# Patient Record
Sex: Male | Born: 1950 | Race: Black or African American | Hispanic: No | Smoking: Former smoker
Health system: Southern US, Community
[De-identification: ages and names within clinical notes are randomized; demographics above are authoritative.]

## PROBLEM LIST (undated history)

## (undated) DIAGNOSIS — Z800 Family history of malignant neoplasm of digestive organs: Secondary | ICD-10-CM

## (undated) DIAGNOSIS — D369 Benign neoplasm, unspecified site: Secondary | ICD-10-CM

## (undated) DIAGNOSIS — F319 Bipolar disorder, unspecified: Secondary | ICD-10-CM

## (undated) DIAGNOSIS — IMO0001 Reserved for inherently not codable concepts without codable children: Secondary | ICD-10-CM

## (undated) DIAGNOSIS — C19 Malignant neoplasm of rectosigmoid junction: Secondary | ICD-10-CM

## (undated) DIAGNOSIS — K5790 Diverticulosis of intestine, part unspecified, without perforation or abscess without bleeding: Secondary | ICD-10-CM

## (undated) DIAGNOSIS — G4730 Sleep apnea, unspecified: Secondary | ICD-10-CM

## (undated) DIAGNOSIS — F419 Anxiety disorder, unspecified: Secondary | ICD-10-CM

## (undated) DIAGNOSIS — I219 Acute myocardial infarction, unspecified: Secondary | ICD-10-CM

## (undated) DIAGNOSIS — E039 Hypothyroidism, unspecified: Secondary | ICD-10-CM

## (undated) DIAGNOSIS — I639 Cerebral infarction, unspecified: Secondary | ICD-10-CM

## (undated) DIAGNOSIS — E119 Type 2 diabetes mellitus without complications: Secondary | ICD-10-CM

## (undated) DIAGNOSIS — E782 Mixed hyperlipidemia: Secondary | ICD-10-CM

## (undated) DIAGNOSIS — M1990 Unspecified osteoarthritis, unspecified site: Secondary | ICD-10-CM

## (undated) DIAGNOSIS — J189 Pneumonia, unspecified organism: Secondary | ICD-10-CM

## (undated) DIAGNOSIS — I10 Essential (primary) hypertension: Secondary | ICD-10-CM

## (undated) DIAGNOSIS — Z87828 Personal history of other (healed) physical injury and trauma: Secondary | ICD-10-CM

## (undated) DIAGNOSIS — M51369 Other intervertebral disc degeneration, lumbar region without mention of lumbar back pain or lower extremity pain: Secondary | ICD-10-CM

## (undated) DIAGNOSIS — Z8673 Personal history of transient ischemic attack (TIA), and cerebral infarction without residual deficits: Secondary | ICD-10-CM

## (undated) DIAGNOSIS — M549 Dorsalgia, unspecified: Secondary | ICD-10-CM

## (undated) DIAGNOSIS — I2510 Atherosclerotic heart disease of native coronary artery without angina pectoris: Secondary | ICD-10-CM

## (undated) DIAGNOSIS — K635 Polyp of colon: Secondary | ICD-10-CM

## (undated) DIAGNOSIS — D126 Benign neoplasm of colon, unspecified: Secondary | ICD-10-CM

## (undated) DIAGNOSIS — M5136 Other intervertebral disc degeneration, lumbar region: Secondary | ICD-10-CM

## (undated) HISTORY — DX: Diverticulosis of intestine, part unspecified, without perforation or abscess without bleeding: K57.90

## (undated) HISTORY — DX: Benign neoplasm, unspecified site: D36.9

## (undated) HISTORY — PX: CARPAL TUNNEL RELEASE: SHX101

## (undated) HISTORY — DX: Mixed hyperlipidemia: E78.2

## (undated) HISTORY — DX: Cerebral infarction, unspecified: I63.9

## (undated) HISTORY — DX: Malignant neoplasm of rectosigmoid junction: C19

## (undated) HISTORY — DX: Polyp of colon: K63.5

## (undated) HISTORY — DX: Essential (primary) hypertension: I10

## (undated) HISTORY — PX: EYE SURGERY: SHX253

## (undated) HISTORY — DX: Hypothyroidism, unspecified: E03.9

## (undated) HISTORY — DX: Personal history of transient ischemic attack (TIA), and cerebral infarction without residual deficits: Z86.73

## (undated) HISTORY — PX: CIRCUMCISION: SUR203

## (undated) HISTORY — DX: Atherosclerotic heart disease of native coronary artery without angina pectoris: I25.10

## (undated) HISTORY — DX: Personal history of other (healed) physical injury and trauma: Z87.828

## (undated) HISTORY — DX: Type 2 diabetes mellitus without complications: E11.9

## (undated) HISTORY — PX: ABDOMINAL SURGERY: SHX537

## (undated) HISTORY — DX: Unspecified osteoarthritis, unspecified site: M19.90

## (undated) HISTORY — DX: Benign neoplasm of colon, unspecified: D12.6

## (undated) HISTORY — DX: Family history of malignant neoplasm of digestive organs: Z80.0

---

## 1989-03-16 DIAGNOSIS — Z8673 Personal history of transient ischemic attack (TIA), and cerebral infarction without residual deficits: Secondary | ICD-10-CM

## 1989-03-16 HISTORY — DX: Personal history of transient ischemic attack (TIA), and cerebral infarction without residual deficits: Z86.73

## 2000-10-28 ENCOUNTER — Emergency Department (HOSPITAL_COMMUNITY): Admission: EM | Admit: 2000-10-28 | Discharge: 2000-10-28 | Payer: Self-pay | Admitting: *Deleted

## 2000-12-24 ENCOUNTER — Ambulatory Visit (HOSPITAL_COMMUNITY): Admission: RE | Admit: 2000-12-24 | Discharge: 2000-12-24 | Payer: Self-pay | Admitting: *Deleted

## 2001-05-07 ENCOUNTER — Ambulatory Visit: Admission: RE | Admit: 2001-05-07 | Discharge: 2001-05-07 | Payer: Self-pay | Admitting: Internal Medicine

## 2002-07-31 ENCOUNTER — Inpatient Hospital Stay (HOSPITAL_COMMUNITY): Admission: EM | Admit: 2002-07-31 | Discharge: 2002-08-03 | Payer: Self-pay | Admitting: Internal Medicine

## 2002-07-31 ENCOUNTER — Encounter: Payer: Self-pay | Admitting: Internal Medicine

## 2004-03-30 ENCOUNTER — Encounter (INDEPENDENT_AMBULATORY_CARE_PROVIDER_SITE_OTHER): Payer: Self-pay | Admitting: *Deleted

## 2004-03-30 ENCOUNTER — Ambulatory Visit (HOSPITAL_COMMUNITY): Admission: RE | Admit: 2004-03-30 | Discharge: 2004-03-30 | Payer: Self-pay | Admitting: *Deleted

## 2004-03-30 HISTORY — PX: COLONOSCOPY: SHX174

## 2004-08-13 ENCOUNTER — Emergency Department (HOSPITAL_COMMUNITY): Admission: EM | Admit: 2004-08-13 | Discharge: 2004-08-13 | Payer: Self-pay | Admitting: Emergency Medicine

## 2004-09-17 ENCOUNTER — Encounter: Admission: RE | Admit: 2004-09-17 | Discharge: 2004-09-17 | Payer: Self-pay | Admitting: Orthopedic Surgery

## 2005-07-02 ENCOUNTER — Observation Stay (HOSPITAL_COMMUNITY): Admission: EM | Admit: 2005-07-02 | Discharge: 2005-07-03 | Payer: Self-pay | Admitting: Emergency Medicine

## 2005-10-26 ENCOUNTER — Emergency Department (HOSPITAL_COMMUNITY): Admission: EM | Admit: 2005-10-26 | Discharge: 2005-10-26 | Payer: Self-pay | Admitting: Emergency Medicine

## 2006-04-01 ENCOUNTER — Ambulatory Visit (HOSPITAL_COMMUNITY): Admission: RE | Admit: 2006-04-01 | Discharge: 2006-04-01 | Payer: Self-pay | Admitting: *Deleted

## 2006-04-01 ENCOUNTER — Encounter (INDEPENDENT_AMBULATORY_CARE_PROVIDER_SITE_OTHER): Payer: Self-pay | Admitting: *Deleted

## 2007-12-08 ENCOUNTER — Emergency Department (HOSPITAL_COMMUNITY): Admission: EM | Admit: 2007-12-08 | Discharge: 2007-12-08 | Payer: Self-pay | Admitting: Emergency Medicine

## 2008-06-27 ENCOUNTER — Emergency Department (HOSPITAL_COMMUNITY): Admission: EM | Admit: 2008-06-27 | Discharge: 2008-06-27 | Payer: Self-pay | Admitting: Emergency Medicine

## 2008-09-17 ENCOUNTER — Emergency Department (HOSPITAL_COMMUNITY): Admission: EM | Admit: 2008-09-17 | Discharge: 2008-09-17 | Payer: Self-pay | Admitting: Emergency Medicine

## 2008-11-29 ENCOUNTER — Emergency Department (HOSPITAL_COMMUNITY): Admission: EM | Admit: 2008-11-29 | Discharge: 2008-11-29 | Payer: Self-pay | Admitting: Emergency Medicine

## 2009-07-14 ENCOUNTER — Ambulatory Visit (HOSPITAL_COMMUNITY): Admission: RE | Admit: 2009-07-14 | Discharge: 2009-07-14 | Payer: Self-pay | Admitting: Ophthalmology

## 2009-09-24 ENCOUNTER — Emergency Department (HOSPITAL_COMMUNITY): Admission: EM | Admit: 2009-09-24 | Discharge: 2009-09-24 | Payer: Self-pay | Admitting: Emergency Medicine

## 2009-10-12 ENCOUNTER — Emergency Department (HOSPITAL_COMMUNITY): Admission: EM | Admit: 2009-10-12 | Discharge: 2009-10-12 | Payer: Self-pay | Admitting: Emergency Medicine

## 2009-12-24 ENCOUNTER — Emergency Department (HOSPITAL_COMMUNITY): Admission: EM | Admit: 2009-12-24 | Discharge: 2009-12-24 | Payer: Self-pay | Admitting: Emergency Medicine

## 2010-01-19 ENCOUNTER — Emergency Department (HOSPITAL_COMMUNITY): Admission: EM | Admit: 2010-01-19 | Discharge: 2010-01-19 | Payer: Self-pay | Admitting: Emergency Medicine

## 2010-07-31 ENCOUNTER — Emergency Department (HOSPITAL_COMMUNITY)
Admission: EM | Admit: 2010-07-31 | Discharge: 2010-07-31 | Payer: Self-pay | Source: Home / Self Care | Admitting: Emergency Medicine

## 2010-08-02 LAB — GLUCOSE, CAPILLARY: Glucose-Capillary: 306 mg/dL — ABNORMAL HIGH (ref 70–99)

## 2010-10-16 LAB — BASIC METABOLIC PANEL
BUN: 7 mg/dL (ref 6–23)
CO2: 25 mEq/L (ref 19–32)
Calcium: 8.8 mg/dL (ref 8.4–10.5)
Chloride: 102 mEq/L (ref 96–112)
Creatinine, Ser: 1 mg/dL (ref 0.4–1.5)
GFR calc Af Amer: >60 mL/min (ref >60)
GFR calc non Af Amer: >60 mL/min (ref >60)
Glucose, Bld: 236 mg/dL — ABNORMAL HIGH (ref 70–99)
Potassium: 3.7 mEq/L (ref 3.5–5.1)
Sodium: 136 mEq/L (ref 135–145)

## 2010-10-16 LAB — CBC
HCT: 42.8 % (ref 39.0–52.0)
Hemoglobin: 14.4 g/dL (ref 13.0–17.0)
MCHC: 33.5 g/dL (ref 30.0–36.0)
MCV: 84.3 fL (ref 78.0–100.0)
Platelets: 231 10*3/uL (ref 150–400)
RBC: 5.08 MIL/uL (ref 4.22–5.81)
RDW: 13.8 % (ref 11.5–15.5)
WBC: 6.3 10*3/uL (ref 4.0–10.5)

## 2010-10-16 LAB — GLUCOSE, CAPILLARY: Glucose-Capillary: 174 mg/dL — ABNORMAL HIGH (ref 70–99)

## 2010-10-26 LAB — URINALYSIS, ROUTINE W REFLEX MICROSCOPIC
Bilirubin Urine: NEGATIVE
Glucose, UA: >1000 mg/dL — AB
Hgb urine dipstick: NEGATIVE
Ketones, ur: NEGATIVE mg/dL
Leukocytes, UA: NEGATIVE
Nitrite: NEGATIVE
Protein, ur: NEGATIVE mg/dL
Specific Gravity, Urine: 1.01 (ref 1.005–1.030)
Urobilinogen, UA: 0.2 mg/dL (ref 0.0–1.0)
pH: 5.5 (ref 5.0–8.0)

## 2010-10-26 LAB — GLUCOSE, CAPILLARY: Glucose-Capillary: 332 mg/dL — ABNORMAL HIGH (ref 70–99)

## 2010-10-26 LAB — POCT CARDIAC MARKERS
CKMB, poc: <1 ng/mL — ABNORMAL LOW (ref 1.0–8.0)
Myoglobin, poc: 73.9 ng/mL (ref 12–200)
Troponin i, poc: <0.05 ng/mL (ref 0.00–0.09)

## 2010-10-26 LAB — BASIC METABOLIC PANEL
BUN: 8 mg/dL (ref 6–23)
CO2: 20 mEq/L (ref 19–32)
Calcium: 8.7 mg/dL (ref 8.4–10.5)
Chloride: 106 mEq/L (ref 96–112)
Creatinine, Ser: 0.88 mg/dL (ref 0.4–1.5)
GFR calc Af Amer: >60 mL/min (ref >60)
GFR calc non Af Amer: >60 mL/min (ref >60)
Glucose, Bld: 302 mg/dL — ABNORMAL HIGH (ref 70–99)
Potassium: 3.8 mEq/L (ref 3.5–5.1)
Sodium: 137 mEq/L (ref 135–145)

## 2010-10-26 LAB — DIFFERENTIAL
Basophils Absolute: 0 10*3/uL (ref 0.0–0.1)
Basophils Relative: 0 % (ref 0–1)
Eosinophils Absolute: 0.2 10*3/uL (ref 0.0–0.7)
Eosinophils Relative: 3 % (ref 0–5)
Lymphocytes Relative: 35 % (ref 12–46)
Lymphs Abs: 2.4 10*3/uL (ref 0.7–4.0)
Monocytes Absolute: 0.4 10*3/uL (ref 0.1–1.0)
Monocytes Relative: 6 % (ref 3–12)
Neutro Abs: 3.8 10*3/uL (ref 1.7–7.7)
Neutrophils Relative %: 56 % (ref 43–77)

## 2010-10-26 LAB — CBC
HCT: 41.4 % (ref 39.0–52.0)
Hemoglobin: 14.1 g/dL (ref 13.0–17.0)
MCHC: 34.2 g/dL (ref 30.0–36.0)
MCV: 83.5 fL (ref 78.0–100.0)
Platelets: 219 10*3/uL (ref 150–400)
RBC: 4.96 MIL/uL (ref 4.22–5.81)
RDW: 13.6 % (ref 11.5–15.5)
WBC: 6.8 10*3/uL (ref 4.0–10.5)

## 2010-10-26 LAB — URINE MICROSCOPIC-ADD ON
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
## 2010-12-01 NOTE — Procedures (Signed)
[place]  Patient:    Derrick [name], Derrick [name]                       MRN: [id number] Adm. Date:  [id number] Attending:  Sabino Gasser                           Procedure Report  PROCEDURE:  Upper endoscopy.  INDICATIONS:  GERD.  ANESTHESIA:  Demerol 100 mg, Versed 10 mg.  DESCRIPTION OF PROCEDURE:  With patient mildly sedated in the left lateral decubitus position, the Olympus videoscopic endoscope was inserted in the mouth, passed under direct vision through the esophagus.  Distal esophagus showed an inflammatory polyp.  It was photographed.  There was also a question of Barretts esophagus seen, photographed, and biopsied along with the polyp. We entered into the stomach.  Fundus, body, antrum all visualized.  There was some erythema of the antrum, which was biopsied.  Duodenal bulb, second portion of the duodenum well-visualized.  Those appeared normal.  From this point, the endoscope was slowly withdrawn, taking circumferential views of the entire duodenal mucosa until the endoscope had been pulled back and the stomach placed in retroflexion to view the stomach from below, and the GE junction was widely patent, allowing the endoscope in retroflexed position to be pulled all the way up into the esophagus.  Photograph taken.  The endoscope was then straight passed back into the stomach, straightened, and withdrawn, taking circumferential views of the remaining gastric and esophageal mucosa, which otherwise appeared normal.  Patients vital signs and pulse oximetry remained stable.  The patient tolerated the procedure well without apparent complications.  FINDINGS: 1. Erythema of antrum, biopsied. 2. Inflammatory-appearing polyp of the distal esophagus-proximal stomach,    photographed and biopsied. 3. A question of Barretts esophagus, biopsied.  PLAN:  Await biopsy report.  The patient will call me with results and follow up with me as an outpatient.   Proceed to colonoscopy as planned. DD:  12/24/00 TD:  12/24/00 Job: [id number]

## 2010-12-01 NOTE — Procedures (Signed)
[place]  Patient:    Derrick [name], Derrick [name]                       MRN: [id number] Adm. Date:  [id number] Attending:  Sabino Gasser                           Procedure Report  PROCEDURE:  Colonoscopy with polypectomy and biopsy.  INDICATIONS:  Colon polyps.  ANESTHESIA:  Demerol 25 mg, Versed 12.5 mg extra.  DESCRIPTION OF PROCEDURE:  With patient mildly sedated in the left lateral decubitus position, subsequently rolled to his back, the Olympus videoscopic colonoscope was inserted in the rectum after rectal examination was attempted and passed under direct vision to the cecum, identified by the ileocecal valve and appendiceal orifice, both of which were photographed.  From this point, the colonoscope was slowly withdrawn, taking circumferential views of the entire colonic mucosa, stopping first in the ascending colon just distal to the ileocecal valve, where a number of polyps were seen, photographed, and removed using snare cautery technique or hot biopsy forceps technique.  The tissue was all retrieved for pathology.  After clearing the cecum and ascending colon, the colonoscope was again further withdrawn, stopping only then in the splenic flexure area, where two large polyps were once again seen. Photographs were taken, and these were also removed using hot biopsy forceps technique or snare cautery technique.  All of the tissue was retrieved. Subsequently the next place we stopped was the sigmoid colon, where two adjacent polyps were seen.  They were quite large, well over a centimeter each.  Both were on a stalk, and both were removed using snare cautery technique.  Tissue was retrieved for pathology with a Dutch Quint. The endoscope was then reinserted to this level and slowly withdrawn, taking circumferential views of the remaining colonic mucosa, which otherwise appeared normal in direct and retroflex view.  The endoscope was  then straightened and withdrawn.  The patients vital signs and pulse oximetry remained stable.  The patient tolerated the procedure well without apparent complications.  FINDINGS:  Multiple polyps scattered throughout the colon, from the splenic flexure, the descending colon, and the sigmoid colon area at approximately 60 cm from the anal verge.  PLAN:  Await biopsy report.  Patient will call me for results and follow up with me as an outpatient. DD:  12/24/00 TD:  12/24/00 Job: [id number]

## 2010-12-01 NOTE — Op Note (Signed)
Derrick [name], MIJANGOS NO.:  [id number]   MEDICAL RECORD NO.:  [id number]          PATIENT TYPE:  AMB   LOCATION:  ENDO                         FACILITY:  [facility]   PHYSICIAN:  Georgiana Spinner, M.D.    DATE OF BIRTH:  1950-12-21   DATE OF PROCEDURE:  04/01/2006  DATE OF DISCHARGE:                                 OPERATIVE REPORT   PROCEDURE:  Colonoscopy.   INDICATIONS:  Colon polyps.   ANESTHESIA:  Demerol 50, Versed 5 mg.   PROCEDURE:  With patient mildly sedated in the left lateral decubitus  position, a rectal examination was attempted.  It was unremarkable to my  examination.  Subsequently, the Olympus videoscopic colonoscope was inserted  into the rectum and passed under direct vision to the cecum identified by  the ileocecal valve and appendiceal orifice.  Prep was suboptimal in that  there were areas of solid stool that was tenacious and difficult to wash and  suction but we did the best we could as we withdrew, taking circumferential  views of colonic mucosa as we went through to the rectum, stopping only in  the hepatic flexure area where there were 2 polyps seen.  One was removed  using snare cautery technique, the other with hot biopsy forceps technique,  both with a similar 20/200 blended current.  The endoscope was then, as  noted, withdrawn all the way to the rectum which appeared normal under  direct vision and on retroflex view.  The endoscope was straightened and  withdrawn.  Patient's vital signs, pulse oximeter, remained stable.  Patient  tolerated the procedure well without apparent complications.   FINDINGS:  Polyps as described above.  Await biopsy report.  Patient will  call me for results and follow up with me as an outpatient.           ______________________________  Georgiana Spinner, M.D.     GMO/MEDQ  D:  04/01/2006  T:  04/01/2006  Job:  [id number]
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
## 2010-12-01 NOTE — Op Note (Signed)
NAME:  Derrick [name], Derrick [name] NO.:  [id number]   MEDICAL RECORD NO.:  [id number]                   PATIENT TYPE:  AMB   LOCATION:  ENDO                                 FACILITY:  [facility]   PHYSICIAN:  Georgiana Spinner, M.D.                 DATE OF BIRTH:  August 16, 1950   DATE OF PROCEDURE:  03/30/2004  DATE OF DISCHARGE:                                 OPERATIVE REPORT   PROCEDURE:  Colonoscopy with polypectomy and biopsy.   ENDOSCOPIST:  Georgiana Spinner, M.D.   INDICATIONS:  Polyp.   ANESTHESIA:  Demerol 100 mg, Versed 6 mg.   DESCRIPTION OF PROCEDURE:  With the patient mildly sedated in the left  lateral decubitus position, the Olympus videoscopic colonoscope was inserted  in the rectum and passed under direct vision to the cecum, identified by  ileocecal valve and appendiceal orifice both of which were photographed.  From this point, the colonoscope was slowly withdrawn taking circumferential  views of the colonic mucosa stopping in the descending colon, where a polyp  was seen, photographed and removed, first using snare technique.  However,  it was pulled through without cautery.  Therefore, I used the hot biopsy  forceps after suctioning the polyp into the endoscope trap and tried to burn  this area.  However, it was defective and I then had to pull this and use  the tip of the snare once again to cauterize this tissue.  The endoscope was  then withdrawn all the way to the rectum which appeared normal on  retroflexed and direct view.  The endoscope was straightened and withdrawn.  The patient's vital signs and pulse oximeter remained stable.  The patient  tolerated the procedure well without apparent complications.   FINDINGS:  Somewhat suboptimal prep in that there was liquidy tenacious  brown stool that was difficult to suction but no gross lesions were seen  other than the polyp in the descending colon which was removed as described  above.   PLAN:  1.   Await biopsy report.  2.  The patient will call me for the results and follow up with me as an      outpatient.                                               Georgiana Spinner, M.D.    GMO/MEDQ  D:  03/30/2004  T:  03/30/2004  Job:  [id number]

## 2010-12-28 ENCOUNTER — Emergency Department (HOSPITAL_COMMUNITY)
Admission: EM | Admit: 2010-12-28 | Discharge: 2010-12-28 | Disposition: A | Discharge status: Home / Self Care | Payer: Medicare Other | Attending: Emergency Medicine | Admitting: Emergency Medicine

## 2010-12-28 DIAGNOSIS — Z79899 Other long term (current) drug therapy: Secondary | ICD-10-CM | POA: Insufficient documentation

## 2010-12-28 DIAGNOSIS — M5430 Sciatica, unspecified side: Secondary | ICD-10-CM | POA: Insufficient documentation

## 2010-12-28 DIAGNOSIS — I2510 Atherosclerotic heart disease of native coronary artery without angina pectoris: Secondary | ICD-10-CM | POA: Insufficient documentation

## 2010-12-28 DIAGNOSIS — E039 Hypothyroidism, unspecified: Secondary | ICD-10-CM | POA: Insufficient documentation

## 2010-12-28 DIAGNOSIS — I252 Old myocardial infarction: Secondary | ICD-10-CM | POA: Insufficient documentation

## 2010-12-28 DIAGNOSIS — E119 Type 2 diabetes mellitus without complications: Secondary | ICD-10-CM | POA: Insufficient documentation

## 2010-12-28 DIAGNOSIS — I10 Essential (primary) hypertension: Secondary | ICD-10-CM | POA: Insufficient documentation

## 2011-01-25 ENCOUNTER — Other Ambulatory Visit (HOSPITAL_COMMUNITY): Payer: Self-pay | Admitting: Physical Medicine and Rehabilitation

## 2011-01-25 DIAGNOSIS — R52 Pain, unspecified: Secondary | ICD-10-CM

## 2011-01-29 ENCOUNTER — Ambulatory Visit (HOSPITAL_COMMUNITY): Admission: RE | Admit: 2011-01-29 | Payer: Medicare Other | Source: Ambulatory Visit

## 2011-02-09 ENCOUNTER — Ambulatory Visit (HOSPITAL_COMMUNITY)
Admission: RE | Admit: 2011-02-09 | Discharge: 2011-02-09 | Disposition: A | Discharge status: Home / Self Care | Payer: Medicare Other | Source: Ambulatory Visit | Attending: Physical Medicine and Rehabilitation | Admitting: Physical Medicine and Rehabilitation

## 2011-02-09 DIAGNOSIS — M79609 Pain in unspecified limb: Secondary | ICD-10-CM | POA: Insufficient documentation

## 2011-02-09 DIAGNOSIS — M545 Low back pain, unspecified: Secondary | ICD-10-CM | POA: Insufficient documentation

## 2011-02-09 DIAGNOSIS — R52 Pain, unspecified: Secondary | ICD-10-CM

## 2011-02-09 DIAGNOSIS — M51379 Other intervertebral disc degeneration, lumbosacral region without mention of lumbar back pain or lower extremity pain: Secondary | ICD-10-CM | POA: Insufficient documentation

## 2011-02-09 DIAGNOSIS — M5137 Other intervertebral disc degeneration, lumbosacral region: Secondary | ICD-10-CM | POA: Insufficient documentation

## 2011-02-09 DIAGNOSIS — M5126 Other intervertebral disc displacement, lumbar region: Secondary | ICD-10-CM | POA: Insufficient documentation

## 2011-04-20 LAB — STREP A DNA PROBE: Group A Strep Probe: NEGATIVE

## 2011-04-20 LAB — RAPID STREP SCREEN (MED CTR MEBANE ONLY): Streptococcus, Group A Screen (Direct): NEGATIVE

## 2012-01-28 ENCOUNTER — Emergency Department (HOSPITAL_COMMUNITY)
Admission: EM | Admit: 2012-01-28 | Discharge: 2012-01-28 | Disposition: A | Discharge status: Home / Self Care | Payer: Medicare Other | Attending: Emergency Medicine | Admitting: Emergency Medicine

## 2012-01-28 ENCOUNTER — Encounter (HOSPITAL_COMMUNITY): Payer: Self-pay | Admitting: *Deleted

## 2012-01-28 DIAGNOSIS — W268XXA Contact with other sharp object(s), not elsewhere classified, initial encounter: Secondary | ICD-10-CM | POA: Insufficient documentation

## 2012-01-28 DIAGNOSIS — E079 Disorder of thyroid, unspecified: Secondary | ICD-10-CM | POA: Insufficient documentation

## 2012-01-28 DIAGNOSIS — Y92009 Unspecified place in unspecified non-institutional (private) residence as the place of occurrence of the external cause: Secondary | ICD-10-CM | POA: Insufficient documentation

## 2012-01-28 DIAGNOSIS — S61209A Unspecified open wound of unspecified finger without damage to nail, initial encounter: Secondary | ICD-10-CM | POA: Insufficient documentation

## 2012-01-28 DIAGNOSIS — S61011A Laceration without foreign body of right thumb without damage to nail, initial encounter: Secondary | ICD-10-CM

## 2012-01-28 DIAGNOSIS — S60511A Abrasion of right hand, initial encounter: Secondary | ICD-10-CM

## 2012-01-28 DIAGNOSIS — I10 Essential (primary) hypertension: Secondary | ICD-10-CM | POA: Insufficient documentation

## 2012-01-28 DIAGNOSIS — E119 Type 2 diabetes mellitus without complications: Secondary | ICD-10-CM | POA: Insufficient documentation

## 2012-01-28 HISTORY — DX: Dorsalgia, unspecified: M54.9

## 2012-01-28 LAB — GLUCOSE, CAPILLARY: Glucose-Capillary: 325 mg/dL — ABNORMAL HIGH (ref 70–99)

## 2012-01-28 MED ORDER — HYDROCODONE-ACETAMINOPHEN 5-325 MG PO TABS: 1.0000 | ORAL_TABLET | ORAL | Status: AC | PRN

## 2012-01-28 MED ORDER — LIDOCAINE HCL (PF) 2 % IJ SOLN
10.0000 mL | Freq: Once | INTRAMUSCULAR | Status: AC
  Administered 2012-01-28: 10 mL
  Filled 2012-01-28: qty 10

## 2012-01-28 MED ORDER — CEPHALEXIN 500 MG PO CAPS: 500.0000 mg | ORAL_CAPSULE | Freq: Four times a day (QID) | ORAL | Status: AC

## 2012-01-28 MED ORDER — BACITRACIN-NEOMYCIN-POLYMYXIN 400-5-5000 EX OINT
TOPICAL_OINTMENT | Freq: Once | CUTANEOUS | Status: AC
  Administered 2012-01-28: 1 via TOPICAL
  Filled 2012-01-28: qty 1

## 2012-01-28 NOTE — ED Notes (Signed)
Lac to rt hand with trying to take apart a microwave.

## 2012-01-28 NOTE — ED Notes (Signed)
Pt has a laceration to the base of his right thumb that he cut on a piece of metal on a microwave. Bleeding noted. Pressure dressing applied. Pt able to move his right thumb and feel touch sensation.

## 2012-01-28 NOTE — ED Notes (Signed)
Dressing applied with xeroform, bacitracin and gauze. No bleeding at this time.

## 2012-01-30 NOTE — ED Provider Notes (Signed)
History     CSN: [id number]  Arrival date & time 01/28/12  1711   First MD Initiated Contact with Patient 01/28/12 1737      Chief Complaint  Patient presents with  . Laceration    (Consider location/radiation/quality/duration/timing/severity/associated sxs/prior treatment) Patient is a 61 y.o. male presenting with skin laceration. The history is provided by the patient and a relative.  Laceration  The incident occurred less than 1 hour ago. The laceration is located on the right hand. The laceration is 2 cm in size. The laceration mechanism was a a metal edge (He was cut on the sharp edge of a microwave in his home.). The pain is at a severity of 8/10. The pain is moderate. The pain has been constant since onset. He reports no foreign bodies present. His tetanus status is UTD.    Past Medical History  Diagnosis Date  . Diabetes mellitus   . Hypertension   . Thyroid disease   . Back pain     Past Surgical History  Procedure Date  . Carpal tunnel release     History reviewed. No pertinent family history.  History  Substance Use Topics  . Smoking status: Never Smoker   . Smokeless tobacco: Current User    Types: Snuff  . Alcohol Use: No      Review of Systems  Constitutional: Negative for fever and chills.  HENT: Negative for facial swelling.   Respiratory: Negative for shortness of breath and wheezing.   Skin: Positive for wound.  Neurological: Negative for numbness.    Allergies  Review of patient's allergies indicates no known allergies.  Home Medications   Current Outpatient Rx  Name Route Sig Dispense Refill  . CEPHALEXIN 500 MG PO CAPS Oral Take 1 capsule (500 mg total) by mouth 4 (four) times daily. 20 capsule 0  . HYDROCODONE-ACETAMINOPHEN 5-325 MG PO TABS Oral Take 1 tablet by mouth every 4 (four) hours as needed for pain. 20 tablet 0    BP 129/87  Pulse 92  Temp 98.1 F (36.7 C) (Oral)  Resp 16  Ht 5\' 8"  (1.727 m)  Wt 240 lb (108.863 kg)   BMI 36.49 kg/m2  SpO2 98%  Physical Exam  Constitutional: He is oriented to person, place, and time. He appears well-developed and well-nourished.  HENT:  Head: Normocephalic.  Cardiovascular: Normal rate.   Pulmonary/Chest: Effort normal.  Musculoskeletal: He exhibits tenderness.  Neurological: He is alert and oriented to person, place, and time. No sensory deficit.  Skin: Laceration noted.       2 cm laceration through volar fatpad of right proximal index finger,  Wound with continual slow oozing blood.  He also has a hemostatic deep abrasion which is triangle shaped,  1 cm x 2 cm on his volar right palm.  Pt has FROM of fingers,  Less than 3 sec cap refill in fingers.    ED Course  Procedures (including critical care time)  Labs Reviewed  GLUCOSE, CAPILLARY - Abnormal; Notable for the following:    Glucose-Capillary 325 (*)     All other components within normal limits  LAB REPORT - SCANNED   No results found.   1. Laceration of right thumb   2. Abrasion of right hand    LACERATION REPAIR Performed by: Burgess Amor Authorized by: Burgess Amor Consent: Verbal consent obtained. Risks and benefits: risks, benefits and alternatives were discussed Consent given by: patient Patient identity confirmed: provided demographic data Prepped and Draped in normal  sterile fashion Wound explored  Laceration Location: right index finger Laceration Length: 2 cm  No Foreign Bodies seen or palpated  Anesthesia: digital block Local anesthetic: lidocaine 2% without epinephrine  Anesthetic total:2 ml  Irrigation method: syringe Amount of cleaning: copious with wound cleanser spray and NS Skin closure: ethilon 4-0  Number of sutures: 7  Technique: simple interrupted  Patient tolerance: Patient tolerated the procedure well with no immediate complications.  Abrasion on hand cleansed thoroughly with wound cleanser spray and NS,  Xeroform and sterole 4x4's followed by cling for  padded dressing.  MDM  Pt tolerated well.  Suture removal in 10 days.  Pt placed on keflex since wound was fairly dirty and on the hand.  Pt diabetic.  Discussed need for better blood glucose control,  Pt states his cbg is never below 200.  He is scheduled to see his pcp in several weeks.  Advised close watch for signs of infection in wound.        Burgess Amor, [place] 01/30/12 1652

## 2012-01-31 NOTE — ED Provider Notes (Signed)
Medical screening examination/treatment/procedure(s) were performed by non-physician practitioner and as supervising physician I was immediately available for consultation/collaboration.   Laray Anger, DO 01/31/12 [phone]

## 2012-03-18 ENCOUNTER — Encounter: Payer: Self-pay | Admitting: Urgent Care

## 2012-03-18 ENCOUNTER — Encounter: Payer: Self-pay | Admitting: Specialist

## 2012-03-19 ENCOUNTER — Encounter: Payer: Self-pay | Admitting: Urgent Care

## 2012-03-19 ENCOUNTER — Other Ambulatory Visit: Payer: Self-pay | Admitting: Internal Medicine

## 2012-03-19 ENCOUNTER — Ambulatory Visit (INDEPENDENT_AMBULATORY_CARE_PROVIDER_SITE_OTHER): Payer: Medicare Other | Admitting: Urgent Care

## 2012-03-19 VITALS — BP 145/93 | HR 75 | Temp 97.4°F | Ht 68.0 in | Wt 243.0 lb

## 2012-03-19 DIAGNOSIS — Z86010 Personal history of colonic polyps: Secondary | ICD-10-CM

## 2012-03-19 DIAGNOSIS — E119 Type 2 diabetes mellitus without complications: Secondary | ICD-10-CM

## 2012-03-19 DIAGNOSIS — Z79899 Other long term (current) drug therapy: Secondary | ICD-10-CM | POA: Insufficient documentation

## 2012-03-19 MED ORDER — PEG 3350-KCL-NA BICARB-NACL 420 G PO SOLR: 4000.0000 mL | ORAL | Status: AC

## 2012-03-19 NOTE — Progress Notes (Signed)
Referring Provider:  Dr Berger Primary Care Physician:  Berger, Martin J, MD Primary Gastroenterologist:  Dr. Rourk  Chief Complaint  Patient presents with  . Colonoscopy    HPI:  Derrick [name] is a 61 y.o. male here as a referral from Dr.Berger for surveillance colonoscopy with hx of adenomatous colon polyps.  He believes he had adenomatous colon polyps on colonoscopy in 2004.  Last colonoscopy by Dr. Orr in 2007 showed hyperplastic polyps. He believes he was recommended to have a followup colonoscopy in 3-5 years.   Denies any lower GI symptoms including constipation, diarrhea, rectal bleeding, melena or weight loss.   Denies any upper GI symptoms including heartburn, indigestion, nausea, vomiting, dysphagia, odynophagia or anorexia.  He is diabetic and on insulin. He is taking Plavix for history of coronary artery disease.  Past Medical History  Diagnosis Date  . Diabetes mellitus   . Hypertension   . Thyroid disease   . Back pain   . Arthritis   . Glaucoma   . Heart attack   . Heart disease   . High cholesterol   . Stroke late 1990  . CAD (coronary artery disease)   . Adenomatous polyp of colon     pt thinks 2004? Dr Orr    Past Surgical History  Procedure Date  . Carpal tunnel release     twice  . Eyes   . Cardiac catheterization   . Colonoscopy 03/30/2004    Orr- 2 hyperplastic polyps removed  . Circumcision     Current Outpatient Prescriptions  Medication Sig Dispense Refill  . amLODipine (NORVASC) 5 MG tablet Take 5 mg by mouth daily.       . clonazePAM (KLONOPIN) 1 MG tablet Take 1 mg by mouth 2 (two) times daily as needed.      . clopidogrel (PLAVIX) 75 MG tablet Take 75 mg by mouth daily.      . escitalopram (LEXAPRO) 20 MG tablet Take 20 mg by mouth daily.      . glipiZIDE (GLUCOTROL) 10 MG tablet Take 10 mg by mouth 2 (two) times daily before a meal.      . HYDROcodone-acetaminophen (NORCO) 10-325 MG per tablet Take 1 tablet by mouth every 6 (six) hours as  needed.      . LANTUS 100 UNIT/ML injection Inject 55 Units into the skin at bedtime.       . metFORMIN (GLUCOPHAGE) 1000 MG tablet Take 1,000 mg by mouth daily with breakfast.       . nitroGLYCERIN (NITROLINGUAL) 0.4 MG/SPRAY spray Place 1 spray under the tongue as needed.      . simvastatin (ZOCOR) 40 MG tablet Take 40 mg by mouth every evening.      . valsartan-hydrochlorothiazide (DIOVAN-HCT) 160-12.5 MG per tablet Take 1 tablet by mouth daily.        Allergies as of 03/19/2012  . (No Known Allergies)    Family History:There is no known family history of colorectal carcinoma , liver disease, or inflammatory bowel disease.  Problem Relation Age of Onset  . Diabetes Mother   . Hypertension Mother   . Stroke Brother   . Coronary artery disease Father     History   Social History  . Marital Status: Legally Separated    Spouse Name: N/A    Number of Children: 12  . Years of Education: N/A   Occupational History  . disabled    Social History Main Topics  . Smoking status: Former Smoker --   1.0 packs/day for 20 years    Types: Cigarettes    Quit date: 03/19/1997  . Smokeless tobacco: Current User    Types: Snuff, Chew  . Alcohol Use: No  . Drug Use: No  . Sexually Active: Not on file   Other Topics Concern  . Not on file   Social History Narrative   Lives alone   Review of Systems: Gen: Denies any fever, chills, sweats, anorexia, fatigue, weakness, malaise, weight loss, and sleep disorder CV: Denies chest pain, angina, palpitations, syncope, orthopnea, PND, peripheral edema, and claudication. Resp: Denies dyspnea at rest, dyspnea with exercise, cough, sputum, wheezing, coughing up blood, and pleurisy. GI: Denies vomiting blood, jaundice, and fecal incontinence.   Denies dysphagia or odynophagia. GU : Denies urinary burning, blood in urine, urinary frequency, urinary hesitancy, nocturnal urination, and urinary incontinence. MS: Denies joint pain, limitation of  movement, and swelling, stiffness, low back pain, extremity pain. Denies muscle weakness, cramps, atrophy.  Derm: Denies rash, itching, dry skin, hives, moles, warts, or unhealing ulcers.  Psych: Denies depression, anxiety, memory loss, suicidal ideation, hallucinations, paranoia, and confusion. Heme: Denies bruising, bleeding, and enlarged lymph nodes. Neuro:  Denies any headaches, dizziness, paresthesias. Endo:  Denies any problems with DM, thyroid, adrenal function.  Physical Exam: BP 145/93  Pulse 75  Temp 97.4 F (36.3 C) (Temporal)  Ht 5' 8" (1.727 m)  Wt 243 lb (110.224 kg)  BMI 36.95 kg/m2 No LMP for male patient. General:   Alert,  Well-developed, well-nourished, pleasant and cooperative in NAD.  Accompanied by his daughter today. Head:  Normocephalic and atraumatic. Eyes:  Sclera clear, no icterus.   Conjunctiva pink. Ears:  Normal auditory acuity. Nose:  No deformity, discharge, or lesions. Mouth:  No deformity or lesions,oropharynx pink & moist. Neck:  Supple; no masses or thyromegaly. Lungs:  Clear throughout to auscultation.   No wheezes, crackles, or rhonchi. No acute distress. Heart:  Regular rate and rhythm; no murmurs, clicks, rubs,  or gallops. Abdomen:  Normal bowel sounds.  No bruits.  Soft, non-tender and non-distended without masses, hepatosplenomegaly or hernias noted.  No guarding or rebound tenderness.   Rectal:  Deferred. Msk:  Symmetrical without gross deformities. Normal posture. Pulses:  Normal pulses noted. Extremities:  No edema. Neurologic:  Alert and oriented x4;  grossly normal neurologically. Skin:  Intact without significant lesions or rashes. Lymph Nodes:  No significant cervical adenopathy. Psych:  Alert and cooperative. Normal mood and affect.  

## 2012-03-19 NOTE — Assessment & Plan Note (Addendum)
Patient on Plavix. See above

## 2012-03-19 NOTE — Progress Notes (Signed)
Faxed to PCP

## 2012-03-19 NOTE — Assessment & Plan Note (Signed)
Instructions given. See above.

## 2012-03-19 NOTE — Assessment & Plan Note (Signed)
Derrick [name] is a pleasant 61 y.o. male with history of adenomatous colon polyps in 2004. He is due for surveillance colonoscopy.  Denies any GI symptoms at this time. He is on Plavix for coronary artery disease.  We did discuss the fact that he is at a slightly higher risk of GI bleeding given the fact that he is on Plavix, however the benefits of remaining on the plavix outweigh the risk of bleeding. We discussed the life threatening nature of an embolic event. He agrees to remain on plavix for this procedure. He also understands that a second procedure may be required since he is on plavix if he shows signs of significant bleeding or is in need of significant intervention that cannot be performed while on plavix. Other risks discussed include reaction to the medication, perforation, and infection. He agrees with all the above and consent will be obtained.    Half glucophage & glucotrol the day prior to the colonoscopy (date of prep) 1/2 of your Lantus insulin 27 units the day before your procedure (date of prep) Hold diabetes medications/insulin day of procedure Bring all your medications and/or any insulin to the hospital the day of the procedure. Follow blood sugars, call us or your PCP if any problems.

## 2012-03-19 NOTE — Patient Instructions (Addendum)
Colonoscopy with Dr Jena Gauss Take half of your glucophage & glucotrol the day prior to the colonoscopy (date of prep) Take 1/2 of your Lantus insulin 27 units the day before your procedure (date of prep) Hold diabetes medications/insulin day of procedure Bring all your medications and/or any insulin to the hospital the day of the procedure. Follow blood sugars, call us or your PCP if any problems.

## 2012-04-14 ENCOUNTER — Encounter (HOSPITAL_COMMUNITY): Payer: Self-pay | Admitting: Pharmacy Technician

## 2012-04-16 ENCOUNTER — Encounter (HOSPITAL_COMMUNITY)
Admission: RE | Disposition: A | Discharge status: Home / Self Care | Payer: Self-pay | Source: Ambulatory Visit | Attending: Internal Medicine

## 2012-04-16 ENCOUNTER — Encounter (HOSPITAL_COMMUNITY): Payer: Self-pay | Admitting: *Deleted

## 2012-04-16 ENCOUNTER — Ambulatory Visit (HOSPITAL_COMMUNITY)
Admission: RE | Admit: 2012-04-16 | Discharge: 2012-04-16 | Disposition: A | Discharge status: Home / Self Care | Payer: Medicare Other | Source: Ambulatory Visit | Attending: Internal Medicine | Admitting: Internal Medicine

## 2012-04-16 DIAGNOSIS — Z09 Encounter for follow-up examination after completed treatment for conditions other than malignant neoplasm: Secondary | ICD-10-CM | POA: Insufficient documentation

## 2012-04-16 DIAGNOSIS — D126 Benign neoplasm of colon, unspecified: Secondary | ICD-10-CM | POA: Insufficient documentation

## 2012-04-16 DIAGNOSIS — Z860100 Personal history of colon polyps, unspecified: Secondary | ICD-10-CM | POA: Insufficient documentation

## 2012-04-16 DIAGNOSIS — D369 Benign neoplasm, unspecified site: Secondary | ICD-10-CM

## 2012-04-16 DIAGNOSIS — Z1211 Encounter for screening for malignant neoplasm of colon: Secondary | ICD-10-CM

## 2012-04-16 DIAGNOSIS — Z01812 Encounter for preprocedural laboratory examination: Secondary | ICD-10-CM | POA: Insufficient documentation

## 2012-04-16 DIAGNOSIS — E119 Type 2 diabetes mellitus without complications: Secondary | ICD-10-CM | POA: Insufficient documentation

## 2012-04-16 DIAGNOSIS — E7800 Pure hypercholesterolemia, unspecified: Secondary | ICD-10-CM | POA: Insufficient documentation

## 2012-04-16 DIAGNOSIS — K5730 Diverticulosis of large intestine without perforation or abscess without bleeding: Secondary | ICD-10-CM

## 2012-04-16 DIAGNOSIS — I10 Essential (primary) hypertension: Secondary | ICD-10-CM | POA: Insufficient documentation

## 2012-04-16 HISTORY — PX: COLONOSCOPY: SHX5424

## 2012-04-16 HISTORY — DX: Benign neoplasm, unspecified site: D36.9

## 2012-04-16 SURGERY — COLONOSCOPY
Anesthesia: Moderate Sedation

## 2012-04-16 MED ORDER — SPOT INK MARKER SYRINGE KIT
PACK | SUBMUCOSAL | Status: DC | PRN
  Administered 2012-04-16: 2 mL via SUBMUCOSAL

## 2012-04-16 MED ORDER — MIDAZOLAM HCL 5 MG/5ML IJ SOLN
INTRAMUSCULAR | Status: AC
  Filled 2012-04-16: qty 10

## 2012-04-16 MED ORDER — EPINEPHRINE HCL 1 MG/ML IJ SOLN
INTRAMUSCULAR | Status: DC | PRN
  Administered 2012-04-16: 1.5 mg via SUBCUTANEOUS

## 2012-04-16 MED ORDER — MEPERIDINE HCL 100 MG/ML IJ SOLN
INTRAMUSCULAR | Status: AC
  Filled 2012-04-16: qty 2

## 2012-04-16 MED ORDER — SODIUM CHLORIDE 0.45 % IV SOLN
INTRAVENOUS | Status: DC
  Administered 2012-04-16: 1000 mL via INTRAVENOUS

## 2012-04-16 MED ORDER — STERILE WATER FOR IRRIGATION IR SOLN
Status: DC | PRN
  Administered 2012-04-16: 08:00:00

## 2012-04-16 MED ORDER — MEPERIDINE HCL 100 MG/ML IJ SOLN
INTRAMUSCULAR | Status: DC | PRN
  Administered 2012-04-16: 25 mg via INTRAVENOUS
  Administered 2012-04-16: 25 mg
  Administered 2012-04-16: 25 mg via INTRAVENOUS

## 2012-04-16 MED ORDER — MIDAZOLAM HCL 5 MG/5ML IJ SOLN
INTRAMUSCULAR | Status: DC | PRN
  Administered 2012-04-16: 2 mg via INTRAVENOUS
  Administered 2012-04-16 (×3): 1 mg via INTRAVENOUS

## 2012-04-16 MED ORDER — EPINEPHRINE HCL 0.1 MG/ML IJ SOLN
INTRAMUSCULAR | Status: AC
  Filled 2012-04-16: qty 20

## 2012-04-16 NOTE — Interval H&P Note (Signed)
History and Physical Interval Note:  04/16/2012 7:38 AM  Derrick [name]  has presented today for surgery, with the diagnosis of hx of colon polyps  The various methods of treatment have been discussed with the patient and family. After consideration of risks, benefits and other options for treatment, the patient has consented to  Procedure(s) (LRB) with comments: COLONOSCOPY (N/A) - 7:30am as a surgical intervention .  The patient's history has been reviewed, patient examined, no change in status, stable for surgery.  I have reviewed the patient's chart and labs.  Questions were answered to the patient's satisfaction.     Eula Listen

## 2012-04-16 NOTE — H&P (View-Only) (Signed)
Referring Provider:  Dr Luster Landsberg Primary Care Physician:  Margorie Reeve, MD Primary Gastroenterologist:  Dr. Jena Gauss  Chief Complaint  Patient presents with  . Colonoscopy    HPI:  Derrick [name] is a 61 y.o. male here as a referral from Dr.Berger for surveillance colonoscopy with hx of adenomatous colon polyps.  He believes he had adenomatous colon polyps on colonoscopy in 2004.  Last colonoscopy by Dr. [name] in 2007 showed hyperplastic polyps. He believes he was recommended to have a followup colonoscopy in 3-5 years.   Denies any lower GI symptoms including constipation, diarrhea, rectal bleeding, melena or weight loss.   Denies any upper GI symptoms including heartburn, indigestion, nausea, vomiting, dysphagia, odynophagia or anorexia.  He is diabetic and on insulin. He is taking Plavix for history of coronary artery disease.  Past Medical History  Diagnosis Date  . Diabetes mellitus   . Hypertension   . Thyroid disease   . Back pain   . Arthritis   . Glaucoma   . Heart attack   . Heart disease   . High cholesterol   . Stroke late 1990  . CAD (coronary artery disease)   . Adenomatous polyp of colon     pt thinks 2004? Dr [name]    Past Surgical History  Procedure Date  . Carpal tunnel release     twice  . Eyes   . Cardiac catheterization   . Colonoscopy 03/30/2004    [name]- 2 hyperplastic polyps removed  . Circumcision     Current Outpatient Prescriptions  Medication Sig Dispense Refill  . amLODipine (NORVASC) 5 MG tablet Take 5 mg by mouth daily.       . clonazePAM (KLONOPIN) 1 MG tablet Take 1 mg by mouth 2 (two) times daily as needed.      . clopidogrel (PLAVIX) 75 MG tablet Take 75 mg by mouth daily.      [name] escitalopram (LEXAPRO) 20 MG tablet Take 20 mg by mouth daily.      [name] glipiZIDE (GLUCOTROL) 10 MG tablet Take 10 mg by mouth 2 (two) times daily before a meal.      . HYDROcodone-acetaminophen (NORCO) 10-325 MG per tablet Take 1 tablet by mouth every 6 (six) hours as  needed.      [name] LANTUS 100 UNIT/ML injection Inject 55 Units into the skin at bedtime.       . metFORMIN (GLUCOPHAGE) 1000 MG tablet Take 1,000 mg by mouth daily with breakfast.       . nitroGLYCERIN (NITROLINGUAL) 0.4 MG/SPRAY spray Place 1 spray under the tongue as needed.      . simvastatin (ZOCOR) 40 MG tablet Take 40 mg by mouth every evening.      . valsartan-hydrochlorothiazide (DIOVAN-HCT) 160-12.5 MG per tablet Take 1 tablet by mouth daily.        Allergies as of 03/19/2012  . (No Known Allergies)    Family History:There is no known family history of colorectal carcinoma , liver disease, or inflammatory bowel disease.  Problem Relation Age of Onset  . Diabetes Mother   . Hypertension Mother   . Stroke Brother   . Coronary artery disease Father     History   Social History  . Marital Status: Legally Separated    Spouse Name: N/A    Number of Children: 12  . Years of Education: N/A   Occupational History  . disabled    Social History Main Topics  . Smoking status: Former Smoker --  1.0 packs/day for 20 years    Types: Cigarettes    Quit date: 03/19/1997  . Smokeless tobacco: Current User    Types: Snuff, Chew  . Alcohol Use: No  . Drug Use: No  . Sexually Active: Not on file   Other Topics Concern  . Not on file   Social History Narrative   Lives alone   Review of Systems: Gen: Denies any fever, chills, sweats, anorexia, fatigue, weakness, malaise, weight loss, and sleep disorder CV: Denies chest pain, angina, palpitations, syncope, orthopnea, PND, peripheral edema, and claudication. Resp: Denies dyspnea at rest, dyspnea with exercise, cough, sputum, wheezing, coughing up blood, and pleurisy. GI: Denies vomiting blood, jaundice, and fecal incontinence.   Denies dysphagia or odynophagia. GU : Denies urinary burning, blood in urine, urinary frequency, urinary hesitancy, nocturnal urination, and urinary incontinence. MS: Denies joint pain, limitation of  movement, and swelling, stiffness, low back pain, extremity pain. Denies muscle weakness, cramps, atrophy.  Derm: Denies rash, itching, dry skin, hives, moles, warts, or unhealing ulcers.  Psych: Denies depression, anxiety, memory loss, suicidal ideation, hallucinations, paranoia, and confusion. Heme: Denies bruising, bleeding, and enlarged lymph nodes. Neuro:  Denies any headaches, dizziness, paresthesias. Endo:  Denies any problems with DM, thyroid, adrenal function.  Physical Exam: BP 145/93  Pulse 75  Temp 97.4 F (36.3 C) (Temporal)  Ht 5\' 8"  (1.727 m)  Wt 243 lb (110.224 kg)  BMI 36.95 kg/m2 No LMP for male patient. General:   Alert,  Well-developed, well-nourished, pleasant and cooperative in NAD.  Accompanied by his daughter today. Head:  Normocephalic and atraumatic. Eyes:  Sclera clear, no icterus.   Conjunctiva pink. Ears:  Normal auditory acuity. Nose:  No deformity, discharge, or lesions. Mouth:  No deformity or lesions,oropharynx pink & moist. Neck:  Supple; no masses or thyromegaly. Lungs:  Clear throughout to auscultation.   No wheezes, crackles, or rhonchi. No acute distress. Heart:  Regular rate and rhythm; no murmurs, clicks, rubs,  or gallops. Abdomen:  Normal bowel sounds.  No bruits.  Soft, non-tender and non-distended without masses, hepatosplenomegaly or hernias noted.  No guarding or rebound tenderness.   Rectal:  Deferred. Msk:  Symmetrical without gross deformities. Normal posture. Pulses:  Normal pulses noted. Extremities:  No edema. Neurologic:  Alert and oriented x4;  grossly normal neurologically. Skin:  Intact without significant lesions or rashes. Lymph Nodes:  No significant cervical adenopathy. Psych:  Alert and cooperative. Normal mood and affect.

## 2012-04-16 NOTE — Op Note (Signed)
[facility] [address] [place], [address]   COLONOSCOPY PROCEDURE REPORT  PATIENT: [name], Derrick  MR#:         [id number] BIRTHDATE: [birth year] , 61  yrs. old GENDER: Male ENDOSCOPIST: R.  Roetta Sessions, MD FACP [facility] REFERRED BY:  Dr. Luster Landsberg PROCEDURE DATE:  04/16/2012 PROCEDURE:     colonoscopy with snare polypectomy, hemostasis clipping, tattooing of the lesion  INDICATIONS: History colonic adenoma  INFORMED CONSENT:  The risks, benefits, alternatives and imponderables including but not limited to bleeding, perforation as well as the possibility of a missed lesion have been reviewed.  The potential for biopsy, lesion removal, etc. have also been discussed.  Questions have been answered.  All parties agreeable. Please see the history and physical in the medical record for more information.  MEDICATIONS: Versed 5 mg IV and Demerol 100 mg IV in divided doses.  DESCRIPTION OF PROCEDURE:  After a digital rectal exam was performed, the EC-3890Li ([id number])  colonoscope was advanced from the anus through the rectum and colon to the area of the cecum, ileocecal valve and appendiceal orifice.  The cecum was deeply intubated.  These structures were well-seen and photographed for the record.  From the level of the cecum and ileocecal valve, the scope was slowly and cautiously withdrawn.  The mucosal surfaces were carefully surveyed utilizing scope tip deflection to facilitate fold flattening as needed.  The scope was pulled down into the rectum where a thorough examination including retroflexion was performed.    FINDINGS:  marginal to poor preparation compromised examination. Normal-appearing rectal mucosa.scattered left-sided diverticula. Multiple ascending colon polyps, sessile and pedunculated, the largest being about 1.5 cm in very difficult to access in the vicinity of the hepatic flexure/descending segment. The remainder of colonic mucosa appeared  grossly normal.  THERAPEUTIC / DIAGNOSTIC MANEUVERS PERFORMED:  Multiple osteoporotic these performed, removing the polyps in the descending segment. One polypectomy site was clipped for concern of post polypectomy bleeding. The largest polypectomy site (the site of the most difficult access) did want to bleed a bit.  I did not have good positioning for clip placement of this lesion. In addition, the full extent of this lesion may not have been totally appreciated because of my approach and visualization. I injected a total of 15 cc of 1: 10,000 epinephrine. This lesion was also tattooed. There was good hemostasis achieved.  COMPLICATIONS: none  CECAL WITHDRAWAL TIME:  44 minutes  IMPRESSION:  Colonic diverticulosis. Multiple ascending polyps treated as described above.  RECOMMENDATIONS: Since Plavix was already held by the patient for 5 days (on his own), I recommend he continue on Plavix for 5 more days and resume this medication on October 7.  At a very minimum, the patient will need a short interval (i.e. 3 month) followup colonoscopy pending review of pathology report. No MRI until clip known to have passed.   _______________________________ eSigned:  R. Roetta Sessions, MD FACP [facility] 04/16/2012 9:10 AM   CC:    PATIENT NAME:  Derrick, [name] MR#: [id number]

## 2012-04-18 ENCOUNTER — Encounter: Payer: Self-pay | Admitting: Internal Medicine

## 2012-04-18 NOTE — Progress Notes (Unsigned)
Letter mailed to pt. Darl Pikes, please nic ov in 3 months to set up tcs.

## 2012-04-18 NOTE — Progress Notes (Unsigned)
Per RMR- Send letter to patient.  Send copy of letter with path to referring provider and PCP. Needs TCS in 3 months. Needs one half a day extra clear liquids and 3 ducalax tablets the night before the procedure and 2 enemas the morning of the procedure in addition to standard GoLYTELY prep. Needs office visit with extender to set all this up in about 3 months

## 2012-04-21 ENCOUNTER — Encounter (HOSPITAL_COMMUNITY): Payer: Self-pay | Admitting: Internal Medicine

## 2012-04-21 ENCOUNTER — Encounter (HOSPITAL_COMMUNITY): Payer: Self-pay | Admitting: Pharmacy Technician

## 2012-04-22 NOTE — Progress Notes (Signed)
Reminder in epic to follow up in 3 months to set up TCS °

## 2012-04-23 ENCOUNTER — Encounter (HOSPITAL_COMMUNITY): Payer: Self-pay

## 2012-04-23 ENCOUNTER — Encounter (HOSPITAL_COMMUNITY)
Admission: RE | Admit: 2012-04-23 | Discharge: 2012-04-23 | Disposition: A | Discharge status: Home / Self Care | Payer: Medicare Other | Source: Ambulatory Visit | Attending: Ophthalmology | Admitting: Ophthalmology

## 2012-04-23 ENCOUNTER — Other Ambulatory Visit: Payer: Self-pay

## 2012-04-23 LAB — BASIC METABOLIC PANEL
Chloride: 106 mEq/L (ref 96–112)
GFR calc Af Amer: >90 mL/min (ref >90)
Potassium: 4.1 mEq/L (ref 3.5–5.1)
Sodium: 139 mEq/L (ref 135–145)

## 2012-04-23 LAB — HEMOGLOBIN AND HEMATOCRIT, BLOOD: Hemoglobin: 13.7 g/dL (ref 13.0–17.0)

## 2012-04-23 NOTE — Progress Notes (Signed)
Pt has no working CBG meter @ home so PCP notified and asked to send prescription to [facility]. Nurse states that it will be sent today for pt. Left voice mail on pt's cell phone to make him aware that he would have meter at his pharmacy.

## 2012-04-23 NOTE — Patient Instructions (Addendum)
Your procedure is scheduled on:  Monday, 04/28/12  Report to [facility] at     1100 AM.  Call this number if you have problems the morning of surgery: [phone]   Remember:   Do not eat or drink   After Midnight.  Take these medicines the morning of surgery with A SIP OF WATER: lexapro, diovan, klonopin and norco if needed. Take 1/2 of your usual dose of Lantus the night before. ( 25 units)  Do not wear jewelry, make-up or nail polish.  Do not wear lotions, powders, or perfumes. You may wear deodorant.  Do not bring valuables to the hospital.  Contacts, dentures or bridgework may not be worn into surgery.     Patients discharged the day of surgery will not be allowed to drive home.  Name and phone number of your driver: family  Special Instructions: Use eye drops as directed.   Please read over the following fact sheets that you were given: Pain Booklet, Anesthesia Post-op Instructions and Care and Recovery After Surgery    Cataract Surgery  A cataract is a clouding of the lens of the eye. When a lens becomes cloudy, vision is reduced based on the degree and nature of the clouding. Surgery may be needed to improve vision. Surgery removes the cloudy lens and usually replaces it with a substitute lens (intraocular lens, IOL). LET YOUR EYE DOCTOR KNOW ABOUT:  Allergies to food or medicine.   Medicines taken including herbs, eyedrops, over-the-counter medicines, and creams.   Use of steroids (by mouth or creams).   Previous problems with anesthetics or numbing medicine.   History of bleeding problems or blood clots.   Previous surgery.   Other health problems, including diabetes and kidney problems.   Possibility of pregnancy, if this applies.  RISKS AND COMPLICATIONS  Infection.   Inflammation of the eyeball (endophthalmitis) that can spread to both eyes (sympathetic ophthalmia).   Poor wound healing.   If an IOL is inserted, it can later fall out of proper position. This  is very uncommon.   Clouding of the part of your eye that holds an IOL in place. This is called an "after-cataract." These are uncommon, but easily treated.  BEFORE THE PROCEDURE  Do not eat or drink anything except small amounts of water for 8 to 12 before your surgery, or as directed by your caregiver.   Unless you are told otherwise, continue any eyedrops you have been prescribed.   Talk to your primary caregiver about all other medicines that you take (both prescription and non-prescription). In some cases, you may need to stop or change medicines near the time of your surgery. This is most important if you are taking blood-thinning medicine.Do not stop medicines unless you are told to do so.   Arrange for someone to drive you to and from the procedure.   Do not put contact lenses in either eye on the day of your surgery.  PROCEDURE There is more than one method for safely removing a cataract. Your doctor can explain the differences and help determine which is best for you. Phacoemulsification surgery is the most common form of cataract surgery.  An injection is given behind the eye or eyedrops are given to make this a painless procedure.   A small cut (incision) is made on the edge of the clear, dome-shaped surface that covers the front of the eye (cornea).   A tiny probe is painlessly inserted into the eye. This device gives  off ultrasound waves that soften and break up the cloudy center of the lens. This makes it easier for the cloudy lens to be removed by suction.   An IOL may be implanted.   The normal lens of the eye is covered by a clear capsule. Part of that capsule is intentionally left in the eye to support the IOL.   Your surgeon may or may not use stitches to close the incision.  There are other forms of cataract surgery that require a larger incision and stiches to close the eye. This approach is taken in cases where the doctor feels that the cataract cannot be easily  removed using phacoemulsification. AFTER THE PROCEDURE  When an IOL is implanted, it does not need care. It becomes a permanent part of your eye and cannot be seen or felt.   Your doctor will schedule follow-up exams to check on your progress.   Review your other medicines with your doctor to see which can be resumed after surgery.   Use eyedrops or take medicine as prescribed by your doctor.  Document Released: 06/21/2011 Document Reviewed: 06/18/2011 [facility] Patient Information 2012 [place], [place].  PATIENT INSTRUCTIONS POST-ANESTHESIA  IMMEDIATELY FOLLOWING SURGERY:  Do not drive or operate machinery for the first twenty four hours after surgery.  Do not make any important decisions for twenty four hours after surgery or while taking narcotic pain medications or sedatives.  If you develop intractable nausea and vomiting or a severe headache please notify your doctor immediately.  FOLLOW-UP:  Please make an appointment with your surgeon as instructed. You do not need to follow up with anesthesia unless specifically instructed to do so.  WOUND CARE INSTRUCTIONS (if applicable):  Keep a dry clean dressing on the anesthesia/puncture wound site if there is drainage.  Once the wound has quit draining you may leave it open to air.  Generally you should leave the bandage intact for twenty four hours unless there is drainage.  If the epidural site drains for more than 36-48 hours please call the anesthesia department.  QUESTIONS?:  Please feel free to call your physician or the hospital operator if you have any questions, and they will be happy to assist you.

## 2012-04-25 MED ORDER — PHENYLEPHRINE HCL 2.5 % OP SOLN
OPHTHALMIC | Status: AC
  Filled 2012-04-25: qty 2

## 2012-04-25 MED ORDER — CYCLOPENTOLATE HCL 1 % OP SOLN
OPHTHALMIC | Status: AC
  Filled 2012-04-25: qty 2

## 2012-04-25 MED ORDER — LIDOCAINE HCL (PF) 1 % IJ SOLN
INTRAMUSCULAR | Status: AC
  Filled 2012-04-25: qty 2

## 2012-04-25 MED ORDER — NEOMYCIN-POLYMYXIN-DEXAMETH 3.5-10000-0.1 OP OINT
TOPICAL_OINTMENT | OPHTHALMIC | Status: AC
  Filled 2012-04-25: qty 3.5

## 2012-04-25 MED ORDER — TETRACAINE HCL 0.5 % OP SOLN
OPHTHALMIC | Status: AC
  Filled 2012-04-25: qty 2

## 2012-04-25 MED ORDER — LIDOCAINE HCL 3.5 % OP GEL
OPHTHALMIC | Status: AC
  Filled 2012-04-25: qty 5

## 2012-04-28 ENCOUNTER — Encounter (HOSPITAL_COMMUNITY): Payer: Self-pay | Admitting: Anesthesiology

## 2012-04-28 ENCOUNTER — Encounter (HOSPITAL_COMMUNITY)
Admission: RE | Disposition: A | Discharge status: Home / Self Care | Payer: Self-pay | Source: Ambulatory Visit | Attending: Ophthalmology

## 2012-04-28 ENCOUNTER — Ambulatory Visit (HOSPITAL_COMMUNITY)
Admission: RE | Admit: 2012-04-28 | Discharge: 2012-04-28 | Disposition: A | Discharge status: Home / Self Care | Payer: Medicare Other | Source: Ambulatory Visit | Attending: Ophthalmology | Admitting: Ophthalmology

## 2012-04-28 ENCOUNTER — Encounter (HOSPITAL_COMMUNITY): Payer: Self-pay | Admitting: *Deleted

## 2012-04-28 ENCOUNTER — Ambulatory Visit (HOSPITAL_COMMUNITY): Payer: Medicare Other | Admitting: Anesthesiology

## 2012-04-28 DIAGNOSIS — H25019 Cortical age-related cataract, unspecified eye: Secondary | ICD-10-CM | POA: Insufficient documentation

## 2012-04-28 DIAGNOSIS — Z01810 Encounter for preprocedural cardiovascular examination: Secondary | ICD-10-CM | POA: Insufficient documentation

## 2012-04-28 DIAGNOSIS — E119 Type 2 diabetes mellitus without complications: Secondary | ICD-10-CM | POA: Insufficient documentation

## 2012-04-28 DIAGNOSIS — Z01812 Encounter for preprocedural laboratory examination: Secondary | ICD-10-CM | POA: Insufficient documentation

## 2012-04-28 DIAGNOSIS — G4733 Obstructive sleep apnea (adult) (pediatric): Secondary | ICD-10-CM | POA: Insufficient documentation

## 2012-04-28 DIAGNOSIS — I10 Essential (primary) hypertension: Secondary | ICD-10-CM | POA: Insufficient documentation

## 2012-04-28 HISTORY — PX: CATARACT EXTRACTION W/PHACO: SHX586

## 2012-04-28 LAB — GLUCOSE, CAPILLARY: Glucose-Capillary: 107 mg/dL — ABNORMAL HIGH (ref 70–99)

## 2012-04-28 SURGERY — PHACOEMULSIFICATION, CATARACT, WITH IOL INSERTION
Anesthesia: Monitor Anesthesia Care | Site: Eye | Laterality: Right | Wound class: Clean

## 2012-04-28 MED ORDER — LACTATED RINGERS IV SOLN
INTRAVENOUS | Status: DC
  Administered 2012-04-28: 50 mL via INTRAVENOUS

## 2012-04-28 MED ORDER — POVIDONE-IODINE 5 % OP SOLN
OPHTHALMIC | Status: DC | PRN
  Administered 2012-04-28: 1 via OPHTHALMIC

## 2012-04-28 MED ORDER — EPINEPHRINE HCL 1 MG/ML IJ SOLN
INTRAMUSCULAR | Status: AC
  Filled 2012-04-28: qty 1

## 2012-04-28 MED ORDER — LIDOCAINE HCL (PF) 1 % IJ SOLN
INTRAOCULAR | Status: DC | PRN
  Administered 2012-04-28: 13:00:00 via OPHTHALMIC

## 2012-04-28 MED ORDER — LIDOCAINE HCL 3.5 % OP GEL: 1.0000 "application " | Freq: Once | OPHTHALMIC | Status: DC

## 2012-04-28 MED ORDER — MIDAZOLAM HCL 2 MG/2ML IJ SOLN
1.0000 mg | INTRAMUSCULAR | Status: DC | PRN
  Administered 2012-04-28: 2 mg via INTRAVENOUS

## 2012-04-28 MED ORDER — CYCLOPENTOLATE-PHENYLEPHRINE 0.2-1 % OP SOLN: 1.0000 [drp] | OPHTHALMIC | Status: AC

## 2012-04-28 MED ORDER — EPINEPHRINE HCL 1 MG/ML IJ SOLN
INTRAOCULAR | Status: DC | PRN
  Administered 2012-04-28: 13:00:00

## 2012-04-28 MED ORDER — PHENYLEPHRINE HCL 2.5 % OP SOLN
1.0000 [drp] | OPHTHALMIC | Status: AC
  Administered 2012-04-28 (×3): 1 [drp] via OPHTHALMIC

## 2012-04-28 MED ORDER — MIDAZOLAM HCL 2 MG/2ML IJ SOLN
INTRAMUSCULAR | Status: AC
  Filled 2012-04-28: qty 2

## 2012-04-28 MED ORDER — PROVISC 10 MG/ML IO SOLN
INTRAOCULAR | Status: DC | PRN
  Administered 2012-04-28: 8.5 mg via INTRAOCULAR

## 2012-04-28 MED ORDER — CYCLOPENTOLATE HCL 1 % OP SOLN
1.0000 [drp] | OPHTHALMIC | Status: DC
  Administered 2012-04-28 (×3): 1 [drp] via OPHTHALMIC

## 2012-04-28 MED ORDER — TETRACAINE HCL 0.5 % OP SOLN
1.0000 [drp] | OPHTHALMIC | Status: AC
  Administered 2012-04-28 (×3): 1 [drp] via OPHTHALMIC

## 2012-04-28 MED ORDER — BSS IO SOLN
INTRAOCULAR | Status: DC | PRN
  Administered 2012-04-28: 15 mL via INTRAOCULAR

## 2012-04-28 MED ORDER — NEOMYCIN-POLYMYXIN-DEXAMETH 0.1 % OP OINT
TOPICAL_OINTMENT | OPHTHALMIC | Status: DC | PRN
  Administered 2012-04-28: 1 via OPHTHALMIC

## 2012-04-28 SURGICAL SUPPLY — 11 items
CLOTH BEACON ORANGE TIMEOUT ST (SAFETY) ×1 IMPLANT
EYE SHIELD UNIVERSAL CLEAR (GAUZE/BANDAGES/DRESSINGS) ×1 IMPLANT
GLOVE BIOGEL PI IND STRL 6.5 (GLOVE) IMPLANT
GLOVE BIOGEL PI INDICATOR 6.5 (GLOVE) ×1
GLOVE EXAM NITRILE MD LF STRL (GLOVE) ×1 IMPLANT
PAD ARMBOARD 7.5X6 YLW CONV (MISCELLANEOUS) ×1 IMPLANT
SIGHTPATH CAT PROC W REG LENS (Ophthalmic Related) ×2 IMPLANT
SYR TB 1ML LL NO SAFETY (SYRINGE) ×1 IMPLANT
TAPE SURG TRANSPORE 1 IN (GAUZE/BANDAGES/DRESSINGS) IMPLANT
TAPE SURGICAL TRANSPORE 1 IN (GAUZE/BANDAGES/DRESSINGS) ×1
WATER STERILE IRR 250ML POUR (IV SOLUTION) ×1 IMPLANT

## 2012-04-28 NOTE — Anesthesia Procedure Notes (Signed)
Procedure Name: MAC Date/Time: 04/28/2012 12:33 PM Performed by: Franco Nones Pre-anesthesia Checklist: Patient identified, Emergency Drugs available, Suction available, Timeout performed and Patient being monitored Patient Re-evaluated:Patient Re-evaluated prior to inductionOxygen Delivery Method: Nasal Cannula

## 2012-04-28 NOTE — H&P (Signed)
I have reviewed the H&P, the patient was re-examined, and I have identified no interval changes in medical condition and plan of care since the history and physical of record  

## 2012-04-28 NOTE — Brief Op Note (Signed)
Pre-Op Dx: Cataract OD Post-Op Dx: Cataract OD Surgeon: Ashland Wiseman Anesthesia: Topical with MAC Surgery: Cataract Extraction with Intraocular lens Implant OD Implant: Lenstec, Model Softec HD Blood Loss: None Specimen: None Complications: None 

## 2012-04-28 NOTE — Progress Notes (Signed)
Reported off to [place]

## 2012-04-28 NOTE — Transfer of Care (Signed)
Immediate Anesthesia Transfer of Care Note  Patient: Derrick [name]  Procedure(s) Performed: Procedure(s) (LRB): CATARACT EXTRACTION PHACO AND INTRAOCULAR LENS PLACEMENT (IOC) (Right)  Patient Location: Shortstay  Anesthesia Type: MAC  Level of Consciousness: awake  Airway & Oxygen Therapy: Patient Spontanous Breathing   Post-op Assessment: Report given to PACU RN, Post -op Vital signs reviewed and stable and Patient moving all extremities  Post vital signs: Reviewed and stable  Complications: No apparent anesthesia complications

## 2012-04-28 NOTE — Anesthesia Postprocedure Evaluation (Signed)
  Anesthesia Post-op Note  Patient: Derrick [name]  Procedure(s) Performed: Procedure(s) (LRB): CATARACT EXTRACTION PHACO AND INTRAOCULAR LENS PLACEMENT (IOC) (Right)  Patient Location:  Short Stay  Anesthesia Type: MAC  Level of Consciousness: awake  Airway and Oxygen Therapy: Patient Spontanous Breathing  Post-op Pain: none  Post-op Assessment: Post-op Vital signs reviewed, Patient's Cardiovascular Status Stable, Respiratory Function Stable, Patent Airway, No signs of Nausea or vomiting and Pain level controlled  Post-op Vital Signs: Reviewed and stable  Complications: No apparent anesthesia complications

## 2012-04-28 NOTE — Anesthesia Preprocedure Evaluation (Signed)
Anesthesia Evaluation  Patient identified by MRN, date of birth, ID band Patient awake    Reviewed: Allergy & Precautions, H&P , NPO status , Patient's Chart, lab work & pertinent test results  Airway Mallampati: III TM Distance: >3 FB     Dental  (+) Teeth Intact   Pulmonary sleep apnea and Continuous Positive Airway Pressure Ventilation ,    Pulmonary exam normal       Cardiovascular hypertension, Pt. on medications - angina+ CAD, + Past MI and + Cardiac Stents Rhythm:Regular Rate:Normal     Neuro/Psych CVA, Residual Symptoms    GI/Hepatic   Endo/Other  diabetes, Well Controlled, Type 2, Oral Hypoglycemic AgentsHypothyroidism   Renal/GU      Musculoskeletal   Abdominal   Peds  Hematology   Anesthesia Other Findings   Reproductive/Obstetrics                           Anesthesia Physical Anesthesia Plan  ASA: III  Anesthesia Plan: MAC   Post-op Pain Management:    Induction: Intravenous  Airway Management Planned: Nasal Cannula  Additional Equipment:   Intra-op Plan:   Post-operative Plan:   Informed Consent: I have reviewed the patients History and Physical, chart, labs and discussed the procedure including the risks, benefits and alternatives for the proposed anesthesia with the patient or authorized representative who has indicated his/her understanding and acceptance.     Plan Discussed with:   Anesthesia Plan Comments:         Anesthesia Quick Evaluation

## 2012-04-29 NOTE — Op Note (Signed)
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  POSTOPERATIVE DIAGNOSIS:  Cortical cataract, right eye, diagnosis code 366.15.  OPERATION PERFORMED:  Phacoemulsification with posterior chamber intraocular lens implantation, right eye.  SURGEON:  Bonne Dolores. Felipa Laroche, MD  ANESTHESIA:  Topical with IV sedation.  OPERATIVE SUMMARY:  In the preoperative area, dilating drops were placed into the right eye.  The patient was then brought into the operating room where he was placed under general anesthesia.  The eye was then prepped and draped.  Beginning with a 75 blade, a paracentesis port was made at the surgeon's 2 o'clock position.  The anterior chamber was then filled with a 1% nonpreserved lidocaine solution with epinephrine.  This was followed by Viscoat to deepen the chamber.  A small fornix-based peritomy was performed superiorly.  Next, a single iris hook was placed through the limbus superiorly.  A 2.4-mm keratome blade was then used to make a clear corneal incision over the iris hook.  A bent cystotome needle and Utrata forceps were used to create a continuous tear capsulotomy.  Hydrodissection was performed using balanced salt solution on a fine cannula.  The lens nucleus was then removed using phacoemulsification in a quadrant cracking technique.  The cortical material was then removed with irrigation and aspiration.  The capsular bag and anterior chamber were refilled with Provisc.  The wound was widened to approximately 3 mm and a posterior chamber intraocular lens was placed into the capsular bag without difficulty using an  [employer] lens injecting system.  A single 10-0 nylon suture was then used to close the incision as well as stromal hydration.  The Provisc was removed from the anterior chamber and capsular bag with irrigation and aspiration.  At this point, the wounds were tested for leak, which were negative.  The anterior chamber remained deep and stable.  The patient tolerated the procedure well.  There were no operative complications, and he awoke from general anesthesia without problem.  No surgical specimens.  Prosthetic device used is a Lenstec posterior chamber lens, model Softec HD, power of 20.25, serial number is [id number].          ______________________________ Susanne Greenhouse, MD     KEH/MEDQ  D:  04/28/2012  T:  04/29/2012  Job:  [id number]

## 2012-04-30 ENCOUNTER — Encounter (HOSPITAL_COMMUNITY): Payer: Self-pay | Admitting: Ophthalmology

## 2012-05-30 ENCOUNTER — Emergency Department (HOSPITAL_COMMUNITY)
Admission: EM | Admit: 2012-05-30 | Discharge: 2012-05-30 | Disposition: A | Discharge status: Home / Self Care | Payer: Medicare Other | Attending: Emergency Medicine | Admitting: Emergency Medicine

## 2012-05-30 ENCOUNTER — Encounter (HOSPITAL_COMMUNITY): Payer: Self-pay | Admitting: *Deleted

## 2012-05-30 DIAGNOSIS — E7800 Pure hypercholesterolemia, unspecified: Secondary | ICD-10-CM | POA: Insufficient documentation

## 2012-05-30 DIAGNOSIS — I2510 Atherosclerotic heart disease of native coronary artery without angina pectoris: Secondary | ICD-10-CM | POA: Insufficient documentation

## 2012-05-30 DIAGNOSIS — Z87891 Personal history of nicotine dependence: Secondary | ICD-10-CM | POA: Insufficient documentation

## 2012-05-30 DIAGNOSIS — Z794 Long term (current) use of insulin: Secondary | ICD-10-CM | POA: Insufficient documentation

## 2012-05-30 DIAGNOSIS — E119 Type 2 diabetes mellitus without complications: Secondary | ICD-10-CM | POA: Insufficient documentation

## 2012-05-30 DIAGNOSIS — M25579 Pain in unspecified ankle and joints of unspecified foot: Secondary | ICD-10-CM | POA: Insufficient documentation

## 2012-05-30 DIAGNOSIS — Z79899 Other long term (current) drug therapy: Secondary | ICD-10-CM | POA: Insufficient documentation

## 2012-05-30 DIAGNOSIS — Z8673 Personal history of transient ischemic attack (TIA), and cerebral infarction without residual deficits: Secondary | ICD-10-CM | POA: Insufficient documentation

## 2012-05-30 DIAGNOSIS — I10 Essential (primary) hypertension: Secondary | ICD-10-CM | POA: Insufficient documentation

## 2012-05-30 NOTE — ED Notes (Signed)
Pain rt foot , no injury, onset 3 days ago Pain radiates up his leg

## 2012-05-30 NOTE — ED Notes (Signed)
Patient left ambulatory, states that he is leaving.

## 2012-05-31 ENCOUNTER — Emergency Department (HOSPITAL_COMMUNITY): Payer: Medicare Other

## 2012-05-31 ENCOUNTER — Encounter (HOSPITAL_COMMUNITY): Payer: Self-pay | Admitting: Emergency Medicine

## 2012-05-31 ENCOUNTER — Emergency Department (HOSPITAL_COMMUNITY)
Admission: EM | Admit: 2012-05-31 | Discharge: 2012-05-31 | Disposition: A | Discharge status: Home / Self Care | Payer: Medicare Other | Attending: Emergency Medicine | Admitting: Emergency Medicine

## 2012-05-31 DIAGNOSIS — Z87891 Personal history of nicotine dependence: Secondary | ICD-10-CM | POA: Insufficient documentation

## 2012-05-31 DIAGNOSIS — I2510 Atherosclerotic heart disease of native coronary artery without angina pectoris: Secondary | ICD-10-CM | POA: Insufficient documentation

## 2012-05-31 DIAGNOSIS — M25579 Pain in unspecified ankle and joints of unspecified foot: Secondary | ICD-10-CM | POA: Insufficient documentation

## 2012-05-31 DIAGNOSIS — I10 Essential (primary) hypertension: Secondary | ICD-10-CM | POA: Insufficient documentation

## 2012-05-31 DIAGNOSIS — Z8739 Personal history of other diseases of the musculoskeletal system and connective tissue: Secondary | ICD-10-CM | POA: Insufficient documentation

## 2012-05-31 DIAGNOSIS — E7800 Pure hypercholesterolemia, unspecified: Secondary | ICD-10-CM | POA: Insufficient documentation

## 2012-05-31 DIAGNOSIS — Z8673 Personal history of transient ischemic attack (TIA), and cerebral infarction without residual deficits: Secondary | ICD-10-CM | POA: Insufficient documentation

## 2012-05-31 DIAGNOSIS — Z8719 Personal history of other diseases of the digestive system: Secondary | ICD-10-CM | POA: Insufficient documentation

## 2012-05-31 DIAGNOSIS — IMO0001 Reserved for inherently not codable concepts without codable children: Secondary | ICD-10-CM | POA: Insufficient documentation

## 2012-05-31 DIAGNOSIS — Z79899 Other long term (current) drug therapy: Secondary | ICD-10-CM | POA: Insufficient documentation

## 2012-05-31 DIAGNOSIS — I252 Old myocardial infarction: Secondary | ICD-10-CM | POA: Insufficient documentation

## 2012-05-31 DIAGNOSIS — E119 Type 2 diabetes mellitus without complications: Secondary | ICD-10-CM | POA: Insufficient documentation

## 2012-05-31 DIAGNOSIS — E079 Disorder of thyroid, unspecified: Secondary | ICD-10-CM | POA: Insufficient documentation

## 2012-05-31 DIAGNOSIS — Z8679 Personal history of other diseases of the circulatory system: Secondary | ICD-10-CM | POA: Insufficient documentation

## 2012-05-31 DIAGNOSIS — Z9889 Other specified postprocedural states: Secondary | ICD-10-CM | POA: Insufficient documentation

## 2012-05-31 DIAGNOSIS — M79671 Pain in right foot: Secondary | ICD-10-CM

## 2012-05-31 MED ORDER — KETOROLAC TROMETHAMINE 60 MG/2ML IM SOLN
60.0000 mg | Freq: Once | INTRAMUSCULAR | Status: AC
  Administered 2012-05-31: 60 mg via INTRAMUSCULAR
  Filled 2012-05-31: qty 2

## 2012-05-31 MED ORDER — NAPROXEN 500 MG PO TABS: 500.0000 mg | ORAL_TABLET | Freq: Two times a day (BID) | ORAL | Status: DC

## 2012-05-31 NOTE — ED Provider Notes (Signed)
History     CSN: [id number]  Arrival date & time 05/31/12  7829   First MD Initiated Contact with Patient 05/31/12 0801      Chief Complaint  Patient presents with  . Foot Pain    (Consider location/radiation/quality/duration/timing/severity/associated sxs/prior treatment) HPI Comments: Patient c/o pain to the plantar surface of the right foot that began several days ago.  He noticed a "knot" to the insole of his foot that resolved after 1-2 days.  States the pain to the same area has continued.  Pain is worse with weight bearing and palpation, improves with rest.  He denies recent injury, redness, numbness or open wounds to his foot.   Patient is a 61 y.o. male presenting with lower extremity pain. The history is provided by the patient.  Foot Pain This is a new problem. The current episode started in the past 7 days. The problem occurs constantly. The problem has been unchanged. Associated symptoms include arthralgias. Pertinent negatives include no chest pain, chills, diaphoresis, fever, joint swelling, neck pain, numbness, rash, swollen glands, vomiting or weakness. The symptoms are aggravated by standing and walking (palpation). He has tried oral narcotics (warm water soaks) for the symptoms. The treatment provided no relief.    Past Medical History  Diagnosis Date  . Diabetes mellitus   . Hypertension   . Thyroid disease   . Back pain   . Arthritis   . Glaucoma   . Heart attack   . Heart disease   . High cholesterol   . Stroke late 1990  . CAD (coronary artery disease)   . Adenomatous polyp of colon     pt thinks 2004? Dr [name]  . Tubular adenoma 2013    Dr. Jena Gauss  . Diverticulosis     Past Surgical History  Procedure Date  . Carpal tunnel release     twice  . Eyes   . Cardiac catheterization   . Colonoscopy 03/30/2004    [name]- 2 hyperplastic polyps removed  . Circumcision   . Colonoscopy 04/16/2012    Dr. Jena Gauss- diverticulosis, tubular adenoma  . Colonoscopy  04/16/2012    Procedure: COLONOSCOPY;  Surgeon: Corbin Ade, MD;  Location: [facility];  Service: Endoscopy;  Laterality: N/A;  7:30am  . Cataract extraction w/phaco 04/28/2012    Procedure: CATARACT EXTRACTION PHACO AND INTRAOCULAR LENS PLACEMENT (IOC);  Surgeon: Gemma Payor, MD;  Location: [facility];  Service: Ophthalmology;  Laterality: Right;  CDE=17.22    Family History  Problem Relation Age of Onset  . Diabetes Mother   . Hypertension Mother   . Stroke Brother   . Coronary artery disease Father     History  Substance Use Topics  . Smoking status: Former Smoker -- 1.0 packs/day for 20 years    Types: Cigarettes    Quit date: 03/19/1997  . Smokeless tobacco: Current User    Types: Snuff, Chew  . Alcohol Use: No      Review of Systems  Constitutional: Negative for fever, chills and diaphoresis.  HENT: Negative for neck pain.   Cardiovascular: Negative for chest pain.  Gastrointestinal: Negative for vomiting.  Genitourinary: Negative for dysuria and difficulty urinating.  Musculoskeletal: Positive for arthralgias. Negative for joint swelling.  Skin: Negative for color change, rash and wound.  Neurological: Negative for weakness and numbness.  All other systems reviewed and are negative.    Allergies  Review of patient's allergies indicates no known allergies.  Home Medications   Current Outpatient Rx  Name  Route  Sig  Dispense  Refill  . CLONAZEPAM 1 MG PO TABS   Oral   Take 1 mg by mouth 2 (two) times daily as needed. For anxiety         . CLOPIDOGREL BISULFATE 75 MG PO TABS   Oral   Take 75 mg by mouth daily.         [name] ESCITALOPRAM OXALATE 20 MG PO TABS   Oral   Take 20 mg by mouth daily.         [name] GLIPIZIDE 10 MG PO TABS   Oral   Take 10 mg by mouth 2 (two) times daily before a meal.         . HYDROCODONE-ACETAMINOPHEN 10-325 MG PO TABS   Oral   Take 1 tablet by mouth every 6 (six) hours as needed. Pain         . INSULIN GLARGINE 100  UNIT/ML [place] SOLN   Subcutaneous   Inject 50 Units into the skin at bedtime.         [name] METFORMIN HCL 1000 MG PO TABS   Oral   Take 1,000 mg by mouth daily with breakfast.          . SIMVASTATIN 40 MG PO TABS   Oral   Take 40 mg by mouth every evening.         [name] VALSARTAN-HYDROCHLOROTHIAZIDE 160-12.5 MG PO TABS   Oral   Take 1 tablet by mouth daily.           BP 172/100  Pulse 75  Temp 98.4 F (36.9 C) (Oral)  Resp 18  Ht 5\' 8"  (1.727 m)  Wt 240 lb (108.863 kg)  BMI 36.49 kg/m2  SpO2 99%  Physical Exam  Nursing note and vitals reviewed. Constitutional: He is oriented to person, place, and time. He appears well-developed and well-nourished. No distress.  HENT:  Head: Normocephalic and atraumatic.  Cardiovascular: Normal rate, regular rhythm, normal heart sounds and intact distal pulses.   Pulmonary/Chest: Effort normal and breath sounds normal.  Musculoskeletal: He exhibits tenderness.       Right foot: He exhibits tenderness. He exhibits normal range of motion, no bony tenderness, no swelling, normal capillary refill, no crepitus, no deformity and no laceration.       Feet:       Localized ttp of the plantar surface of the right foot, along the instep and lateral foot.  ROM is preserved.  DP pulse is brisk, distal sensation intact.  No erythema, abrasion, bruising or deformity.  No proximal tenderness  Neurological: He is alert and oriented to person, place, and time. He exhibits normal muscle tone. Coordination normal.  Skin: Skin is warm and dry.    ED Course  Procedures (including critical care time)  Labs Reviewed - No data to display No results found.   Dg Foot Complete Right  05/31/2012  *RADIOLOGY REPORT*  Clinical Data: Painful knot on the bottom of the foot.  RIGHT FOOT COMPLETE - 3+ VIEW  Comparison: Right ankle 07/31/2010  Findings: Three views of the right foot were obtained.  There is extensive ossification along the plantar aspect of the foot  that correlates with the plantar fascia.  This finding was present on the previous examination.  However, there is a new 1 cm nodular calcification in the soft tissues just superficial to the plantar fascia.  There is no evidence for a fracture or dislocation.  Mild spurring involving the calcaneus.  IMPRESSION: No acute bony abnormality in the right foot.  Again noted is extensive ossification or calcification of the plantar fascia.  There is a new nodular soft tissue calcification that could be the area of concern.   Original Report Authenticated By: Richarda Overlie, M.D.      MDM     Pt received IM Toradol for pain, x-ray reviewed and discussed findings with the patient.    I have advised him to try OTC orthotic, foot exercises, elevate and ice.  Will give referral for podiatry  Prescribed: naprosyn   Leshawn Straka L. [place], [place] 05/31/12 [phone]

## 2012-05-31 NOTE — ED Notes (Signed)
Pt c/o rt foot pain x 3 days. Pt denies any injury.

## 2012-05-31 NOTE — ED Notes (Signed)
Pt c/o right foot pain located on bottom of foot near arch area, pain started 3-4 days ago, denies any injury, states that the area feels "like a ball" in under his foot, pain is worse with walking.

## 2012-06-02 NOTE — ED Provider Notes (Signed)
Medical screening examination/treatment/procedure(s) were performed by non-physician practitioner and as supervising physician I was immediately available for consultation/collaboration.   Shelda Jakes, MD 06/02/12 [phone]

## 2012-07-29 ENCOUNTER — Ambulatory Visit (INDEPENDENT_AMBULATORY_CARE_PROVIDER_SITE_OTHER): Payer: Medicare Other | Admitting: Internal Medicine

## 2012-07-29 ENCOUNTER — Encounter: Payer: Self-pay | Admitting: Internal Medicine

## 2012-07-29 VITALS — BP 159/96 | HR 75 | Temp 97.5°F | Ht 68.0 in | Wt 255.0 lb

## 2012-07-29 DIAGNOSIS — Z8371 Family history of colonic polyps: Secondary | ICD-10-CM

## 2012-07-29 MED ORDER — PEG 3350-KCL-NA BICARB-NACL 420 G PO SOLR: 6000.0000 mL | ORAL | Status: DC

## 2012-07-29 NOTE — Patient Instructions (Addendum)
Need repeat colonoscopy now with a good preparation to follow-up on polyps  Decrease Glucotrol to 5 mg twice daily the 2 days prior to the procedure. The decrease metformin to one tablet twice daily 2 days prior to the procedure  Decrease Lantus insulin to 30 units at bedtime for 2 nights before the colonoscopy

## 2012-07-29 NOTE — Progress Notes (Signed)
Primary Care Physician:  Avon Gully, MD Primary Gastroenterologist:  Dr. Jena Gauss  Pre-Procedure History & Physical: HPI:  Derrick [name] is a 62 y.o. male here for for followup of multiple colonic adenomas removed from his colon 3 months ago. He had some. Difficult polyps and a sitting segment the setting of a poor prep. Multiple piecemeal polypectomies performed. Pathology came back adenomatous. Prep was poor. He needs to have a followup colonoscopy now, hopefully, the setting of a good preparation. He previously was on Plavix but came off of this medication on his own. History of CVA with a history of coronary stents, etc.  Past Medical History  Diagnosis Date  . Diabetes mellitus   . Hypertension   . Thyroid disease   . Back pain   . Arthritis   . Glaucoma   . Heart attack   . Heart disease   . High cholesterol   . Stroke late 1990  . CAD (coronary artery disease)   . Adenomatous polyp of colon     pt thinks 2004? Dr [name]  . Tubular adenoma 04/16/2012    Dr. Jena Gauss  . Diverticulosis     Past Surgical History  Procedure Date  . Carpal tunnel release     twice  . Eyes   . Cardiac catheterization   . Colonoscopy 03/30/2004    [name]- 2 hyperplastic polyps removed  . Circumcision   . Colonoscopy 04/16/2012    Dr. Jena Gauss- diverticulosis, tubular adenoma  . Cataract extraction w/phaco 04/28/2012    Procedure: CATARACT EXTRACTION PHACO AND INTRAOCULAR LENS PLACEMENT (IOC);  Surgeon: Gemma Payor, MD;  Location: [facility];  Service: Ophthalmology;  Laterality: Right;  CDE=17.22    Prior to Admission medications   Medication Sig Start Date End Date Taking? Authorizing Provider  clonazePAM (KLONOPIN) 1 MG tablet Take 1 mg by mouth 2 (two) times daily as needed. For anxiety   Yes Historical Provider, MD  clopidogrel (PLAVIX) 75 MG tablet Take 75 mg by mouth daily.   Yes Historical Provider, MD  escitalopram (LEXAPRO) 20 MG tablet Take 20 mg by mouth daily.   Yes Historical Provider, MD    glipiZIDE (GLUCOTROL) 10 MG tablet Take 10 mg by mouth 2 (two) times daily before a meal.   Yes Historical Provider, MD  HYDROcodone-acetaminophen (NORCO) 10-325 MG per tablet Take 1 tablet by mouth every 6 (six) hours as needed. Pain   Yes Historical Provider, MD  insulin glargine (LANTUS) 100 UNIT/ML injection Inject 50 Units into the skin at bedtime.   Yes Historical Provider, MD  metFORMIN (GLUCOPHAGE) 1000 MG tablet Take 1,000 mg by mouth 2 (two) times daily.    Yes Historical Provider, MD  naproxen (NAPROSYN) 500 MG tablet Take 1 tablet (500 mg total) by mouth 2 (two) times daily with a meal. 05/31/12  Yes Tammy L. Triplett, PA  simvastatin (ZOCOR) 40 MG tablet Take 40 mg by mouth every evening.   Yes Historical Provider, MD  valsartan-hydrochlorothiazide (DIOVAN-HCT) 160-12.5 MG per tablet Take 1 tablet by mouth daily.   Yes Historical Provider, MD    Allergies as of 07/29/2012  . (No Known Allergies)    Family History  Problem Relation Age of Onset  . Diabetes Mother   . Hypertension Mother   . Stroke Brother   . Coronary artery disease Father     History   Social History  . Marital Status: Legally Separated    Spouse Name: N/A    Number of Children: 12  .  Years of Education: N/A   Occupational History  . disabled    Social History Main Topics  . Smoking status: Former Smoker -- 1.0 packs/day for 20 years    Types: Cigarettes    Quit date: 03/19/1997  . Smokeless tobacco: Current User    Types: Snuff, Chew  . Alcohol Use: No  . Drug Use: No  . Sexually Active: Not on file   Other Topics Concern  . Not on file   Social History Narrative   Lives alone    Review of Systems: See HPI, otherwise negative ROS  Physical Exam: BP 159/96  Pulse 75  Temp 97.5 F (36.4 C) (Oral)  Ht 5\' 8"  (1.727 m)  Wt 255 lb (115.667 kg)  BMI 38.77 kg/m2 General:   Disheveled alert conversant individual  pleasant and cooperative in NAD Skin:  Intact without significant  lesions or rashes. Eyes:  Sclera clear, no icterus.   Conjunctiva pink. Ears:  Normal auditory acuity. Nose:  No deformity, discharge,  or lesions. Mouth:  No deformity or lesions. Neck:  Supple; no masses or thyromegaly. No significant cervical adenopathy. Lungs:  Clear throughout to auscultation.   No wheezes, crackles, or rhonchi. No acute distress. Heart:  Regular rate and rhythm; no murmurs, clicks, rubs,  or gallops. Abdomen: Non-distended, normal bowel sounds.  Soft and nontender without appreciable mass or hepatosplenomegaly.  Pulses:  Normal pulses noted. Extremities:  Without clubbing or edema.  Impression/Plan:  Multiple colonic adenomas removed, some piecemeal, 3 months ago in the setting of a relatively poor preparation. The patient needs a followup colonoscopy now. I discussed the importance of of this in some detail with the patient and family in the office today.The risks, benefits, limitations, alternatives and imponderables have been reviewed with the patient. Questions have been answered. All parties are agreeable.   Patient will need clear liquids for full 2 days prior to the procedure. Plan given 4 L of GoLYTELY the day before  And  2 L of GoLYTELY the morning of and we'll plan to perform colonoscopy midday.

## 2012-07-30 ENCOUNTER — Encounter (HOSPITAL_COMMUNITY): Payer: Self-pay | Admitting: Pharmacy Technician

## 2012-08-07 ENCOUNTER — Ambulatory Visit (HOSPITAL_COMMUNITY)
Admission: RE | Admit: 2012-08-07 | Discharge: 2012-08-07 | Disposition: A | Discharge status: Home / Self Care | Payer: Medicare Other | Source: Ambulatory Visit | Attending: Internal Medicine | Admitting: Internal Medicine

## 2012-08-07 ENCOUNTER — Encounter (HOSPITAL_COMMUNITY)
Admission: RE | Disposition: A | Discharge status: Home / Self Care | Payer: Self-pay | Source: Ambulatory Visit | Attending: Internal Medicine

## 2012-08-07 SURGERY — COLONOSCOPY
Anesthesia: Moderate Sedation

## 2012-08-07 MED ORDER — ONDANSETRON HCL 4 MG/2ML IJ SOLN
INTRAMUSCULAR | Status: AC
  Filled 2012-08-07: qty 2

## 2012-08-07 MED ORDER — MIDAZOLAM HCL 5 MG/5ML IJ SOLN
INTRAMUSCULAR | Status: AC
  Filled 2012-08-07: qty 10

## 2012-08-07 MED ORDER — MEPERIDINE HCL 100 MG/ML IJ SOLN
INTRAMUSCULAR | Status: AC
  Filled 2012-08-07: qty 2

## 2012-08-07 NOTE — OR Nursing (Signed)
Pt. Concerned that the prep didn't clean him out as much as it should . Stated he still had dark liquids with some stool . Dr. Jena Gauss was informed, Dr. Jena Gauss said to see if he would like to have more prep here at [facility] and we would do him at the end of the schedule. Pt. states he would prefer to reschedule. He is nauseated and really don't think that he could drink it at this time. He said he had this procedure appr. 4 years ago from a Dr. In [place] and the prep he took then worked  better. He said that was Dr. Felizardo Hoffmann , not sure what kind of prep that was. Office called and informed about issue and cancellation. The office will reschedule pt.at another time.Our scheduler Florian Buff notified of cancellation.

## 2012-08-11 ENCOUNTER — Other Ambulatory Visit: Payer: Self-pay | Admitting: Internal Medicine

## 2012-08-11 DIAGNOSIS — Z86010 Personal history of colonic polyps: Secondary | ICD-10-CM

## 2012-08-11 MED ORDER — PEG 3350-KCL-NA BICARB-NACL 420 G PO SOLR: 6000.0000 mL | ORAL | Status: DC

## 2012-08-12 ENCOUNTER — Encounter (HOSPITAL_COMMUNITY): Payer: Self-pay | Admitting: Pharmacy Technician

## 2012-08-27 ENCOUNTER — Telehealth: Payer: Self-pay | Admitting: Internal Medicine

## 2012-08-27 ENCOUNTER — Ambulatory Visit (HOSPITAL_COMMUNITY): Admission: RE | Admit: 2012-08-27 | Payer: Medicare Other | Source: Ambulatory Visit | Admitting: Internal Medicine

## 2012-08-27 SURGERY — COLONOSCOPY
Anesthesia: Moderate Sedation

## 2012-08-27 NOTE — Telephone Encounter (Signed)
Patients daughter called Endo and stated that Derrick [name] would not be coming for his Procedure and they didn't know when they could call back to R/S

## 2014-04-20 ENCOUNTER — Encounter (HOSPITAL_COMMUNITY): Payer: Self-pay | Admitting: Psychiatry

## 2014-04-20 ENCOUNTER — Ambulatory Visit (INDEPENDENT_AMBULATORY_CARE_PROVIDER_SITE_OTHER): Payer: Medicare Other | Admitting: Psychiatry

## 2014-04-20 VITALS — BP 174/108 | HR 68 | Ht 68.0 in | Wt 261.4 lb

## 2014-04-20 DIAGNOSIS — F332 Major depressive disorder, recurrent severe without psychotic features: Secondary | ICD-10-CM

## 2014-04-20 DIAGNOSIS — F329 Major depressive disorder, single episode, unspecified: Secondary | ICD-10-CM | POA: Insufficient documentation

## 2014-04-20 MED ORDER — DULOXETINE HCL 60 MG PO CPEP: 60.0000 mg | ORAL_CAPSULE | Freq: Every day | ORAL | Status: DC

## 2014-04-20 MED ORDER — MIRTAZAPINE 15 MG PO TABS: 15.0000 mg | ORAL_TABLET | Freq: Every day | ORAL | Status: DC
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
## 2014-05-13 ENCOUNTER — Other Ambulatory Visit (HOSPITAL_COMMUNITY): Payer: Self-pay | Admitting: Psychiatry

## 2014-05-13 ENCOUNTER — Telehealth (HOSPITAL_COMMUNITY): Payer: Self-pay | Admitting: *Deleted

## 2014-05-13 MED ORDER — ESCITALOPRAM OXALATE 10 MG PO TABS: 10.0000 mg | ORAL_TABLET | Freq: Every day | ORAL | Status: DC

## 2014-05-13 NOTE — Telephone Encounter (Signed)
Pt calling stating he thinks the two new medications that Dr. Harrington Challenger put him on is causing him to have difficulties in urinating and causing him to feel dry. Asked pt if he is drinking a lot of fluids. Per pt he is drinking a lot of fluids and he is still having difficulties urinating. Pt number is [phone]. Pt would like a call from Dr. Harrington Challenger to advise him what to do.

## 2014-05-13 NOTE — Telephone Encounter (Signed)
Pt stopped remeron and cymbalta 2 days ago and his urination has gone back to normal. We will try low dose Lexapro-10 mg daily

## 2014-05-20 ENCOUNTER — Encounter (HOSPITAL_COMMUNITY): Payer: Self-pay | Admitting: Psychiatry

## 2014-05-20 ENCOUNTER — Ambulatory Visit (INDEPENDENT_AMBULATORY_CARE_PROVIDER_SITE_OTHER): Payer: Medicare Other | Admitting: Psychiatry

## 2014-05-20 VITALS — BP 139/87 | HR 66 | Ht 68.0 in | Wt 257.4 lb

## 2014-05-20 DIAGNOSIS — F332 Major depressive disorder, recurrent severe without psychotic features: Secondary | ICD-10-CM

## 2014-05-20 MED ORDER — TEMAZEPAM 15 MG PO CAPS: 15.0000 mg | ORAL_CAPSULE | Freq: Every evening | ORAL | Status: DC | PRN
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
## 2014-06-16 ENCOUNTER — Other Ambulatory Visit (HOSPITAL_COMMUNITY): Payer: Self-pay | Admitting: Psychiatry

## 2014-06-16 ENCOUNTER — Encounter (HOSPITAL_COMMUNITY): Payer: Self-pay | Admitting: Psychiatry

## 2014-06-16 ENCOUNTER — Ambulatory Visit (INDEPENDENT_AMBULATORY_CARE_PROVIDER_SITE_OTHER): Payer: Medicare Other | Admitting: Psychiatry

## 2014-06-16 VITALS — BP 164/95 | HR 65 | Ht 68.0 in | Wt 253.6 lb

## 2014-06-16 DIAGNOSIS — F332 Major depressive disorder, recurrent severe without psychotic features: Secondary | ICD-10-CM

## 2014-06-16 MED ORDER — ESCITALOPRAM OXALATE 20 MG PO TABS: 20.0000 mg | ORAL_TABLET | Freq: Every day | ORAL | Status: DC
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
## 2014-07-17 ENCOUNTER — Telehealth: Payer: Self-pay | Admitting: Internal Medicine

## 2014-07-17 ENCOUNTER — Emergency Department (HOSPITAL_COMMUNITY)
Admission: EM | Admit: 2014-07-17 | Discharge: 2014-07-17 | Disposition: A | Discharge status: Home / Self Care | Payer: Medicare Other | Attending: Emergency Medicine | Admitting: Emergency Medicine

## 2014-07-17 ENCOUNTER — Other Ambulatory Visit: Payer: Self-pay | Admitting: Internal Medicine

## 2014-07-17 ENCOUNTER — Encounter (HOSPITAL_COMMUNITY): Payer: Self-pay

## 2014-07-17 ENCOUNTER — Emergency Department (HOSPITAL_COMMUNITY): Payer: Medicare Other

## 2014-07-17 DIAGNOSIS — I10 Essential (primary) hypertension: Secondary | ICD-10-CM | POA: Insufficient documentation

## 2014-07-17 DIAGNOSIS — R109 Unspecified abdominal pain: Secondary | ICD-10-CM | POA: Diagnosis not present

## 2014-07-17 DIAGNOSIS — E119 Type 2 diabetes mellitus without complications: Secondary | ICD-10-CM | POA: Insufficient documentation

## 2014-07-17 DIAGNOSIS — R197 Diarrhea, unspecified: Secondary | ICD-10-CM | POA: Insufficient documentation

## 2014-07-17 DIAGNOSIS — Z87891 Personal history of nicotine dependence: Secondary | ICD-10-CM | POA: Insufficient documentation

## 2014-07-17 DIAGNOSIS — Z8739 Personal history of other diseases of the musculoskeletal system and connective tissue: Secondary | ICD-10-CM | POA: Insufficient documentation

## 2014-07-17 DIAGNOSIS — Z7902 Long term (current) use of antithrombotics/antiplatelets: Secondary | ICD-10-CM | POA: Insufficient documentation

## 2014-07-17 DIAGNOSIS — I2510 Atherosclerotic heart disease of native coronary artery without angina pectoris: Secondary | ICD-10-CM | POA: Diagnosis not present

## 2014-07-17 DIAGNOSIS — Z8669 Personal history of other diseases of the nervous system and sense organs: Secondary | ICD-10-CM | POA: Insufficient documentation

## 2014-07-17 DIAGNOSIS — R52 Pain, unspecified: Secondary | ICD-10-CM

## 2014-07-17 DIAGNOSIS — R1110 Vomiting, unspecified: Secondary | ICD-10-CM | POA: Insufficient documentation

## 2014-07-17 DIAGNOSIS — E780 Pure hypercholesterolemia: Secondary | ICD-10-CM | POA: Diagnosis not present

## 2014-07-17 DIAGNOSIS — K429 Umbilical hernia without obstruction or gangrene: Secondary | ICD-10-CM | POA: Insufficient documentation

## 2014-07-17 DIAGNOSIS — Z9889 Other specified postprocedural states: Secondary | ICD-10-CM | POA: Diagnosis not present

## 2014-07-17 DIAGNOSIS — Z79899 Other long term (current) drug therapy: Secondary | ICD-10-CM | POA: Diagnosis not present

## 2014-07-17 DIAGNOSIS — Z86010 Personal history of colonic polyps: Secondary | ICD-10-CM | POA: Insufficient documentation

## 2014-07-17 DIAGNOSIS — Z8673 Personal history of transient ischemic attack (TIA), and cerebral infarction without residual deficits: Secondary | ICD-10-CM | POA: Insufficient documentation

## 2014-07-17 DIAGNOSIS — Z794 Long term (current) use of insulin: Secondary | ICD-10-CM | POA: Diagnosis not present

## 2014-07-17 LAB — CBC WITH DIFFERENTIAL/PLATELET
BASOS PCT: 0 % (ref 0–1)
Basophils Absolute: 0 10*3/uL (ref 0.0–0.1)
Eosinophils Absolute: 0.5 10*3/uL (ref 0.0–0.7)
Eosinophils Relative: 7 % — ABNORMAL HIGH (ref 0–5)
HEMATOCRIT: 34.1 % — AB (ref 39.0–52.0)
Hemoglobin: 11.6 g/dL — ABNORMAL LOW (ref 13.0–17.0)
LYMPHS PCT: 34 % (ref 12–46)
Lymphs Abs: 2.4 10*3/uL (ref 0.7–4.0)
MCH: 25.1 pg — ABNORMAL LOW (ref 26.0–34.0)
MCHC: 34 g/dL (ref 30.0–36.0)
MCV: 73.8 fL — ABNORMAL LOW (ref 78.0–100.0)
MONO ABS: 0.4 10*3/uL (ref 0.1–1.0)
Monocytes Relative: 6 % (ref 3–12)
Neutro Abs: 3.8 10*3/uL (ref 1.7–7.7)
Neutrophils Relative %: 53 % (ref 43–77)
Platelets: 331 10*3/uL (ref 150–400)
RBC: 4.62 MIL/uL (ref 4.22–5.81)
RDW: 14.6 % (ref 11.5–15.5)
WBC: 7.1 10*3/uL (ref 4.0–10.5)

## 2014-07-17 LAB — COMPREHENSIVE METABOLIC PANEL
ALT: 11 U/L (ref 0–53)
ANION GAP: 7 (ref 5–15)
AST: 10 U/L (ref 0–37)
Albumin: 3.6 g/dL (ref 3.5–5.2)
Alkaline Phosphatase: 54 U/L (ref 39–117)
BILIRUBIN TOTAL: 0.3 mg/dL (ref 0.3–1.2)
BUN: 11 mg/dL (ref 6–23)
CHLORIDE: 106 meq/L (ref 96–112)
CO2: 23 mmol/L (ref 19–32)
Calcium: 8.7 mg/dL (ref 8.4–10.5)
Creatinine, Ser: 1.03 mg/dL (ref 0.50–1.35)
GFR calc Af Amer: 87 mL/min — ABNORMAL LOW (ref >90)
GFR calc non Af Amer: 75 mL/min — ABNORMAL LOW (ref >90)
Glucose, Bld: 227 mg/dL — ABNORMAL HIGH (ref 70–99)
Potassium: 3.7 mmol/L (ref 3.5–5.1)
Sodium: 136 mmol/L (ref 135–145)
Total Protein: 7.2 g/dL (ref 6.0–8.3)

## 2014-07-17 LAB — LIPASE, BLOOD: LIPASE: 24 U/L (ref 11–59)

## 2014-07-17 MED ORDER — SODIUM CHLORIDE 0.9 % IJ SOLN
INTRAMUSCULAR | Status: AC
  Filled 2014-07-17: qty 60

## 2014-07-17 MED ORDER — SODIUM CHLORIDE 0.9 % IJ SOLN
INTRAMUSCULAR | Status: AC
  Filled 2014-07-17: qty 1000

## 2014-07-17 MED ORDER — IOHEXOL 300 MG/ML  SOLN
50.0000 mL | Freq: Once | INTRAMUSCULAR | Status: AC | PRN
  Administered 2014-07-17: 50 mL via ORAL

## 2014-07-17 MED ORDER — IOHEXOL 300 MG/ML  SOLN
100.0000 mL | Freq: Once | INTRAMUSCULAR | Status: AC | PRN
  Administered 2014-07-17: 100 mL via INTRAVENOUS

## 2014-07-17 NOTE — ED Provider Notes (Signed)
CSN: [id number]     Arrival date & time 07/17/14  1050 History  This chart was scribed for Orlie Dakin, MD by Stephania Fragmin, ED Scribe. This patient was seen in room APA01/APA01 and the patient's care was started at 11:07 AM.    Chief Complaint  Patient presents with  . Abdominal Pain   The history is provided by the patient, a relative and medical records. No language interpreter was used.     HPI Comments: Derrick [name] is a 64 y.o. male who presents to the Emergency Department complaining of sharp abdominal pains that began 1 month ago and worsened last night. He also complains of one episode of vomiting yesterday, and one episode of diarrhea after taking Dulcolax. There are no other associated symptoms. He is presently not nauseated and hungry pain is at the umbilicus at site of "a knot" Per daughter, he ate chicken and meatballs yesterday. Patient presently feels mild abdominal pain and denies nausea. Laying on his right side alleviates his symptoms. There are no other modifying factors. His daughter states that patient was treated for the same problem recently and treated with pantoprazole. Patient had a colonoscopy done by Dr. Sydell Axon last year. Patient has a history of hypertension and DM. He also has a surgical history of colon polyp removal. He denies smoking (former P[employer] smoker for 20 years, quit in 1998, per medical records), EtOH consumption, and illicit drug use. He also denies urinary symptoms. Patient denies needing pain medication at this time.  Past Medical History  Diagnosis Date  . Diabetes mellitus   . Hypertension   . Thyroid disease   . Back pain   . Arthritis   . Glaucoma   . Heart attack   . Heart disease   . High cholesterol   . Stroke late 1990  . CAD (coronary artery disease)   . Adenomatous polyp of colon     pt thinks 2004? Dr Lajoyce Corners  . Tubular adenoma 04/16/2012    [name]  . Diverticulosis    Past Surgical History  Procedure Laterality Date  . Carpal  tunnel release      twice  . Eyes    . Cardiac catheterization    . Colonoscopy  03/30/2004    Lajoyce Corners- 2 hyperplastic polyps removed  . Circumcision    . Colonoscopy  04/16/2012    [name]- diverticulosis, tubular adenoma  . Cataract extraction w/phaco  04/28/2012    Procedure: CATARACT EXTRACTION PHACO AND INTRAOCULAR LENS PLACEMENT (IOC);  Surgeon: [name], MD;  Location: [facility];  Service: Ophthalmology;  Laterality: Right;  CDE=17.22   Family History  Problem Relation Age of Onset  . Diabetes Mother   . Hypertension Mother   . Stroke Brother   . Coronary artery disease Father   . ADD / ADHD Daughter    History  Substance Use Topics  . Smoking status: Former Smoker -- 1.00 packs/day for 20 years    Types: Cigarettes    Quit date: 03/19/1997  . Smokeless tobacco: Current User    Types: Snuff, Chew  . Alcohol Use: No    Review of Systems  Constitutional: Negative.   HENT: Negative.   Respiratory: Negative.   Cardiovascular: Negative.   Gastrointestinal: Positive for vomiting, abdominal pain and diarrhea.  Musculoskeletal: Negative.   Skin: Negative.   Neurological: Negative.   Psychiatric/Behavioral: Negative.   All other systems reviewed and are negative.     Allergies  Review of patient's allergies indicates  no known allergies.  Home Medications   Prior to Admission medications   Medication Sig Start Date End Date Taking? Authorizing Provider  clopidogrel (PLAVIX) 75 MG tablet Take 75 mg by mouth daily.    Historical Provider, MD  escitalopram (LEXAPRO) 20 MG tablet Take 1 tablet (20 mg total) by mouth daily. 06/16/14 06/16/15  Levonne Spiller, MD  glipiZIDE (GLUCOTROL) 10 MG tablet Take 10 mg by mouth 2 (two) times daily before a meal.    Historical Provider, MD  insulin glargine (LANTUS) 100 UNIT/ML injection Inject 65 Units into the skin at bedtime.     Historical Provider, MD  metFORMIN (GLUCOPHAGE) 1000 MG tablet Take 1,000 mg by mouth 2 (two) times daily.      Historical Provider, MD  oxyCODONE (ROXICODONE) 15 MG immediate release tablet Take 15 mg by mouth 3 (three) times daily as needed.  04/04/14   Historical Provider, MD  simvastatin (ZOCOR) 40 MG tablet Take 40 mg by mouth every evening.    Historical Provider, MD  temazepam (RESTORIL) 15 MG capsule Take 1 capsule (15 mg total) by mouth at bedtime as needed for sleep. 05/20/14   Levonne Spiller, MD  TRADJENTA 5 MG TABS tablet  03/16/14   Historical Provider, MD  valsartan-hydrochlorothiazide (DIOVAN-HCT) 160-12.5 MG per tablet Take 1 tablet by mouth daily.    Historical Provider, MD   There were no vitals taken for this visit. Physical Exam  Constitutional: He appears well-developed and well-nourished.  HENT:  Head: Normocephalic and atraumatic.  Eyes: Conjunctivae are normal. Pupils are equal, round, and reactive to light.  Neck: Neck supple. No tracheal deviation present. No thyromegaly present.  Cardiovascular: Normal rate and regular rhythm.   No murmur heard. Pulmonary/Chest: Effort normal and breath sounds normal.  Abdominal: Soft. Bowel sounds are normal. He exhibits no distension. There is tenderness.  Obese Umbilical hernia which is tender, not red or warm, easily reducible. No other tenderness  Genitourinary: Penis normal.  Normal male genitalia, circumcised  Musculoskeletal: Normal range of motion. He exhibits no edema or tenderness.  Neurological: He is alert. Coordination normal.  Skin: Skin is warm and dry. No rash noted.  Psychiatric: He has a normal mood and affect.  Nursing note and vitals reviewed.   ED Course  Procedures (including critical care time)  DIAGNOSTIC STUDIES: Oxygen Saturation is 100% on room air, normal by my interpretation.    COORDINATION OF CARE: 11:16 AM - Discussed treatment plan with pt at bedside.   Labs Review Labs Reviewed - No data to display  Imaging Review No results found.   EKG Interpretation None     3:40 PM pain remains  controlled without pain medicine administered. Patient declines pain medicine Results for orders placed or performed during the hospital encounter of 07/17/14  CBC with Differential  Result Value Ref Range   WBC 7.1 4.0 - 10.5 K/uL   RBC 4.62 4.22 - 5.81 MIL/uL   Hemoglobin 11.6 (L) 13.0 - 17.0 g/dL   HCT 34.1 (L) 39.0 - 52.0 %   MCV 73.8 (L) 78.0 - 100.0 fL   MCH 25.1 (L) 26.0 - 34.0 pg   MCHC 34.0 30.0 - 36.0 g/dL   RDW 14.6 11.5 - 15.5 %   Platelets 331 150 - 400 K/uL   Neutrophils Relative % 53 43 - 77 %   Lymphocytes Relative 34 12 - 46 %   Monocytes Relative 6 3 - 12 %   Eosinophils Relative 7 (H) 0 - 5 %  Basophils Relative 0 0 - 1 %   Neutro Abs 3.8 1.7 - 7.7 K/uL   Lymphs Abs 2.4 0.7 - 4.0 K/uL   Monocytes Absolute 0.4 0.1 - 1.0 K/uL   Eosinophils Absolute 0.5 0.0 - 0.7 K/uL   Basophils Absolute 0.0 0.0 - 0.1 K/uL   RBC Morphology TEARDROP CELLS   Comprehensive metabolic panel  Result Value Ref Range   Sodium 136 135 - 145 mmol/L   Potassium 3.7 3.5 - 5.1 mmol/L   Chloride 106 96 - 112 mEq/L   CO2 23 19 - 32 mmol/L   Glucose, Bld 227 (H) 70 - 99 mg/dL   BUN 11 6 - 23 mg/dL   Creatinine, Ser 1.03 0.50 - 1.35 mg/dL   Calcium 8.7 8.4 - 10.5 mg/dL   Total Protein 7.2 6.0 - 8.3 g/dL   Albumin 3.6 3.5 - 5.2 g/dL   AST 10 0 - 37 U/L   ALT 11 0 - 53 U/L   Alkaline Phosphatase 54 39 - 117 U/L   Total Bilirubin 0.3 0.3 - 1.2 mg/dL   GFR calc non Af Amer 75 (L) >90 mL/min   GFR calc Af Amer 87 (L) >90 mL/min   Anion gap 7 5 - 15  Lipase, blood  Result Value Ref Range   Lipase 24 11 - 59 U/L   Ct Abdomen Pelvis W Contrast  07/17/2014   CLINICAL DATA:  Abdominal pain.  Vomiting.  EXAM: CT ABDOMEN AND PELVIS WITH CONTRAST  TECHNIQUE: Multidetector CT imaging of the abdomen and pelvis was performed using the standard protocol following bolus administration of intravenous contrast.  CONTRAST:  1mL OMNIPAQUE IOHEXOL 300 MG/ML SOLN, 137mL OMNIPAQUE IOHEXOL 300 MG/ML SOLN   COMPARISON:  None.  FINDINGS: There is an "apple-core" lesion of the hepatic flexure consistent with primary carcinoma of the colon. This is immediately adjacent to the gallbladder and the second portion of the duodenum. There are 12 and 10 mm nodes in the adjacent mesentery visible on images 24 and 26 of series 7.  The liver, biliary tree, spleen, pancreas, adrenal glands, and kidneys appear normal except for calcified granulomas in the spleen.  The bladder and prostate gland appear normal. No free air free fluid in the abdomen. No osseous abnormality.  The bowel is normal except for the lesion in the hepatic flexure of the colon.  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 Slight narrowing of the lumen without obstruction. Adjacent nodes in the mesentery may represent metastasis.   Electronically Signed   By: Rozetta Nunnery M.D.   On: 07/17/2014 14:54    MDM  I discussed with patient possibility that he has cancer. I spoke with Dr.Rourk from GI who will call patient in 2 days to arrange for colonoscopy and follow-up with appropriate specialists if indicated. Blood pressure recheck one week. Patient reports that he has adequate pain medicine at home  Final diagnoses:  None   dx #1 abdominal pain #2 hyperglycemia #3 hypertension      Orlie Dakin, MD 07/17/14 1559

## 2014-07-17 NOTE — Discharge Instructions (Signed)
Abdominal Pain We are concerned from your CT scan today that you may have cancer of your intestines. Dr.Rourk has been called by [place] and he will call you in 2 days to arrange for a colonoscopy. If you don't hear from him by the afternoon of Monday, 07/19/2014, call to schedule the appointment. Tell office staff thatDr. Winfred Leeds spoke with [name] about your case. Get your blood pressure rechecked in a week. Today's was elevated at 185/107. You can take your pain medication as prescribed. Return if unable to hold down her pain medicine without vomiting or if your pain is not well controlled Many things can cause abdominal pain. Usually, abdominal pain is not caused by a disease and will improve without treatment. It can often be observed and treated at home. Your health care provider will do a physical exam and possibly order blood tests and X-rays to help determine the seriousness of your pain. However, in many cases, more time must pass before a clear cause of the pain can be found. Before that point, your health care provider may not know if you need more testing or further treatment. HOME CARE INSTRUCTIONS  Monitor your abdominal pain for any changes. The following actions may help to alleviate any discomfort you are experiencing:  Only take over-the-counter or prescription medicines as directed by your health care provider.  Do not take laxatives unless directed to do so by your health care provider.  Try a clear liquid diet (broth, tea, or water) as directed by your health care provider. Slowly move to a bland diet as tolerated. SEEK MEDICAL CARE IF:  You have unexplained abdominal pain.  You have abdominal pain associated with nausea or diarrhea.  You have pain when you urinate or have a bowel movement.  You experience abdominal pain that wakes you in the night.  You have abdominal pain that is worsened or improved by eating food.  You have abdominal pain that is worsened with eating  fatty foods.  You have a fever. SEEK IMMEDIATE MEDICAL CARE IF:   Your pain does not go away within 2 hours.  You keep throwing up (vomiting).  Your pain is felt only in portions of the abdomen, such as the right side or the left lower portion of the abdomen.  You pass bloody or black tarry stools. MAKE SURE YOU:  Understand these instructions.   Will watch your condition.   Will get help right away if you are not doing well or get worse.  Document Released: 04/11/2005 Document Revised: 07/07/2013 Document Reviewed: 03/11/2013 [facility] Patient Information 2015 [place], [place]. This information is not intended to replace advice given to you by your health care provider. Make sure you discuss any questions you have with your health care provider.

## 2014-07-17 NOTE — ED Notes (Signed)
Pt is complaining of abdominal pain just above his navel. States he vomited one time yesterday

## 2014-07-17 NOTE — ED Notes (Signed)
Patient educated on holding metformin x 48 hours due to contrast used for CT, patient verbalized understanding.

## 2014-07-17 NOTE — ED Notes (Signed)
CT notified by secretary that patient has finished contrast.

## 2014-07-17 NOTE — Telephone Encounter (Signed)
Dr. Milagros Evener called. Derrick [name] w abd pin . CT suggest hepatic flexure CRC; Derrick [name] had lesion here in 2013 tcs; needed early re-assessmant via tcs 2/14 - daughter called and cancelled procedure 2/14.. Derrick [name] need urgent contact 07/19/14 - ov for an urgent tcs- will need a better prep.

## 2014-07-17 NOTE — ED Notes (Signed)
Pt back over for CT

## 2014-07-17 NOTE — ED Notes (Signed)
CT called, machine overheated, will call back to advise.

## 2014-07-19 ENCOUNTER — Telehealth: Payer: Self-pay

## 2014-07-19 ENCOUNTER — Ambulatory Visit (INDEPENDENT_AMBULATORY_CARE_PROVIDER_SITE_OTHER): Payer: Medicare Other | Admitting: Nurse Practitioner

## 2014-07-19 ENCOUNTER — Encounter: Payer: Self-pay | Admitting: Nurse Practitioner

## 2014-07-19 ENCOUNTER — Other Ambulatory Visit: Payer: Self-pay

## 2014-07-19 VITALS — BP 155/98 | HR 77 | Temp 97.3°F | Ht 68.0 in | Wt 248.8 lb

## 2014-07-19 DIAGNOSIS — K6389 Other specified diseases of intestine: Secondary | ICD-10-CM

## 2014-07-19 DIAGNOSIS — R1084 Generalized abdominal pain: Secondary | ICD-10-CM

## 2014-07-19 DIAGNOSIS — R109 Unspecified abdominal pain: Secondary | ICD-10-CM | POA: Insufficient documentation

## 2014-07-19 MED ORDER — PEG-KCL-NACL-NASULF-NA ASC-C 100 G PO SOLR: 1.0000 | ORAL | Status: DC

## 2014-07-19 NOTE — Assessment & Plan Note (Addendum)
Presented to ER with acute abdominal pain and CT demonstrated "an 'apple-core' lesion of the hepatic flexure consistent with primary carcinoma of the colon and adjacent nodes in the mesentery may represent metastasis." Previous colonoscopy with multiple polyps. 3 month follow-up colonoscopy was missed by patient. Will refer for urgent colonoscopy to evaluate and further recommendations will be based on those results. Will need procedure in the OR with propofol due to chronic pain management medications.  Proceed with TCS with [name] in near future: the risks, benefits, and alternatives have been discussed with the patient in detail. The patient states understanding and desires to proceed.  Due to poor prep with last colonoscopy will add a second day of clear liquids prior to procedure as well as Dulcolax 10mg  daily for 3 days prior to prep day.

## 2014-07-19 NOTE — Telephone Encounter (Signed)
MADE APPOINTMENT FOR 07/19/13 AND PATIENT AWARE.

## 2014-07-19 NOTE — Telephone Encounter (Signed)
Pt has been scheduled for 07/26/2014. Daughter is aware and is going to by stopping by on 07/20/2014 to pick up instructions.

## 2014-07-19 NOTE — Patient Instructions (Addendum)
1. We will schedule an urgent colonoscopy with [name]. Please make your best effort at doing the bowel prep to help ensure the best results. 2. We will try and contact pain management doctor to explain the situation and ask them to contact you with pain management instructions. 3. Further recommendations will be based on the results of the procedure.

## 2014-07-19 NOTE — Assessment & Plan Note (Signed)
Continued abdominal pain with CT demonstrated apple core lesion suspicious for CRC and possible peritoneal metastasis. Sees pain management and is under pain contract and unable to change pain regimen. Will reach out to pain management for their participation in helping better manage his pain given the current situation.

## 2014-07-19 NOTE — Progress Notes (Signed)
Ideally, needs to come off Plavix for 5 days so we can clear his left colon colon as I anticipate he may need a right hemicolectomy. We need to get the okay to hold Plavix from the prescribing physician. He will need a little extra prep as well.   He will need propofol in the OR. This will all be best accomplished the first of next week as discussed with Walden Field

## 2014-07-19 NOTE — Patient Instructions (Addendum)
Derrick [name]  07/19/2014   Your procedure is scheduled on:  07/26/2014  Report to [facility] at 7:15 AM.  Call this number if you have problems the morning of surgery: [phone]   Remember:   Do not eat food or drink liquids after midnight.   Take these medicines the morning of surgery with A SIP OF WATER: Lexapro, Oxycodone, Protonix, Diovan   DO NOT TAKE DIABETIC MEDICATION MORNING OF PROCEDURE  TAKE ONLY 1/2 DOSE OF SCHEDULED INSULIN NIGHT PRIOR TO PROCEDURE   Do not wear jewelry, make-up or nail polish.  Do not wear lotions, powders, or perfumes. You may wear deodorant.  Do not shave 48 hours prior to surgery. Men may shave face and neck.  Do not bring valuables to the hospital.  [facility] is not responsible for any belongings or valuables.               Contacts, dentures or bridgework may not be worn into surgery.  Leave suitcase in the car. After surgery it may be brought to your room.  For patients admitted to the hospital, discharge time is determined by your treatment team.               Patients discharged the day of surgery will not be allowed to drive home.  Name and phone number of your driver:   Special Instructions: N/A   Please read over the following fact sheets that you were given: Anesthesia Post-op Instructions   PATIENT INSTRUCTIONS POST-ANESTHESIA  IMMEDIATELY FOLLOWING SURGERY:  Do not drive or operate machinery for the first twenty four hours after surgery.  Do not make any important decisions for twenty four hours after surgery or while taking narcotic pain medications or sedatives.  If you develop intractable nausea and vomiting or a severe headache please notify your doctor immediately.  FOLLOW-UP:  Please make an appointment with your surgeon as instructed. You do not need to follow up with anesthesia unless specifically instructed to do so.  WOUND CARE INSTRUCTIONS (if applicable):  Keep a dry clean dressing on the anesthesia/puncture wound site  if there is drainage.  Once the wound has quit draining you may leave it open to air.  Generally you should leave the bandage intact for twenty four hours unless there is drainage.  If the epidural site drains for more than 36-48 hours please call the anesthesia department.  QUESTIONS?:  Please feel free to call your physician or the hospital operator if you have any questions, and they will be happy to assist you.      Colonoscopy A colonoscopy is an exam to look at the entire large intestine (colon). This exam can help find problems such as tumors, polyps, inflammation, and areas of bleeding. The exam takes about 1 hour.  LET [facility] CARE PROVIDER KNOW ABOUT:   Any allergies you have.  All medicines you are taking, including vitamins, herbs, eye drops, creams, and over-the-counter medicines.  Previous problems you or members of your family have had with the use of anesthetics.  Any blood disorders you have.  Previous surgeries you have had.  Medical conditions you have. RISKS AND COMPLICATIONS  Generally, this is a safe procedure. However, as with any procedure, complications can occur. Possible complications include:  Bleeding.  Tearing or rupture of the colon wall.  Reaction to medicines given during the exam.  Infection (rare). BEFORE THE PROCEDURE   Ask your health care provider about changing or stopping your regular medicines.  You may be prescribed an oral bowel prep. This involves drinking a large amount of medicated liquid, starting the day before your procedure. The liquid will cause you to have multiple loose stools until your stool is almost clear or light green. This cleans out your colon in preparation for the procedure.  Do not eat or drink anything else once you have started the bowel prep, unless your health care provider tells you it is safe to do so.  Arrange for someone to drive you home after the procedure. PROCEDURE   You will be given medicine to  help you relax (sedative).  You will lie on your side with your knees bent.  A long, flexible tube with a light and camera on the end (colonoscope) will be inserted through the rectum and into the colon. The camera sends video back to a computer screen as it moves through the colon. The colonoscope also releases carbon dioxide gas to inflate the colon. This helps your health care provider see the area better.  During the exam, your health care provider may take a small tissue sample (biopsy) to be examined under a microscope if any abnormalities are found.  The exam is finished when the entire colon has been viewed. AFTER THE PROCEDURE   Do not drive for 24 hours after the exam.  You may have a small amount of blood in your stool.  You may pass moderate amounts of gas and have mild abdominal cramping or bloating. This is caused by the gas used to inflate your colon during the exam.  Ask when your test results will be ready and how you will get your results. Make sure you get your test results. Document Released: 06/29/2000 Document Revised: 04/22/2013 Document Reviewed: 03/09/2013 [facility] Patient Information 2015 [place], [place]. This information is not intended to replace advice given to you by your health care provider. Make sure you discuss any questions you have with your health care provider.

## 2014-07-19 NOTE — Telephone Encounter (Signed)
Please have TCS in the OR with propofol with RMR scheduled for Monday 07/26/13.   Prep as ordered. Clear liquids for two days prior to procedure (Sunday and Saturday). Also, have the patient take Dulcolax 10mg  daily for the 3 days prior to prep day (take the dulcolax on Thurs, Friday, and Saturday).    Finally, please have the patient hold his Plavix (OK per Dr. Legrand Rams) for 5 full days prior to procedure (last dose Tuesday 1/5, hold Wed-Sunday 1/6-1/10).

## 2014-07-19 NOTE — Progress Notes (Addendum)
Referring Provider: Rosita Fire, MD Primary Care Physician:  Rosita Fire, MD  Primary GI: [name]  Chief Complaint  Patient presents with  . Abdominal Pain    went to [facility] and they told him he had cancer here to set up Colonoscopy    HPI:   64 year old male presents for ER follow-up. Presented to the ER 2 days ago with abdominal pain. CT Abdomen showed "an 'apple-core' lesion of the hepatic flexure consistent with primary carcinoma of the colon and adjacent nodes in the mesentery may represent metastasis." Had a colonoscopy in 2013 which had numerous ascending colono polyps, sessile, and pedunculated with the largest being 1.5 in the hepatic flexure. Bowel prep was marginal to poor which limited ability to appreciate full extent of the largest lesion. Recommended 3 month follow-up in early 2014 which was scheduled but subsequently cancelled by patient's daughter.  Today his pain is about 8/10 and located primarily periumbilical with radiation to the chest, generalized abdomen, and back. Sees pain management in [place] and is under a pain contract. Is honoring his contract but requesting [place] to contact the pain doctor to explain the situation to be able to work toward better pain management. Admits nausea and vomiting with one episode fo vomiting in the last 24 hours. Also having diarrhea described as "clear liquid" with stool softeners but "unable to go at all without stool softeners." Last colonoscopy prep was poor and patient states that he drank about 1.5 gal of the prep solution but vomited a fari amount of it due to inability to tolerate the sheer volume. Denies any other upper or lower GI symptoms.  Past Medical History  Diagnosis Date  . Diabetes mellitus   . Hypertension   . Thyroid disease   . Back pain   . Arthritis   . Glaucoma   . Heart attack   . Heart disease   . High cholesterol   . Stroke late 1990  . CAD (coronary artery disease)   . Adenomatous polyp  of colon     pt thinks 2004? Dr Lajoyce Corners  . Tubular adenoma 04/16/2012    [name]  . Diverticulosis     Past Surgical History  Procedure Laterality Date  . Carpal tunnel release      twice  . Eyes    . Cardiac catheterization    . Colonoscopy  03/30/2004    Lajoyce Corners- 2 hyperplastic polyps removed  . Circumcision    . Colonoscopy  04/16/2012    [name]- diverticulosis, tubular adenoma  . Cataract extraction w/phaco  04/28/2012    Procedure: CATARACT EXTRACTION PHACO AND INTRAOCULAR LENS PLACEMENT (IOC);  Surgeon: [name], MD;  Location: [facility];  Service: Ophthalmology;  Laterality: Right;  CDE=17.22    Current Outpatient Prescriptions  Medication Sig Dispense Refill  . clopidogrel (PLAVIX) 75 MG tablet Take 75 mg by mouth daily.    [name] escitalopram (LEXAPRO) 20 MG tablet Take 1 tablet (20 mg total) by mouth daily. 30 tablet 2  . insulin glargine (LANTUS) 100 UNIT/ML injection Inject 65 Units into the skin at bedtime.     . metFORMIN (GLUCOPHAGE) 1000 MG tablet Take 1,000 mg by mouth 2 (two) times daily.     [name] oxyCODONE (ROXICODONE) 15 MG immediate release tablet Take 15 mg by mouth 3 (three) times daily as needed for pain.     . pantoprazole (PROTONIX) 40 MG tablet Take 40 mg by mouth daily.    . simvastatin (  ZOCOR) 40 MG tablet Take 40 mg by mouth every evening.    . temazepam (RESTORIL) 15 MG capsule Take 1 capsule (15 mg total) by mouth at bedtime as needed for sleep. 30 capsule 2  . TRADJENTA 5 MG TABS tablet Take 5 mg by mouth daily.     . valsartan-hydrochlorothiazide (DIOVAN-HCT) 160-12.5 MG per tablet Take 1 tablet by mouth daily.     No current facility-administered medications for this visit.    Allergies as of 07/19/2014  . (No Known Allergies)    Family History  Problem Relation Age of Onset  . Diabetes Mother   . Hypertension Mother   . Stroke Brother   . Coronary artery disease Father   . ADD / ADHD Daughter     History   Social History  . Marital Status:  Legally Separated    Spouse Name: N/A    Number of Children: 21  . Years of Education: N/A   Occupational History  . disabled    Social History Main Topics  . Smoking status: Former Smoker -- 1.00 packs/day for 20 years    Types: Cigarettes    Quit date: 03/19/1997  . Smokeless tobacco: Current User    Types: Snuff, Chew  . Alcohol Use: No  . Drug Use: No  . Sexual Activity: Not Currently   Other Topics Concern  . None   Social History Narrative   Lives alone    Review of Systems: Gen: Denies fever, chills, anorexia. Denies fatigue, weakness, weight loss.  CV: Denies chest pain when it radiates up from his abdominal pain. Denies  palpitations, syncope, peripheral edema. Resp: Denies dyspnea at rest, cough, wheezing. Has some shortness of breath associated with exacerbations of abdominal pain. GI: See HPI. Denies vomiting blood, jaundice, and fecal incontinence.   Denies dysphagia or odynophagia. Derm: Denies rash, itching, dry skin Psych: Denies depression, anxiety, memory loss, confusion.   Physical Exam: BP 155/98 mmHg  Pulse 77  Temp(Src) 97.3 F (36.3 C)  Ht 5\' 8"  (1.727 m)  Wt 248 lb 12.8 oz (112.855 kg)  BMI 37.84 kg/m2 General:   Alert and oriented. No distress noted. Pleasant and cooperative.  Head:  Normocephalic and atraumatic. Eyes:  Conjuctiva clear without scleral icterus. Mouth:  Oral mucosa pink and moist. No lesions noted. Neck:  Supple, without mass or thyromegaly. Heart:  S1, S2 present without murmurs, rubs, or gallops. Regular rate and rhythm. Abdomen:  +BS, soft, and non-distended. No rebound or guarding. No HSM or masses noted. Generalized abdominal TTP. Msk:  Symmetrical without gross deformities. Normal posture. Pulses:  2+ DP noted bilaterally Extremities:  Without edema. Neurologic:  Alert and  oriented x4;  grossly normal neurologically. Skin:  Intact without significant lesions or rashes. Cervical Nodes:  No significant cervical  adenopathy. Psych:  Alert and cooperative. Normal mood and affect.    07/19/2014 9:46 AM

## 2014-07-19 NOTE — Telephone Encounter (Signed)
Pre Kim at Dr. Legrand Rams IT IS OK TO HOLD his Plavix. Please advise if I need to change his TCS and if so when do I need to move it to.

## 2014-07-20 ENCOUNTER — Encounter (HOSPITAL_COMMUNITY)
Admission: RE | Admit: 2014-07-20 | Discharge: 2014-07-20 | Disposition: A | Discharge status: Home / Self Care | Payer: Medicare Other | Source: Ambulatory Visit | Attending: Internal Medicine | Admitting: Internal Medicine

## 2014-07-20 ENCOUNTER — Encounter (HOSPITAL_COMMUNITY): Payer: Self-pay

## 2014-07-20 DIAGNOSIS — R9431 Abnormal electrocardiogram [ECG] [EKG]: Secondary | ICD-10-CM | POA: Insufficient documentation

## 2014-07-20 DIAGNOSIS — Z01810 Encounter for preprocedural cardiovascular examination: Secondary | ICD-10-CM | POA: Diagnosis present

## 2014-07-20 DIAGNOSIS — R001 Bradycardia, unspecified: Secondary | ICD-10-CM | POA: Insufficient documentation

## 2014-07-20 NOTE — Progress Notes (Signed)
cc'ed to pcp °

## 2014-07-24 ENCOUNTER — Encounter (HOSPITAL_COMMUNITY): Payer: Self-pay | Admitting: Emergency Medicine

## 2014-07-24 ENCOUNTER — Emergency Department (HOSPITAL_COMMUNITY)
Admission: EM | Admit: 2014-07-24 | Discharge: 2014-07-24 | Disposition: A | Discharge status: Home / Self Care | Payer: Medicare Other | Attending: Emergency Medicine | Admitting: Emergency Medicine

## 2014-07-24 ENCOUNTER — Emergency Department (HOSPITAL_COMMUNITY): Payer: Medicare Other

## 2014-07-24 DIAGNOSIS — Z86010 Personal history of colonic polyps: Secondary | ICD-10-CM | POA: Diagnosis not present

## 2014-07-24 DIAGNOSIS — I10 Essential (primary) hypertension: Secondary | ICD-10-CM | POA: Insufficient documentation

## 2014-07-24 DIAGNOSIS — E780 Pure hypercholesterolemia: Secondary | ICD-10-CM | POA: Insufficient documentation

## 2014-07-24 DIAGNOSIS — K6389 Other specified diseases of intestine: Secondary | ICD-10-CM | POA: Diagnosis not present

## 2014-07-24 DIAGNOSIS — R1084 Generalized abdominal pain: Secondary | ICD-10-CM | POA: Diagnosis present

## 2014-07-24 DIAGNOSIS — G8929 Other chronic pain: Secondary | ICD-10-CM | POA: Insufficient documentation

## 2014-07-24 DIAGNOSIS — I2510 Atherosclerotic heart disease of native coronary artery without angina pectoris: Secondary | ICD-10-CM | POA: Diagnosis not present

## 2014-07-24 DIAGNOSIS — Z87891 Personal history of nicotine dependence: Secondary | ICD-10-CM | POA: Insufficient documentation

## 2014-07-24 DIAGNOSIS — M549 Dorsalgia, unspecified: Secondary | ICD-10-CM | POA: Insufficient documentation

## 2014-07-24 DIAGNOSIS — R109 Unspecified abdominal pain: Secondary | ICD-10-CM

## 2014-07-24 DIAGNOSIS — Z9889 Other specified postprocedural states: Secondary | ICD-10-CM | POA: Insufficient documentation

## 2014-07-24 DIAGNOSIS — E119 Type 2 diabetes mellitus without complications: Secondary | ICD-10-CM | POA: Diagnosis not present

## 2014-07-24 LAB — COMPREHENSIVE METABOLIC PANEL
ALK PHOS: 52 U/L (ref 39–117)
ALT: 10 U/L (ref 0–53)
ANION GAP: 5 (ref 5–15)
AST: 10 U/L (ref 0–37)
Albumin: 3.5 g/dL (ref 3.5–5.2)
BUN: 10 mg/dL (ref 6–23)
CO2: 24 mmol/L (ref 19–32)
CREATININE: 0.95 mg/dL (ref 0.50–1.35)
Calcium: 8.4 mg/dL (ref 8.4–10.5)
Chloride: 108 mEq/L (ref 96–112)
GFR, EST NON AFRICAN AMERICAN: 87 mL/min — AB (ref >90)
GLUCOSE: 148 mg/dL — AB (ref 70–99)
Potassium: 3.6 mmol/L (ref 3.5–5.1)
Sodium: 137 mmol/L (ref 135–145)
Total Bilirubin: 0.5 mg/dL (ref 0.3–1.2)
Total Protein: 6.7 g/dL (ref 6.0–8.3)

## 2014-07-24 LAB — URINALYSIS, ROUTINE W REFLEX MICROSCOPIC
BILIRUBIN URINE: NEGATIVE
Glucose, UA: NEGATIVE mg/dL
HGB URINE DIPSTICK: NEGATIVE
KETONES UR: NEGATIVE mg/dL
Leukocytes, UA: NEGATIVE
NITRITE: NEGATIVE
PH: 5.5 (ref 5.0–8.0)
PROTEIN: NEGATIVE mg/dL
UROBILINOGEN UA: 0.2 mg/dL (ref 0.0–1.0)

## 2014-07-24 LAB — CBC WITH DIFFERENTIAL/PLATELET
BASOS PCT: 0 % (ref 0–1)
Basophils Absolute: 0 10*3/uL (ref 0.0–0.1)
EOS ABS: 0.3 10*3/uL (ref 0.0–0.7)
Eosinophils Relative: 4 % (ref 0–5)
HEMATOCRIT: 33.1 % — AB (ref 39.0–52.0)
Hemoglobin: 11.5 g/dL — ABNORMAL LOW (ref 13.0–17.0)
Lymphocytes Relative: 32 % (ref 12–46)
Lymphs Abs: 2.4 10*3/uL (ref 0.7–4.0)
MCH: 25.6 pg — ABNORMAL LOW (ref 26.0–34.0)
MCHC: 34.7 g/dL (ref 30.0–36.0)
MCV: 73.6 fL — ABNORMAL LOW (ref 78.0–100.0)
MONO ABS: 0.5 10*3/uL (ref 0.1–1.0)
MONOS PCT: 7 % (ref 3–12)
Neutro Abs: 4.4 10*3/uL (ref 1.7–7.7)
Neutrophils Relative %: 57 % (ref 43–77)
PLATELETS: 309 10*3/uL (ref 150–400)
RBC: 4.5 MIL/uL (ref 4.22–5.81)
RDW: 14.9 % (ref 11.5–15.5)
WBC: 7.6 10*3/uL (ref 4.0–10.5)

## 2014-07-24 LAB — POC OCCULT BLOOD, ED: Fecal Occult Bld: POSITIVE — AB

## 2014-07-24 MED ORDER — HYDROMORPHONE HCL 4 MG PO TABS: 4.0000 mg | ORAL_TABLET | ORAL | Status: DC | PRN

## 2014-07-24 MED ORDER — SODIUM CHLORIDE 0.9 % IJ SOLN
INTRAMUSCULAR | Status: AC
  Filled 2014-07-24: qty 30

## 2014-07-24 MED ORDER — SODIUM CHLORIDE 0.9 % IJ SOLN
INTRAMUSCULAR | Status: AC
  Filled 2014-07-24: qty 250

## 2014-07-24 MED ORDER — ONDANSETRON HCL 8 MG PO TABS: 8.0000 mg | ORAL_TABLET | Freq: Three times a day (TID) | ORAL | Status: DC | PRN

## 2014-07-24 MED ORDER — ONDANSETRON HCL 4 MG/2ML IJ SOLN
4.0000 mg | Freq: Once | INTRAMUSCULAR | Status: AC
  Administered 2014-07-24: 4 mg via INTRAVENOUS
  Filled 2014-07-24: qty 2

## 2014-07-24 MED ORDER — HYDROMORPHONE HCL 1 MG/ML IJ SOLN
1.0000 mg | Freq: Once | INTRAMUSCULAR | Status: AC
  Administered 2014-07-24: 1 mg via INTRAVENOUS
  Filled 2014-07-24: qty 1

## 2014-07-24 MED ORDER — IOHEXOL 300 MG/ML  SOLN
50.0000 mL | Freq: Once | INTRAMUSCULAR | Status: AC | PRN
  Administered 2014-07-24: 50 mL via ORAL

## 2014-07-24 NOTE — Discharge Instructions (Signed)
We are giving you medication for nausea and also another medication for pain if you need it. Follow up with your doctor as scheduled on MOnday. Return here as needed.

## 2014-07-24 NOTE — ED Provider Notes (Signed)
CSN: [id number]     Arrival date & time 07/24/14  1025 History   First MD Initiated Contact with Patient 07/24/14 1057     Chief Complaint  Patient presents with  . Abdominal Pain     (Consider location/radiation/quality/duration/timing/severity/associated sxs/prior Treatment) Patient is a 64 y.o. male presenting with abdominal pain. The history is provided by the patient.  Abdominal Pain Pain location:  Generalized Pain quality: shooting   Pain radiates to:  Back Pain severity:  Severe Timing:  Constant Progression:  Worsening Relieved by:  Nothing Associated symptoms: diarrhea, nausea and vomiting   Associated symptoms: no chest pain, no chills, no cough, no dysuria, no fever and no shortness of breath    Derrick [name] is a 64 y.o. male who presents to the ED with abdominal pain. He was evaluated here one week ago for abdominal pain and had a CT that showed possible cancer. He has been for follow up with GI and is scheduled for colonoscopy the first of the week. He was feeling a little better when taking the oxycodone but he has doubled up on it and continues to have severe pain. Last night the pain got worse with n/v/d. He reports vomiting x 4 and diarrhea multiple times. The emesis is yellow watery and the stool is like black water. He rates his pain as 9/10.   Past Medical History  Diagnosis Date  . Diabetes mellitus   . Hypertension   . Thyroid disease   . Back pain   . Arthritis   . Glaucoma   . Heart attack   . Heart disease   . High cholesterol   . Stroke late 1990  . CAD (coronary artery disease)   . Adenomatous polyp of colon     pt thinks 2004? Dr Lajoyce Corners  . Tubular adenoma 04/16/2012    [name]  . Diverticulosis    Past Surgical History  Procedure Laterality Date  . Carpal tunnel release      twice  . Eyes    . Cardiac catheterization    . Colonoscopy  03/30/2004    Lajoyce Corners- 2 hyperplastic polyps removed  . Circumcision    . Colonoscopy  04/16/2012    Dr.  Gala Romney- diverticulosis, tubular adenoma  . Cataract extraction w/phaco  04/28/2012    Procedure: CATARACT EXTRACTION PHACO AND INTRAOCULAR LENS PLACEMENT (IOC);  Surgeon: [name], MD;  Location: [facility];  Service: Ophthalmology;  Laterality: Right;  CDE=17.22   Family History  Problem Relation Age of Onset  . Diabetes Mother   . Hypertension Mother   . Stroke Brother   . Coronary artery disease Father   . ADD / ADHD Daughter    History  Substance Use Topics  . Smoking status: Former Smoker -- 1.00 packs/day for 20 years    Types: Cigarettes    Quit date: 03/19/1997  . Smokeless tobacco: Current User    Types: Snuff, Chew  . Alcohol Use: No    Review of Systems  Constitutional: Negative for fever and chills.  HENT: Negative.   Eyes: Negative for photophobia, pain and visual disturbance.  Respiratory: Negative for cough, shortness of breath and wheezing.   Cardiovascular: Negative for chest pain and leg swelling.  Gastrointestinal: Positive for nausea, vomiting, abdominal pain, diarrhea and abdominal distention.  Genitourinary: Negative for dysuria, urgency, frequency, penile swelling, scrotal swelling and penile pain.  Musculoskeletal: Positive for back pain (chronic). Neck pain: chronic.  Skin: Negative for rash.  Neurological: Negative for  syncope, light-headedness and headaches.  Psychiatric/Behavioral: Negative for confusion. The patient is not nervous/anxious.       Allergies  Review of patient's allergies indicates no known allergies.  Home Medications   Prior to Admission medications   Medication Sig Start Date End Date Taking? Authorizing Provider  clopidogrel (PLAVIX) 75 MG tablet Take 75 mg by mouth daily.   Yes Historical Provider, MD  escitalopram (LEXAPRO) 20 MG tablet Take 1 tablet (20 mg total) by mouth daily. 06/16/14 06/16/15 Yes Levonne Spiller, MD  insulin glargine (LANTUS) 100 UNIT/ML injection Inject 65 Units into the skin at bedtime.    Yes Historical  Provider, MD  metFORMIN (GLUCOPHAGE) 1000 MG tablet Take 1,000 mg by mouth 2 (two) times daily.    Yes Historical Provider, MD  oxyCODONE (ROXICODONE) 15 MG immediate release tablet Take 15 mg by mouth 3 (three) times daily as needed for pain.  04/04/14  Yes Historical Provider, MD  pantoprazole (PROTONIX) 40 MG tablet Take 40 mg by mouth daily.   Yes Historical Provider, MD  simvastatin (ZOCOR) 40 MG tablet Take 40 mg by mouth every evening.   Yes Historical Provider, MD  temazepam (RESTORIL) 15 MG capsule Take 1 capsule (15 mg total) by mouth at bedtime as needed for sleep. 05/20/14  Yes Levonne Spiller, MD  TRADJENTA 5 MG TABS tablet Take 5 mg by mouth daily.  03/16/14  Yes Historical Provider, MD  valsartan-hydrochlorothiazide (DIOVAN-HCT) 160-12.5 MG per tablet Take 1 tablet by mouth daily.   Yes Historical Provider, MD  HYDROmorphone (DILAUDID) 4 MG tablet Take 1 tablet (4 mg total) by mouth every 4 (four) hours as needed for severe pain. 07/24/14   Hope Bunnie Pion, NP  ondansetron (ZOFRAN) 8 MG tablet Take 1 tablet (8 mg total) by mouth every 8 (eight) hours as needed for nausea or vomiting. 07/24/14   Hope Bunnie Pion, NP  peg 3350 powder (MOVIPREP) 100 G SOLR Take 1 kit (200 g total) by mouth as directed. 07/19/14   [name], MD   BP 158/97 mmHg  Pulse 63  Temp(Src) 98.7 F (37.1 C) (Oral)  Resp 17  Ht 5' 8" (1.727 m)  Wt 252 lb (114.306 kg)  BMI 38.33 kg/m2  SpO2 97% Physical Exam  Constitutional: He is oriented to person, place, and time. He appears well-developed and well-nourished. No distress.  HENT:  Head: Normocephalic and atraumatic.  Eyes: EOM are normal.  Neck: Normal range of motion. Neck supple.  Cardiovascular: Normal rate.   Pulmonary/Chest: Effort normal.  Abdominal: He exhibits distension. Bowel sounds are increased. There is generalized tenderness. There is no CVA tenderness.  Musculoskeletal: Normal range of motion.  Neurological: He is alert and oriented to person, place,  and time. No cranial nerve deficit.  Skin: Skin is warm and dry.  Psychiatric: He has a normal mood and affect. His behavior is normal.  Nursing note and vitals reviewed.   ED Course  Procedures  Results for orders placed or performed during the hospital encounter of 07/24/14 (from the past 24 hour(s))  CBC with Differential     Status: Abnormal   Collection Time: 07/24/14 11:22 AM  Result Value Ref Range   WBC 7.6 4.0 - 10.5 K/uL   RBC 4.50 4.22 - 5.81 MIL/uL   Hemoglobin 11.5 (L) 13.0 - 17.0 g/dL   HCT 33.1 (L) 39.0 - 52.0 %   MCV 73.6 (L) 78.0 - 100.0 fL   MCH 25.6 (L) 26.0 - 34.0 pg  MCHC 34.7 30.0 - 36.0 g/dL   RDW 14.9 11.5 - 15.5 %   Platelets 309 150 - 400 K/uL   Neutrophils Relative % 57 43 - 77 %   Lymphocytes Relative 32 12 - 46 %   Monocytes Relative 7 3 - 12 %   Eosinophils Relative 4 0 - 5 %   Basophils Relative 0 0 - 1 %   Neutro Abs 4.4 1.7 - 7.7 K/uL   Lymphs Abs 2.4 0.7 - 4.0 K/uL   Monocytes Absolute 0.5 0.1 - 1.0 K/uL   Eosinophils Absolute 0.3 0.0 - 0.7 K/uL   Basophils Absolute 0.0 0.0 - 0.1 K/uL   RBC Morphology MICROCYTES    Smear Review LARGE PLATELETS PRESENT   Comprehensive metabolic panel     Status: Abnormal   Collection Time: 07/24/14 11:22 AM  Result Value Ref Range   Sodium 137 135 - 145 mmol/L   Potassium 3.6 3.5 - 5.1 mmol/L   Chloride 108 96 - 112 mEq/L   CO2 24 19 - 32 mmol/L   Glucose, Bld 148 (H) 70 - 99 mg/dL   BUN 10 6 - 23 mg/dL   Creatinine, Ser 0.95 0.50 - 1.35 mg/dL   Calcium 8.4 8.4 - 10.5 mg/dL   Total Protein 6.7 6.0 - 8.3 g/dL   Albumin 3.5 3.5 - 5.2 g/dL   AST 10 0 - 37 U/L   ALT 10 0 - 53 U/L   Alkaline Phosphatase 52 39 - 117 U/L   Total Bilirubin 0.5 0.3 - 1.2 mg/dL   GFR calc non Af Amer 87 (L) >90 mL/min   GFR calc Af Amer >90 >90 mL/min   Anion gap 5 5 - 15  Urinalysis, Routine w reflex microscopic     Status: Abnormal   Collection Time: 07/24/14  2:19 PM  Result Value Ref Range   Color, Urine YELLOW  YELLOW   APPearance HAZY (A) CLEAR   Specific Gravity, Urine >1.030 (H) 1.005 - 1.030   pH 5.5 5.0 - 8.0   Glucose, UA NEGATIVE NEGATIVE mg/dL   Hgb urine dipstick NEGATIVE NEGATIVE   Bilirubin Urine NEGATIVE NEGATIVE   Ketones, ur NEGATIVE NEGATIVE mg/dL   Protein, ur NEGATIVE NEGATIVE mg/dL   Urobilinogen, UA 0.2 0.0 - 1.0 mg/dL   Nitrite NEGATIVE NEGATIVE   Leukocytes, UA NEGATIVE NEGATIVE  POC occult blood, ED Provider will collect     Status: Abnormal   Collection Time: 07/24/14  3:12 PM  Result Value Ref Range   Fecal Occult Bld POSITIVE (A) NEGATIVE    Ct Abdomen Pelvis Wo Contrast  07/24/2014   CLINICAL DATA:  Nausea, vomiting, and diarrhea with sharp pain in mid abdomen  EXAM: CT ABDOMEN AND PELVIS WITHOUT CONTRAST  TECHNIQUE: Multidetector CT imaging of the abdomen and pelvis was performed following the standard protocol without IV contrast. Oral contrast was administered.  COMPARISON:  July 17, 2014  FINDINGS: There is a calcified granuloma in the right base region. There is a calcified right hilar lymph node. There is mild bibasilar lung atelectasis.  Multiple metallic pellets are noted throughout the abdominal and pelvic wall regions. There are metallic pellets adjacent to the liver anteriorly on the right.  No focal liver lesions are identified on this noncontrast enhanced study. Gallbladder wall is not thickened. There is no appreciable biliary duct dilatation.  There are splenic granulomas.  Spleen otherwise appears normal.  Pancreas and adrenals appear normal. Kidneys bilaterally show no mass or hydronephrosis on  either side. There is no renal or ureteral calculus on either side.  In the pelvis, the wall of the urinary bladder is borderline thickened anteriorly. There is no pelvic mass or pelvic fluid collection.  There is evidence strongly suggesting mass lesion in the distal at ascending colon extending to the hepatic flexure. There is thickening of the wall of the colon  throughout the ascending colon, with ascending colonic wall thickening increased from recent prior study. There is no bowel obstruction. No free air or portal venous air. There is a small ventral hernia containing only fat. Appendix appears normal.  Small lymph nodes near the ascending colon are again noted. There is no new lymph node prominence. There is no frank adenopathy by size criteria.  No ascites or abscess is seen in the abdomen or pelvis. There is atherosclerotic change in the aorta but no aneurysm. There are no blastic or lytic bone lesions.  IMPRESSION: The lesion near the hepatic flexure remains present and is quite suspicious for neoplasm. There is increase in thickening of the wall of the cecum and ascending colon compared to recent prior study, likely due to inflammation proximal to the obstructing lesion near the hepatic flexure. There is no bowel obstruction. No abscess. Appendix appears normal. Small lymph nodes adjacent to the at ascending colon potentially may have neoplastic etiology but cannot be classified as adenopathy by size criteria.  Evidence of prior granulomatous disease. Stable metallic pellets throughout the abdomen and pelvis.  Small ventral hernia containing only fat.   Electronically Signed   By: Lowella Grip M.D.   On: 07/24/2014 13:38   After pain management and medication for nausea the patient states he feels much better and would like to go home.  MDM  64 y.o. male with abdominal pain and recently dx mass with possible colon cancer. Stable for d/c with pain management and medication for nausea. Hemoglobin stable although stool guaiac positive.   Discussed with the patient and all questioned fully answered. He will call return if any problems arise.   Final diagnoses:  Abdominal pain  Mass of colon     [facility], NP 07/24/14 [place] Ray, MD 07/25/14 [phone]

## 2014-07-24 NOTE — ED Notes (Signed)
Patient with no complaints at this time. Respirations even and unlabored. Skin warm/dry. Discharge instructions reviewed with patient at this time. Patient given opportunity to voice concerns/ask questions. IV removed per policy and band-aid applied to site. Patient discharged at this time and left Emergency Department with steady gait.  

## 2014-07-24 NOTE — ED Notes (Signed)
Pt states he finished oral contrast about 30 min ago, CT notified.

## 2014-07-24 NOTE — ED Notes (Signed)
PT states he has been having n/v/d since last night with sharp pains to middle abdomen. PT denies any urinary symptoms and states recently seen in ED and dx with possible colon cancer with appointment colonoscopy Monday.

## 2014-07-24 NOTE — ED Notes (Signed)
Pt given PO fluids.

## 2014-07-26 ENCOUNTER — Telehealth: Payer: Self-pay | Admitting: Internal Medicine

## 2014-07-26 ENCOUNTER — Ambulatory Visit (HOSPITAL_COMMUNITY): Payer: Medicare Other | Admitting: Anesthesiology

## 2014-07-26 ENCOUNTER — Encounter (HOSPITAL_COMMUNITY)
Admission: RE | Disposition: A | Discharge status: Home / Self Care | Payer: Self-pay | Source: Ambulatory Visit | Attending: Internal Medicine

## 2014-07-26 ENCOUNTER — Encounter (HOSPITAL_COMMUNITY): Payer: Self-pay | Admitting: *Deleted

## 2014-07-26 ENCOUNTER — Ambulatory Visit (HOSPITAL_COMMUNITY)
Admission: RE | Admit: 2014-07-26 | Discharge: 2014-07-26 | Disposition: A | Discharge status: Home / Self Care | Payer: Medicare Other | Source: Ambulatory Visit | Attending: Internal Medicine | Admitting: Internal Medicine

## 2014-07-26 ENCOUNTER — Other Ambulatory Visit: Payer: Self-pay

## 2014-07-26 DIAGNOSIS — Z8673 Personal history of transient ischemic attack (TIA), and cerebral infarction without residual deficits: Secondary | ICD-10-CM | POA: Insufficient documentation

## 2014-07-26 DIAGNOSIS — G4730 Sleep apnea, unspecified: Secondary | ICD-10-CM | POA: Diagnosis not present

## 2014-07-26 DIAGNOSIS — Z87891 Personal history of nicotine dependence: Secondary | ICD-10-CM | POA: Insufficient documentation

## 2014-07-26 DIAGNOSIS — D122 Benign neoplasm of ascending colon: Secondary | ICD-10-CM | POA: Insufficient documentation

## 2014-07-26 DIAGNOSIS — Z9911 Dependence on respirator [ventilator] status: Secondary | ICD-10-CM | POA: Diagnosis not present

## 2014-07-26 DIAGNOSIS — D124 Benign neoplasm of descending colon: Secondary | ICD-10-CM | POA: Insufficient documentation

## 2014-07-26 DIAGNOSIS — E039 Hypothyroidism, unspecified: Secondary | ICD-10-CM | POA: Diagnosis not present

## 2014-07-26 DIAGNOSIS — I10 Essential (primary) hypertension: Secondary | ICD-10-CM | POA: Insufficient documentation

## 2014-07-26 DIAGNOSIS — D123 Benign neoplasm of transverse colon: Secondary | ICD-10-CM | POA: Insufficient documentation

## 2014-07-26 DIAGNOSIS — I252 Old myocardial infarction: Secondary | ICD-10-CM | POA: Insufficient documentation

## 2014-07-26 DIAGNOSIS — Z860100 Personal history of colon polyps, unspecified: Secondary | ICD-10-CM | POA: Insufficient documentation

## 2014-07-26 DIAGNOSIS — K6389 Other specified diseases of intestine: Secondary | ICD-10-CM | POA: Diagnosis present

## 2014-07-26 DIAGNOSIS — C189 Malignant neoplasm of colon, unspecified: Secondary | ICD-10-CM

## 2014-07-26 DIAGNOSIS — Z955 Presence of coronary angioplasty implant and graft: Secondary | ICD-10-CM | POA: Insufficient documentation

## 2014-07-26 DIAGNOSIS — I2510 Atherosclerotic heart disease of native coronary artery without angina pectoris: Secondary | ICD-10-CM | POA: Insufficient documentation

## 2014-07-26 DIAGNOSIS — E119 Type 2 diabetes mellitus without complications: Secondary | ICD-10-CM | POA: Insufficient documentation

## 2014-07-26 HISTORY — PX: BIOPSY: SHX5522

## 2014-07-26 HISTORY — PX: POLYPECTOMY: SHX5525

## 2014-07-26 HISTORY — PX: COLONOSCOPY WITH PROPOFOL: SHX5780

## 2014-07-26 LAB — GLUCOSE, CAPILLARY
Glucose-Capillary: 110 mg/dL — ABNORMAL HIGH (ref 70–99)
Glucose-Capillary: 100 mg/dL — ABNORMAL HIGH (ref 70–99)

## 2014-07-26 SURGERY — COLONOSCOPY WITH PROPOFOL
Anesthesia: Monitor Anesthesia Care | Site: Rectum

## 2014-07-26 MED ORDER — MIDAZOLAM HCL 2 MG/2ML IJ SOLN
INTRAMUSCULAR | Status: AC
  Filled 2014-07-26: qty 2

## 2014-07-26 MED ORDER — DEXAMETHASONE SODIUM PHOSPHATE 4 MG/ML IJ SOLN
8.0000 mg | Freq: Once | INTRAMUSCULAR | Status: AC
  Administered 2014-07-26: 8 mg via INTRAVENOUS

## 2014-07-26 MED ORDER — PROPOFOL INFUSION 10 MG/ML OPTIME
INTRAVENOUS | Status: DC | PRN
  Administered 2014-07-26: 50 ug/kg/min via INTRAVENOUS
  Administered 2014-07-26 (×2): via INTRAVENOUS

## 2014-07-26 MED ORDER — STERILE WATER FOR IRRIGATION IR SOLN
Status: DC | PRN
  Administered 2014-07-26: 09:00:00

## 2014-07-26 MED ORDER — LIDOCAINE HCL (PF) 1 % IJ SOLN
INTRAMUSCULAR | Status: AC
  Filled 2014-07-26: qty 5

## 2014-07-26 MED ORDER — FENTANYL CITRATE 0.05 MG/ML IJ SOLN: 25.0000 ug | INTRAMUSCULAR | Status: DC | PRN

## 2014-07-26 MED ORDER — LACTATED RINGERS IV SOLN
INTRAVENOUS | Status: DC
  Administered 2014-07-26: 08:00:00 via INTRAVENOUS

## 2014-07-26 MED ORDER — MIDAZOLAM HCL 5 MG/5ML IJ SOLN
INTRAMUSCULAR | Status: DC | PRN
Start: 2014-07-26 — End: 2014-07-26
  Administered 2014-07-26: 2 mg via INTRAVENOUS

## 2014-07-26 MED ORDER — ONDANSETRON HCL 4 MG/2ML IJ SOLN
INTRAMUSCULAR | Status: AC
  Filled 2014-07-26: qty 2

## 2014-07-26 MED ORDER — SIMETHICONE 40 MG/0.6ML PO SUSP
ORAL | Status: AC
  Filled 2014-07-26: qty 0.6

## 2014-07-26 MED ORDER — FENTANYL CITRATE 0.05 MG/ML IJ SOLN
INTRAMUSCULAR | Status: AC
  Filled 2014-07-26: qty 2

## 2014-07-26 MED ORDER — ONDANSETRON HCL 4 MG/2ML IJ SOLN: 4.0000 mg | Freq: Once | INTRAMUSCULAR | Status: DC | PRN

## 2014-07-26 MED ORDER — MIDAZOLAM HCL 2 MG/2ML IJ SOLN
1.0000 mg | INTRAMUSCULAR | Status: DC | PRN
  Administered 2014-07-26: 2 mg via INTRAVENOUS

## 2014-07-26 MED ORDER — ONDANSETRON HCL 4 MG/2ML IJ SOLN
4.0000 mg | Freq: Once | INTRAMUSCULAR | Status: AC
  Administered 2014-07-26: 4 mg via INTRAVENOUS

## 2014-07-26 MED ORDER — DEXAMETHASONE SODIUM PHOSPHATE 4 MG/ML IJ SOLN
INTRAMUSCULAR | Status: AC
  Filled 2014-07-26: qty 2

## 2014-07-26 MED ORDER — PROPOFOL 10 MG/ML IV BOLUS
INTRAVENOUS | Status: AC
  Filled 2014-07-26: qty 20

## 2014-07-26 MED ORDER — DIPHENHYDRAMINE HCL 50 MG/ML IJ SOLN
25.0000 mg | Freq: Once | INTRAMUSCULAR | Status: AC
  Administered 2014-07-26: 25 mg via INTRAVENOUS

## 2014-07-26 MED ORDER — FENTANYL CITRATE 0.05 MG/ML IJ SOLN
25.0000 ug | INTRAMUSCULAR | Status: AC
  Administered 2014-07-26 (×2): 25 ug via INTRAVENOUS

## 2014-07-26 MED ORDER — DIPHENHYDRAMINE HCL 50 MG/ML IJ SOLN
INTRAMUSCULAR | Status: AC
  Filled 2014-07-26: qty 1

## 2014-07-26 SURGICAL SUPPLY — 20 items
ELECT REM PT RETURN 9FT ADLT (ELECTROSURGICAL) ×3
ELECTRODE REM PT RTRN 9FT ADLT (ELECTROSURGICAL) IMPLANT
FCP BXJMBJMB 240X2.8X (CUTTING FORCEPS) ×1
FLOOR PAD 36X40 (MISCELLANEOUS) ×3
FORCEPS BIOP RJ4 240 W/NDL (CUTTING FORCEPS) ×3
FORCEPS BXJMBJMB 240X2.8X (CUTTING FORCEPS) IMPLANT
FORMALIN 10 PREFIL 20ML (MISCELLANEOUS) ×6 IMPLANT
KIT CLEAN ENDO COMPLIANCE (KITS) ×3 IMPLANT
LUBRICANT JELLY 4.5OZ STERILE (MISCELLANEOUS) ×2 IMPLANT
MANIFOLD NEPTUNE II (INSTRUMENTS) ×2 IMPLANT
MARKER SPOT ENDO TATTOO 5ML (MISCELLANEOUS) IMPLANT
NDL SCLEROTHERAPY 25GX240 (NEEDLE) IMPLANT
NEEDLE SCLEROTHERAPY 25GX240 (NEEDLE) ×3 IMPLANT
PAD FLOOR 36X40 (MISCELLANEOUS) IMPLANT
SNARE ROTATE MED OVAL 20MM (MISCELLANEOUS) ×2 IMPLANT
SPOT EX ENDOSCOPIC TATTOO (MISCELLANEOUS) ×4
SYR 50ML LL SCALE MARK (SYRINGE) ×2 IMPLANT
TRAP SPECIMEN MUCOUS 40CC (MISCELLANEOUS) ×4 IMPLANT
TUBING IRRIGATION ENDOGATOR (MISCELLANEOUS) ×2 IMPLANT
WATER STERILE IRR 1000ML POUR (IV SOLUTION) ×2 IMPLANT

## 2014-07-26 NOTE — Interval H&P Note (Signed)
History and Physical Interval Note:  07/26/2014 9:06 AM  Derrick [name]  has presented today for surgery, with the diagnosis of colon mass  The various methods of treatment have been discussed with the patient and family. After consideration of risks, benefits and other options for treatment, the patient has consented to  Procedure(s): COLONOSCOPY WITH PROPOFOL (N/A) as a surgical intervention .  The patient's history has been reviewed, patient examined, no change in status, stable for surgery.  I have reviewed the patient's chart and labs.  Questions were answered to the patient's satisfaction.     Derrick [name]  Ongoing right-sided abdominal pain. Presented to the ED 2 days ago. Follow-up CT scan without contrast demonstrated no new findings. Colonoscopy today per plan. Anticipate surgical consultation in the near future as discussed with patient and family members prior to sedation.  The risks, benefits, limitations, alternatives and imponderables have been reviewed with the patient. Questions have been answered. All parties are agreeable.

## 2014-07-26 NOTE — H&P (View-Only) (Signed)
Ideally, needs to come off Plavix for 5 days so we can clear his left colon colon as I anticipate he may need a right hemicolectomy. We need to get the okay to hold Plavix from the prescribing physician. He will need a little extra prep as well.   He will need propofol in the OR. This will all be best accomplished the first of next week as discussed with Wal[place] Field

## 2014-07-26 NOTE — Discharge Instructions (Addendum)
°  Colonoscopy Discharge Instructions  Read the instructions outlined below and refer to this sheet in the next few weeks. These discharge instructions provide you with general information on caring for yourself after you leave the hospital. Your doctor may also give you specific instructions. While your treatment has been planned according to the most current medical practices available, unavoidable complications occasionally occur. If you have any problems or questions after discharge, call [name] at [phone]. ACTIVITY  You may resume your regular activity, but move at a slower pace for the next 24 hours.   Take frequent rest periods for the next 24 hours.   Walking will help get rid of the air and reduce the bloated feeling in your belly (abdomen).   No driving for 24 hours (because of the medicine (anesthesia) used during the test).    Do not sign any important legal documents or operate any machinery for 24 hours (because of the anesthesia used during the test).  NUTRITION  Drink plenty of fluids.   You may resume your normal diet as instructed by your doctor.   Begin with a light meal and progress to your normal diet. Heavy or fried foods are harder to digest and may make you feel sick to your stomach (nauseated).   Avoid alcoholic beverages for 24 hours or as instructed.  MEDICATIONS  You may resume your normal medications unless your doctor tells you otherwise.  WHAT YOU CAN EXPECT TODAY  Some feelings of bloating in the abdomen.   Passage of more gas than usual.   Spotting of blood in your stool or on the toilet paper.  IF YOU HAD POLYPS REMOVED DURING THE COLONOSCOPY:  No aspirin products for 7 days or as instructed.   No alcohol for 7 days or as instructed.   Eat a soft diet for the next 24 hours.  FINDING OUT THE RESULTS OF YOUR TEST Not all test results are available during your visit. If your test results are not back during the visit, make an appointment  with your caregiver to find out the results. Do not assume everything is normal if you have not heard from your caregiver or the medical facility. It is important for you to follow up on all of your test results.  SEEK IMMEDIATE MEDICAL ATTENTION IF:  You have more than a spotting of blood in your stool.   Your belly is swollen (abdominal distention).   You are nauseated or vomiting.   You have a temperature over 101.   You have abdominal pain or discomfort that is severe or gets worse throughout the day.    You have a tumor in your colon and polyps that were removed today.  You need to have surgery to remove the tumor in your colon. As you requested, will make an appointment to see one of the surgeons at [facility] surgery in [place]  Resume Plavix on January 13  Further recommendations to follow pending review of pathology report

## 2014-07-26 NOTE — Op Note (Signed)
[facility] [address] [place], [address]   COLONOSCOPY PROCEDURE REPORT  PATIENT: Derrick [name], Derrick [name]  MR#: [id number] BIRTHDATE: 30-May-1951 , 2  yrs. old GENDER: male ENDOSCOPIST: R.  Garfield Cornea, MD FACP [facility] REFERRED XT:[id number] Legrand Rams, M.D. PROCEDURE DATE:  August 05, 2014 PROCEDURE:   Colonoscopy, diagnostic, Colonoscopy with biopsy, and Colonoscopy with snare polypectomy INDICATIONS:hepatic flexure tumor seen on CT.[name] MEDICATIONS: Deep sedation per Dr.  Mickel Fuchs. ASA CLASS:       Class III  CONSENT: The risks, benefits, alternatives and imponderables including but not limited to bleeding, perforation as well as the possibility of a missed lesion have been reviewed.  The potential for biopsy, lesion removal, etc. have also been discussed. Questions have been answered.  All parties agreeable.  Please see the history and physical in the medical record for more information.  DESCRIPTION OF PROCEDURE:   After the risks benefits and alternatives of the procedure were thoroughly explained, informed consent was obtained.  The digital rectal exam      The endoscope was introduced through the anus and advanced to the cecum, which was identified by both the appendix and ileocecal valve. No adverse events experienced.   The quality of the prep was adequate.  The instrument was then slowly withdrawn as the colon was fully examined.      COLON FINDINGS: Somewhat elongated, redundant colon.  At the hepatic flexure/distal ascending segment, there was an ulcerated apple core exophytic appearing neoplasm producing significant encroachment on the lumen at this level.  The length was proximally 5 cm.  Had some difficulty getting the diagnostic adult colonoscope through this segment; I was able to negotiate it and make to the cecum.  The mucosa from the cecum to the hepatic flexure lesion appeared normal.  Distal to the hepatic flexure lesion, there was a 1  cm peduncular polyp in the proximal transverse segment in between 2 folds and there was (1) 6 mm polyp in the mid descending segment. The remainder of the colonic mucosa as well as the rectal mucosa appeared normal.  The apple core lesion at the hepatic flexure was biopsied with jumbo biopsy forceps.  The colon was circumferentially tattooed 3 cm distal to the distal extent of this tumor.  The pedunculated polyp in the proximal transverse colon was removed with the hot snare.  Minimal amount of residual tissue was ablated with the tip of a hot snare loop.  The descending colon polyps removed with the hot snare loop.  The patient tolerated the procedure well.  Retroflexion was performed. .  Withdrawal time=29 minutes 0 seconds.  The scope was withdrawn and the procedure completed. COMPLICATIONS: There were no immediate complications.  ENDOSCOPIC IMPRESSION:  "apple core" neoplastic appearing process involving the distal ascending/hepatic flexure producing significant encroachment on the lumen?"biopsied. Transverse colon and descending colon polyps - removed as described above. Colon tattooed 3 cm distal to the distal extent of the hepatic flexure tumor.  RECOMMENDATIONS: Follow-up on pathology. Patient will need a right hemicolectomy to include the tattooed segment to ensure all the tumor is removed. Patient and family desire referral to [facility] for surgery. I will take the liberty of making an appointment to see one of the surgeons at [facility] surgery.  eSigned:  R. Garfield Cornea, MD Rosalita Chessman [facility] August 05, 2014 10:35 AM   cc:  CPT CODES: ICD CODES:  The ICD and CPT codes recommended by this software are interpretations from the data that the clinical staff has captured with the  software.  The verification of the translation of this report to the ICD and CPT codes and modifiers is the sole responsibility of the health care institution and practicing physician where  this report was generated.  [employer] will not be held responsible for the validity of the ICD and CPT codes included on this report.  AMA assumes no liability for data contained or not contained herein. CPT is a Designer, television/film set of the [employer].  PATIENT NAME:  Derrick [name], Derrick [name] MR#: [id number]

## 2014-07-26 NOTE — Telephone Encounter (Signed)
Nolene Ebbs at short stay called to say that patient had procedure done today by RMR and he has colon cancer and [facility]MR wanted him seen by [facility] Surgery. Olivia Mackie said that she contacted [facility], but we needed to send the paper work of the referral over there to them. Fax # [phone] ATT: Judson Roch

## 2014-07-26 NOTE — Transfer of Care (Signed)
Immediate Anesthesia Transfer of Care Note  Patient: Derrick [name]  Procedure(s) Performed: Procedure(s) with comments: COLONOSCOPY WITH PROPOFOL (N/A) - in cecum @ [phone]; cecal withdrawal time= 29 minutes; colon mass tattooed per MD BIOPSY (N/A) - ascending colon mass biopsy POLYPECTOMY (N/A) - transverse colon polyp, descending colon polyp  Patient Location: PACU  Anesthesia Type:MAC  Level of Consciousness: awake and patient cooperative  Airway & Oxygen Therapy: Patient Spontanous Breathing and Patient connected to face mask oxygen  Post-op Assessment: Report given to PACU RN, Post -op Vital signs reviewed and stable and Patient moving all extremities  Post vital signs: Reviewed and stable  Complications: No apparent anesthesia complications

## 2014-07-26 NOTE — Telephone Encounter (Signed)
Faxed referral information to [facility].

## 2014-07-26 NOTE — Telephone Encounter (Signed)
      Derrick [name] at 07/26/2014 1:48 PM     Status: Signed       Expand All Collapse All   Derrick [name], please forward this to whomever scheduled him for his procedure, please.            Theadora Rama at 07/26/2014 1:35 PM     Status: Signed       Expand All Collapse All   On 08/27/12 LAL documented that the patient's daughter had called Endo and stated that patient would not be coming for his procedure and they didn't know when they could call back to [facility]. The daughter listed as his emergency contact is Geordan Xu and she can be reached at [phone].

## 2014-07-26 NOTE — Telephone Encounter (Signed)
Done

## 2014-07-26 NOTE — Anesthesia Postprocedure Evaluation (Signed)
  Anesthesia Post-op Note  Patient: Derrick [name]  Procedure(s) Performed: Procedure(s) with comments: COLONOSCOPY WITH PROPOFOL (N/A) - in cecum @ [phone]; cecal withdrawal time= 29 minutes; colon mass tattooed per MD BIOPSY (N/A) - ascending colon mass biopsy POLYPECTOMY (N/A) - transverse colon polyp, descending colon polyp  Patient Location: PACU  Anesthesia Type:MAC  Level of Consciousness: awake, alert , oriented and patient cooperative  Airway and Oxygen Therapy: Patient Spontanous Breathing  Post-op Pain: none  Post-op Assessment: Post-op Vital signs reviewed, Patient's Cardiovascular Status Stable, Respiratory Function Stable, Patent Airway, No signs of Nausea or vomiting and Pain level controlled  Post-op Vital Signs: Reviewed and stable  Last Vitals:  Filed Vitals:   07/26/14 1045  BP: 134/86  Pulse: 50  Temp:   Resp: 17    Complications: No apparent anesthesia complications

## 2014-07-26 NOTE — Telephone Encounter (Signed)
Derrick [name], please forward this to whomever scheduled him for his procedure, please.

## 2014-07-26 NOTE — Anesthesia Preprocedure Evaluation (Signed)
Anesthesia Evaluation  Patient identified by MRN, date of birth, ID band Patient awake    Reviewed: Allergy & Precautions, H&P , NPO status , Patient's Chart, lab work & pertinent test results  Airway Mallampati: III  TM Distance: >3 FB     Dental  (+) Teeth Intact   Pulmonary sleep apnea and Continuous Positive Airway Pressure Ventilation , former smoker,    Pulmonary exam normal       Cardiovascular hypertension, Pt. on medications - angina+ CAD, + Past MI and + Cardiac Stents Rhythm:Regular Rate:Normal     Neuro/Psych CVA, Residual Symptoms    GI/Hepatic   Endo/Other  diabetes, Well Controlled, Type 2, Oral Hypoglycemic AgentsHypothyroidism   Renal/GU      Musculoskeletal   Abdominal   Peds  Hematology   Anesthesia Other Findings   Reproductive/Obstetrics                             Anesthesia Physical Anesthesia Plan  ASA: III  Anesthesia Plan: MAC   Post-op Pain Management:    Induction: Intravenous  Airway Management Planned: Simple Face Mask  Additional Equipment:   Intra-op Plan:   Post-operative Plan:   Informed Consent: I have reviewed the patients History and Physical, chart, labs and discussed the procedure including the risks, benefits and alternatives for the proposed anesthesia with the patient or authorized representative who has indicated his/her understanding and acceptance.     Plan Discussed with:   Anesthesia Plan Comments:         Anesthesia Quick Evaluation

## 2014-07-26 NOTE — Telephone Encounter (Signed)
On 08/27/12 LAL documented that the patient's daughter had called Endo and stated that patient would not be coming for his procedure and they didn't know when they could call back to [facility].  The daughter listed as his emergency contact is Kameryn Tisdel and she can be reached at [phone].

## 2014-07-27 ENCOUNTER — Encounter (HOSPITAL_COMMUNITY): Payer: Self-pay | Admitting: Internal Medicine

## 2014-07-27 NOTE — Progress Notes (Signed)
cc'ed to pcp °

## 2014-07-28 ENCOUNTER — Ambulatory Visit (INDEPENDENT_AMBULATORY_CARE_PROVIDER_SITE_OTHER): Payer: Medicare Other | Admitting: Psychiatry

## 2014-07-28 ENCOUNTER — Encounter (HOSPITAL_COMMUNITY): Payer: Self-pay | Admitting: Psychiatry

## 2014-07-28 ENCOUNTER — Other Ambulatory Visit (HOSPITAL_COMMUNITY): Payer: Self-pay | Admitting: Psychiatry

## 2014-07-28 VITALS — BP 168/92 | HR 64 | Ht 68.0 in | Wt 247.4 lb

## 2014-07-28 DIAGNOSIS — F332 Major depressive disorder, recurrent severe without psychotic features: Secondary | ICD-10-CM | POA: Diagnosis not present

## 2014-07-28 MED ORDER — TEMAZEPAM 15 MG PO CAPS
15.0000 mg | ORAL_CAPSULE | Freq: Every evening | ORAL | Status: DC | PRN
Start: 2014-07-28 — End: 2014-12-14

## 2014-07-28 MED ORDER — ESCITALOPRAM OXALATE 20 MG PO TABS: 20.0000 mg | ORAL_TABLET | Freq: Every day | ORAL | Status: DC

## 2014-07-28 NOTE — Telephone Encounter (Signed)
Pt has an appointment with [facility] on 07/29/2014 with Dr. Dalbert Batman @ 1145am
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
## 2014-07-29 ENCOUNTER — Other Ambulatory Visit (INDEPENDENT_AMBULATORY_CARE_PROVIDER_SITE_OTHER): Payer: Self-pay | Admitting: General Surgery

## 2014-08-11 ENCOUNTER — Encounter: Payer: Self-pay | Admitting: Gastroenterology

## 2014-08-12 ENCOUNTER — Telehealth: Payer: Self-pay

## 2014-08-12 NOTE — Telephone Encounter (Signed)
Received from [facility] surgery request for cardiac clearance.has appt next week 08/17/14 with Dr.McDowell. Records filed at [place]

## 2014-08-13 ENCOUNTER — Telehealth (HOSPITAL_COMMUNITY): Payer: Self-pay | Admitting: *Deleted

## 2014-08-17 ENCOUNTER — Ambulatory Visit (INDEPENDENT_AMBULATORY_CARE_PROVIDER_SITE_OTHER): Payer: Medicare Other | Admitting: Cardiology

## 2014-08-17 ENCOUNTER — Encounter: Payer: Self-pay | Admitting: Cardiology

## 2014-08-17 VITALS — BP 118/86 | HR 89 | Ht 68.0 in | Wt 245.0 lb

## 2014-08-17 DIAGNOSIS — E782 Mixed hyperlipidemia: Secondary | ICD-10-CM

## 2014-08-17 DIAGNOSIS — Z01810 Encounter for preprocedural cardiovascular examination: Secondary | ICD-10-CM | POA: Insufficient documentation

## 2014-08-17 DIAGNOSIS — Z8673 Personal history of transient ischemic attack (TIA), and cerebral infarction without residual deficits: Secondary | ICD-10-CM | POA: Insufficient documentation

## 2014-08-17 DIAGNOSIS — I10 Essential (primary) hypertension: Secondary | ICD-10-CM

## 2014-08-17 DIAGNOSIS — Z9861 Coronary angioplasty status: Secondary | ICD-10-CM

## 2014-08-17 DIAGNOSIS — I2510 Atherosclerotic heart disease of native coronary artery without angina pectoris: Secondary | ICD-10-CM

## 2014-08-17 DIAGNOSIS — E119 Type 2 diabetes mellitus without complications: Secondary | ICD-10-CM | POA: Insufficient documentation

## 2014-08-17 DIAGNOSIS — E118 Type 2 diabetes mellitus with unspecified complications: Secondary | ICD-10-CM

## 2014-08-17 NOTE — Assessment & Plan Note (Signed)
On Glucophage, followed by Dr. Legrand Rams. Recent random glucose 148, hemoglobin A1c pending.

## 2014-08-17 NOTE — Patient Instructions (Addendum)
Your physician recommends that you schedule a follow-up appointment in: to be determined after test. We will call you with results    Your physician has requested that you have a lexiscan myoview. For further information please visit [email or web address]. Please follow instruction sheet, as given.      Your physician recommends that you continue on your current medications as directed. Please refer to the Current Medication list given to you today.    Thank you for choosing [facility] !        
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
## 2014-08-17 NOTE — Assessment & Plan Note (Signed)
On Plavix 

## 2014-08-17 NOTE — Assessment & Plan Note (Signed)
Blood pressure today is normal on Diovan HCT.

## 2014-08-17 NOTE — Assessment & Plan Note (Signed)
Patient being considered for elective laparoscopic-assisted extended right colectomy under general anesthesia with Dr. Dalbert Batman, diagnoses of colorectal cancer. He has a reported history of remote heart attack and CAD, although unfortunately no details are available for my review. It sounds like he may of undergone a balloon angioplasty 10-20 years ago, and at this point does not endorse any angina symptoms with basic ADLs. Describes his regular activity as limited. Recent ECG shows LVH with repolarization abnormalities, and he does otherwise have active cardiovascular risk factors including hypertension, type 2 diabetes mellitus, and previous stroke. I discussed the situation with the patient, and have recommended a Lexiscan Cardiolite for evaluation of ischemic burden as a means of further risk stratifying the patient. This will be scheduled, further recommendations to follow.

## 2014-08-17 NOTE — Assessment & Plan Note (Signed)
As noted above, details are not available, including possible previous balloon angioplasty.

## 2014-08-17 NOTE — Assessment & Plan Note (Signed)
On Zocor, followed by Dr. Legrand Rams. I do not have recent lipid information.

## 2014-08-26 ENCOUNTER — Encounter (HOSPITAL_COMMUNITY): Payer: Self-pay

## 2014-08-26 ENCOUNTER — Encounter (HOSPITAL_COMMUNITY)
Admission: RE | Admit: 2014-08-26 | Discharge: 2014-08-26 | Disposition: A | Discharge status: Home / Self Care | Payer: Medicare Other | Source: Ambulatory Visit | Attending: Cardiology | Admitting: Cardiology

## 2014-08-26 ENCOUNTER — Ambulatory Visit (HOSPITAL_COMMUNITY)
Admission: RE | Admit: 2014-08-26 | Discharge: 2014-08-26 | Disposition: A | Discharge status: Home / Self Care | Payer: Medicare Other | Source: Ambulatory Visit | Attending: Cardiology | Admitting: Cardiology

## 2014-08-26 DIAGNOSIS — I10 Essential (primary) hypertension: Secondary | ICD-10-CM | POA: Insufficient documentation

## 2014-08-26 DIAGNOSIS — I2510 Atherosclerotic heart disease of native coronary artery without angina pectoris: Secondary | ICD-10-CM | POA: Insufficient documentation

## 2014-08-26 DIAGNOSIS — E119 Type 2 diabetes mellitus without complications: Secondary | ICD-10-CM | POA: Insufficient documentation

## 2014-08-26 DIAGNOSIS — Z01810 Encounter for preprocedural cardiovascular examination: Secondary | ICD-10-CM | POA: Diagnosis not present

## 2014-08-26 DIAGNOSIS — Z01818 Encounter for other preprocedural examination: Secondary | ICD-10-CM | POA: Diagnosis not present

## 2014-08-26 MED ORDER — SODIUM CHLORIDE 0.9 % IJ SOLN
INTRAMUSCULAR | Status: AC
  Administered 2014-08-26: 10 mL via INTRAVENOUS
  Filled 2014-08-26: qty 3

## 2014-08-26 MED ORDER — SODIUM CHLORIDE 0.9 % IJ SOLN
10.0000 mL | INTRAMUSCULAR | Status: DC | PRN
  Administered 2014-08-26: 10 mL via INTRAVENOUS
  Filled 2014-08-26: qty 10

## 2014-08-26 MED ORDER — TECHNETIUM TC 99M SESTAMIBI - CARDIOLITE
30.0000 | Freq: Once | INTRAVENOUS | Status: AC | PRN
  Administered 2014-08-26: 30 via INTRAVENOUS

## 2014-08-26 MED ORDER — REGADENOSON 0.4 MG/5ML IV SOLN
0.4000 mg | Freq: Once | INTRAVENOUS | Status: AC | PRN
  Administered 2014-08-26: 0.4 mg via INTRAVENOUS

## 2014-08-26 MED ORDER — TECHNETIUM TC 99M SESTAMIBI GENERIC - CARDIOLITE
10.0000 | Freq: Once | INTRAVENOUS | Status: AC | PRN
Start: 2014-08-26 — End: 2014-08-26
  Administered 2014-08-26: 10 via INTRAVENOUS

## 2014-08-26 MED ORDER — REGADENOSON 0.4 MG/5ML IV SOLN
INTRAVENOUS | Status: AC
  Administered 2014-08-26: 0.4 mg via INTRAVENOUS
  Filled 2014-08-26: qty 5

## 2014-08-26 NOTE — Progress Notes (Signed)
Stress Lab Nurses Notes - Erek Kowal Biffle 08/26/2014 Reason for doing test: CAD and Surgical Clearance Type of test: Wille Glaser Nurse performing test: Gerrit Halls, RN Nuclear Medicine Tech: Dyanne Carrel Echo Tech: Not Applicable MD performing test: Myles Gip Family MD: Dalbert Batman Test explained and consent signed: Yes.   IV started: Saline lock flushed, No redness or edema and Saline lock started in radiology Symptoms: Nausea Treatment/Intervention: None Reason test stopped: protocol completed After recovery IV was: Discontinued via X-ray tech and No redness or edema Patient to return to Nuc. Med at : 11:45 Patient discharged: Home Patient's Condition upon discharge was: stable Comments: During test  BP 173/89 & HR. Recovery BP 144/83 & HR 65.  Symptoms resolved in recovery. Geanie Cooley T

## 2014-08-27 ENCOUNTER — Telehealth: Payer: Self-pay

## 2014-08-27 NOTE — Telephone Encounter (Signed)
Faxed to [facility] surgery phone [phone], fax [phone] Dr McDowell's clearance noted attached to Stress test
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
## 2014-09-03 ENCOUNTER — Encounter (HOSPITAL_COMMUNITY): Payer: Self-pay | Admitting: *Deleted

## 2014-09-03 NOTE — Progress Notes (Signed)
Cardiac clearance noted in EPIC by Dr. Domenic Polite. Spoke with pt's daughter, Alyse Low for pre-op call. She was able to verify allergies, meds, medical and surgical history. Pre-op instructions given to her and she voiced understanding.

## 2014-09-04 ENCOUNTER — Telehealth (INDEPENDENT_AMBULATORY_CARE_PROVIDER_SITE_OTHER): Payer: Self-pay | Admitting: General Surgery

## 2014-09-04 NOTE — Telephone Encounter (Signed)
Called stating they did not have bowel prep info any more.  Sent new copy of [facility] bowel prep to Pt's daughter at [email or web address] for surgery on Mon

## 2014-09-05 MED ORDER — CHLORHEXIDINE GLUCONATE 4 % EX LIQD
60.0000 mL | Freq: Once | CUTANEOUS | Status: DC
  Filled 2014-09-05: qty 60

## 2014-09-05 MED ORDER — ALVIMOPAN 12 MG PO CAPS
12.0000 mg | ORAL_CAPSULE | Freq: Once | ORAL | Status: AC
  Administered 2014-09-06: 12 mg via ORAL
  Filled 2014-09-05: qty 1

## 2014-09-05 MED ORDER — DEXTROSE 5 % IV SOLN
2.0000 g | INTRAVENOUS | Status: DC
  Filled 2014-09-05: qty 2

## 2014-09-06 ENCOUNTER — Inpatient Hospital Stay (HOSPITAL_COMMUNITY)
Admission: RE | Admit: 2014-09-06 | Discharge: 2014-09-11 | DRG: 330 | Disposition: A | Discharge status: Home / Self Care | Payer: Medicare Other | Source: Ambulatory Visit | Attending: General Surgery | Admitting: General Surgery

## 2014-09-06 ENCOUNTER — Encounter (HOSPITAL_COMMUNITY)
Admission: RE | Disposition: A | Discharge status: Home / Self Care | Payer: Self-pay | Source: Ambulatory Visit | Attending: General Surgery

## 2014-09-06 ENCOUNTER — Inpatient Hospital Stay (HOSPITAL_COMMUNITY): Payer: Medicare Other

## 2014-09-06 ENCOUNTER — Inpatient Hospital Stay (HOSPITAL_COMMUNITY): Payer: Medicare Other | Admitting: Anesthesiology

## 2014-09-06 ENCOUNTER — Encounter (HOSPITAL_COMMUNITY): Payer: Self-pay | Admitting: Anesthesiology

## 2014-09-06 DIAGNOSIS — I10 Essential (primary) hypertension: Secondary | ICD-10-CM | POA: Diagnosis present

## 2014-09-06 DIAGNOSIS — Z6837 Body mass index (BMI) 37.0-37.9, adult: Secondary | ICD-10-CM | POA: Diagnosis not present

## 2014-09-06 DIAGNOSIS — Z7902 Long term (current) use of antithrombotics/antiplatelets: Secondary | ICD-10-CM | POA: Diagnosis not present

## 2014-09-06 DIAGNOSIS — K660 Peritoneal adhesions (postprocedural) (postinfection): Secondary | ICD-10-CM | POA: Diagnosis present

## 2014-09-06 DIAGNOSIS — M1990 Unspecified osteoarthritis, unspecified site: Secondary | ICD-10-CM | POA: Diagnosis present

## 2014-09-06 DIAGNOSIS — E780 Pure hypercholesterolemia: Secondary | ICD-10-CM | POA: Diagnosis present

## 2014-09-06 DIAGNOSIS — I2510 Atherosclerotic heart disease of native coronary artery without angina pectoris: Secondary | ICD-10-CM | POA: Diagnosis present

## 2014-09-06 DIAGNOSIS — I69351 Hemiplegia and hemiparesis following cerebral infarction affecting right dominant side: Secondary | ICD-10-CM

## 2014-09-06 DIAGNOSIS — E039 Hypothyroidism, unspecified: Secondary | ICD-10-CM | POA: Diagnosis present

## 2014-09-06 DIAGNOSIS — G4730 Sleep apnea, unspecified: Secondary | ICD-10-CM | POA: Diagnosis present

## 2014-09-06 DIAGNOSIS — Z794 Long term (current) use of insulin: Secondary | ICD-10-CM

## 2014-09-06 DIAGNOSIS — F17220 Nicotine dependence, chewing tobacco, uncomplicated: Secondary | ICD-10-CM | POA: Diagnosis present

## 2014-09-06 DIAGNOSIS — C19 Malignant neoplasm of rectosigmoid junction: Principal | ICD-10-CM | POA: Diagnosis present

## 2014-09-06 DIAGNOSIS — K219 Gastro-esophageal reflux disease without esophagitis: Secondary | ICD-10-CM | POA: Diagnosis present

## 2014-09-06 DIAGNOSIS — Z23 Encounter for immunization: Secondary | ICD-10-CM | POA: Diagnosis not present

## 2014-09-06 DIAGNOSIS — Z79891 Long term (current) use of opiate analgesic: Secondary | ICD-10-CM | POA: Diagnosis not present

## 2014-09-06 DIAGNOSIS — E669 Obesity, unspecified: Secondary | ICD-10-CM | POA: Diagnosis present

## 2014-09-06 DIAGNOSIS — I252 Old myocardial infarction: Secondary | ICD-10-CM | POA: Diagnosis not present

## 2014-09-06 DIAGNOSIS — E119 Type 2 diabetes mellitus without complications: Secondary | ICD-10-CM | POA: Diagnosis present

## 2014-09-06 DIAGNOSIS — F332 Major depressive disorder, recurrent severe without psychotic features: Secondary | ICD-10-CM | POA: Diagnosis present

## 2014-09-06 DIAGNOSIS — C182 Malignant neoplasm of ascending colon: Secondary | ICD-10-CM

## 2014-09-06 DIAGNOSIS — Z01818 Encounter for other preprocedural examination: Secondary | ICD-10-CM

## 2014-09-06 DIAGNOSIS — F419 Anxiety disorder, unspecified: Secondary | ICD-10-CM | POA: Diagnosis present

## 2014-09-06 HISTORY — DX: Anxiety disorder, unspecified: F41.9

## 2014-09-06 HISTORY — PX: COLON RESECTION: SHX5231

## 2014-09-06 HISTORY — DX: Bipolar disorder, unspecified: F31.9

## 2014-09-06 HISTORY — DX: Sleep apnea, unspecified: G47.30

## 2014-09-06 HISTORY — DX: Pneumonia, unspecified organism: J18.9

## 2014-09-06 LAB — GLUCOSE, CAPILLARY
GLUCOSE-CAPILLARY: 218 mg/dL — AB (ref 70–99)
Glucose-Capillary: 225 mg/dL — ABNORMAL HIGH (ref 70–99)
Glucose-Capillary: 201 mg/dL — ABNORMAL HIGH (ref 70–99)
Glucose-Capillary: 260 mg/dL — ABNORMAL HIGH (ref 70–99)
Glucose-Capillary: 309 mg/dL — ABNORMAL HIGH (ref 70–99)
Glucose-Capillary: 128 mg/dL — ABNORMAL HIGH (ref 70–99)

## 2014-09-06 LAB — COMPREHENSIVE METABOLIC PANEL
ALK PHOS: 60 U/L (ref 39–117)
ALT: 10 U/L (ref 0–53)
AST: 12 U/L (ref 0–37)
Albumin: 3.6 g/dL (ref 3.5–5.2)
Anion gap: 8 (ref 5–15)
BUN: 8 mg/dL (ref 6–23)
CO2: 22 mmol/L (ref 19–32)
CREATININE: 1.06 mg/dL (ref 0.50–1.35)
Calcium: 8.9 mg/dL (ref 8.4–10.5)
Chloride: 105 mmol/L (ref 96–112)
GFR calc Af Amer: 84 mL/min — ABNORMAL LOW (ref >90)
GFR calc non Af Amer: 73 mL/min — ABNORMAL LOW (ref >90)
Glucose, Bld: 236 mg/dL — ABNORMAL HIGH (ref 70–99)
POTASSIUM: 3.5 mmol/L (ref 3.5–5.1)
Sodium: 135 mmol/L (ref 135–145)
TOTAL PROTEIN: 7.4 g/dL (ref 6.0–8.3)
Total Bilirubin: 0.7 mg/dL (ref 0.3–1.2)

## 2014-09-06 LAB — BASIC METABOLIC PANEL
Anion gap: 8 (ref 5–15)
BUN: 10 mg/dL (ref 6–23)
CALCIUM: 8.1 mg/dL — AB (ref 8.4–10.5)
CHLORIDE: 105 mmol/L (ref 96–112)
CO2: 23 mmol/L (ref 19–32)
Creatinine, Ser: 1.12 mg/dL (ref 0.50–1.35)
GFR calc non Af Amer: 68 mL/min — ABNORMAL LOW (ref >90)
GFR, EST AFRICAN AMERICAN: 79 mL/min — AB (ref >90)
GLUCOSE: 287 mg/dL — AB (ref 70–99)
POTASSIUM: 3.8 mmol/L (ref 3.5–5.1)
SODIUM: 136 mmol/L (ref 135–145)

## 2014-09-06 LAB — CBC WITH DIFFERENTIAL/PLATELET
Basophils Absolute: 0 10*3/uL (ref 0.0–0.1)
Basophils Relative: 0 % (ref 0–1)
EOS ABS: 0.1 10*3/uL (ref 0.0–0.7)
EOS PCT: 1 % (ref 0–5)
HCT: 37.1 % — ABNORMAL LOW (ref 39.0–52.0)
Hemoglobin: 13.2 g/dL (ref 13.0–17.0)
Lymphocytes Relative: 21 % (ref 12–46)
Lymphs Abs: 1.9 10*3/uL (ref 0.7–4.0)
MCH: 25.6 pg — AB (ref 26.0–34.0)
MCHC: 35.6 g/dL (ref 30.0–36.0)
MCV: 72 fL — ABNORMAL LOW (ref 78.0–100.0)
MONO ABS: 0.5 10*3/uL (ref 0.1–1.0)
Monocytes Relative: 6 % (ref 3–12)
NEUTROS ABS: 6.6 10*3/uL (ref 1.7–7.7)
Neutrophils Relative %: 73 % (ref 43–77)
Platelets: 337 10*3/uL (ref 150–400)
RBC: 5.15 MIL/uL (ref 4.22–5.81)
RDW: 15.4 % (ref 11.5–15.5)
WBC: 9 10*3/uL (ref 4.0–10.5)

## 2014-09-06 LAB — CBC
HEMATOCRIT: 32.8 % — AB (ref 39.0–52.0)
Hemoglobin: 11.4 g/dL — ABNORMAL LOW (ref 13.0–17.0)
MCH: 24.7 pg — AB (ref 26.0–34.0)
MCHC: 34.8 g/dL (ref 30.0–36.0)
MCV: 71.1 fL — AB (ref 78.0–100.0)
PLATELETS: 320 10*3/uL (ref 150–400)
RBC: 4.61 MIL/uL (ref 4.22–5.81)
RDW: 15.2 % (ref 11.5–15.5)
WBC: 9.3 10*3/uL (ref 4.0–10.5)

## 2014-09-06 LAB — PROTIME-INR
INR: 1.12 (ref 0.00–1.49)
PROTHROMBIN TIME: 14.5 s (ref 11.6–15.2)

## 2014-09-06 LAB — APTT: APTT: 30 s (ref 24–37)

## 2014-09-06 LAB — TYPE AND SCREEN
ABO/RH(D): O POS
ANTIBODY SCREEN: NEGATIVE

## 2014-09-06 LAB — ABO/RH: ABO/RH(D): O POS

## 2014-09-06 SURGERY — LAPAROSCOPIC RIGHT COLON RESECTION
Anesthesia: General | Site: Abdomen | Laterality: Right

## 2014-09-06 MED ORDER — INSULIN GLARGINE 100 UNIT/ML ~~LOC~~ SOLN
30.0000 [IU] | Freq: Every day | SUBCUTANEOUS | Status: DC
Start: 2014-09-06 — End: 2014-09-07
  Administered 2014-09-06: 30 [IU] via SUBCUTANEOUS
  Filled 2014-09-06: qty 0.3

## 2014-09-06 MED ORDER — ONDANSETRON HCL 4 MG/2ML IJ SOLN: 4.0000 mg | Freq: Four times a day (QID) | INTRAMUSCULAR | Status: DC | PRN

## 2014-09-06 MED ORDER — NEOSTIGMINE METHYLSULFATE 10 MG/10ML IV SOLN
INTRAVENOUS | Status: AC
  Filled 2014-09-06: qty 1

## 2014-09-06 MED ORDER — IRBESARTAN 150 MG PO TABS
150.0000 mg | ORAL_TABLET | Freq: Every day | ORAL | Status: DC
  Administered 2014-09-07 – 2014-09-10 (×4): 150 mg via ORAL
  Filled 2014-09-06 (×5): qty 1

## 2014-09-06 MED ORDER — 0.9 % SODIUM CHLORIDE (POUR BTL) OPTIME
TOPICAL | Status: DC | PRN
  Administered 2014-09-06 (×2): 1000 mL

## 2014-09-06 MED ORDER — OXYCODONE-ACETAMINOPHEN 5-325 MG PO TABS
1.0000 | ORAL_TABLET | ORAL | Status: DC | PRN
  Administered 2014-09-10 – 2014-09-11 (×3): 2 via ORAL
  Filled 2014-09-06 (×3): qty 2

## 2014-09-06 MED ORDER — ARTIFICIAL TEARS OP OINT
TOPICAL_OINTMENT | OPHTHALMIC | Status: DC | PRN
  Administered 2014-09-06: 1 via OPHTHALMIC

## 2014-09-06 MED ORDER — LINAGLIPTIN 5 MG PO TABS
5.0000 mg | ORAL_TABLET | Freq: Every day | ORAL | Status: DC
  Filled 2014-09-06: qty 1

## 2014-09-06 MED ORDER — METOPROLOL TARTRATE 1 MG/ML IV SOLN
INTRAVENOUS | Status: AC
  Administered 2014-09-06: 16:00:00
  Filled 2014-09-06: qty 5

## 2014-09-06 MED ORDER — HYDROCHLOROTHIAZIDE 12.5 MG PO CAPS
12.5000 mg | ORAL_CAPSULE | Freq: Every day | ORAL | Status: DC
  Administered 2014-09-07 – 2014-09-10 (×4): 12.5 mg via ORAL
  Filled 2014-09-06 (×5): qty 1

## 2014-09-06 MED ORDER — ACETAMINOPHEN 325 MG PO TABS: 325.0000 mg | ORAL_TABLET | ORAL | Status: DC | PRN

## 2014-09-06 MED ORDER — BUPIVACAINE-EPINEPHRINE 0.25% -1:200000 IJ SOLN
INTRAMUSCULAR | Status: DC | PRN
  Administered 2014-09-06: 16 mL

## 2014-09-06 MED ORDER — ROCURONIUM BROMIDE 100 MG/10ML IV SOLN
INTRAVENOUS | Status: DC | PRN
  Administered 2014-09-06 (×2): 10 mg via INTRAVENOUS
  Administered 2014-09-06: 50 mg via INTRAVENOUS
  Administered 2014-09-06: 20 mg via INTRAVENOUS
  Administered 2014-09-06: 10 mg via INTRAVENOUS

## 2014-09-06 MED ORDER — DEXTROSE 5 % IV SOLN
2.0000 g | Freq: Once | INTRAVENOUS | Status: AC
  Administered 2014-09-06: 2 g via INTRAVENOUS
  Filled 2014-09-06: qty 2

## 2014-09-06 MED ORDER — MIDAZOLAM HCL 2 MG/2ML IJ SOLN
INTRAMUSCULAR | Status: AC
  Filled 2014-09-06: qty 2

## 2014-09-06 MED ORDER — PANTOPRAZOLE SODIUM 40 MG PO TBEC
40.0000 mg | DELAYED_RELEASE_TABLET | Freq: Every day | ORAL | Status: DC
  Administered 2014-09-07 – 2014-09-10 (×4): 40 mg via ORAL
  Filled 2014-09-06 (×4): qty 1

## 2014-09-06 MED ORDER — NEOSTIGMINE METHYLSULFATE 10 MG/10ML IV SOLN
INTRAVENOUS | Status: DC | PRN
  Administered 2014-09-06: 5 mg via INTRAVENOUS

## 2014-09-06 MED ORDER — SIMVASTATIN 40 MG PO TABS
40.0000 mg | ORAL_TABLET | Freq: Every evening | ORAL | Status: DC
  Administered 2014-09-06 – 2014-09-10 (×5): 40 mg via ORAL
  Filled 2014-09-06 (×6): qty 1

## 2014-09-06 MED ORDER — METOPROLOL TARTRATE 1 MG/ML IV SOLN
2.5000 mg | Freq: Four times a day (QID) | INTRAVENOUS | Status: DC
  Administered 2014-09-06 – 2014-09-11 (×19): 2.5 mg via INTRAVENOUS
  Filled 2014-09-06 (×30): qty 5

## 2014-09-06 MED ORDER — POTASSIUM CHLORIDE IN NACL 20-0.9 MEQ/L-% IV SOLN
INTRAVENOUS | Status: DC
  Administered 2014-09-06 – 2014-09-07 (×3): via INTRAVENOUS
  Administered 2014-09-07: 100 mL/h via INTRAVENOUS
  Administered 2014-09-08 – 2014-09-11 (×5): via INTRAVENOUS
  Filled 2014-09-06 (×11): qty 1000

## 2014-09-06 MED ORDER — LACTATED RINGERS IV SOLN
INTRAVENOUS | Status: DC | PRN
  Administered 2014-09-06 (×2): via INTRAVENOUS

## 2014-09-06 MED ORDER — HYDROMORPHONE HCL 1 MG/ML IJ SOLN
0.2500 mg | INTRAMUSCULAR | Status: DC | PRN
  Administered 2014-09-06 (×2): 0.5 mg via INTRAVENOUS

## 2014-09-06 MED ORDER — ONDANSETRON HCL 4 MG PO TABS: 8.0000 mg | ORAL_TABLET | Freq: Three times a day (TID) | ORAL | Status: DC | PRN

## 2014-09-06 MED ORDER — ESCITALOPRAM OXALATE 20 MG PO TABS
20.0000 mg | ORAL_TABLET | Freq: Every day | ORAL | Status: DC
  Administered 2014-09-07 – 2014-09-10 (×4): 20 mg via ORAL
  Filled 2014-09-06 (×2): qty 1
  Filled 2014-09-06: qty 2
  Filled 2014-09-06 (×2): qty 1

## 2014-09-06 MED ORDER — FENTANYL CITRATE 0.05 MG/ML IJ SOLN
INTRAMUSCULAR | Status: DC | PRN
  Administered 2014-09-06: 50 ug via INTRAVENOUS
  Administered 2014-09-06: 100 ug via INTRAVENOUS
  Administered 2014-09-06: 50 ug via INTRAVENOUS
  Administered 2014-09-06 (×2): 100 ug via INTRAVENOUS
  Administered 2014-09-06 (×3): 50 ug via INTRAVENOUS
  Administered 2014-09-06 (×2): 100 ug via INTRAVENOUS

## 2014-09-06 MED ORDER — HYDROMORPHONE 0.3 MG/ML IV SOLN
INTRAVENOUS | Status: AC
  Filled 2014-09-06: qty 25

## 2014-09-06 MED ORDER — LACTATED RINGERS IV SOLN
INTRAVENOUS | Status: DC
  Administered 2014-09-06: 50 mL/h via INTRAVENOUS

## 2014-09-06 MED ORDER — OXYCODONE HCL 5 MG/5ML PO SOLN: 5.0000 mg | Freq: Once | ORAL | Status: DC | PRN

## 2014-09-06 MED ORDER — METFORMIN HCL 500 MG PO TABS
1000.0000 mg | ORAL_TABLET | Freq: Two times a day (BID) | ORAL | Status: DC
  Administered 2014-09-06 – 2014-09-11 (×10): 1000 mg via ORAL
  Filled 2014-09-06 (×11): qty 2

## 2014-09-06 MED ORDER — INSULIN ASPART 100 UNIT/ML ~~LOC~~ SOLN
0.0000 [IU] | SUBCUTANEOUS | Status: DC
  Administered 2014-09-06: 7 [IU] via SUBCUTANEOUS
  Administered 2014-09-06: 15 [IU] via SUBCUTANEOUS
  Administered 2014-09-07: 2 [IU] via SUBCUTANEOUS

## 2014-09-06 MED ORDER — ACETAMINOPHEN 160 MG/5ML PO SOLN
325.0000 mg | ORAL | Status: DC | PRN
  Filled 2014-09-06: qty 20.3

## 2014-09-06 MED ORDER — BUPIVACAINE-EPINEPHRINE (PF) 0.25% -1:200000 IJ SOLN
INTRAMUSCULAR | Status: AC
  Filled 2014-09-06: qty 30

## 2014-09-06 MED ORDER — VALSARTAN-HYDROCHLOROTHIAZIDE 160-12.5 MG PO TABS: 1.0000 | ORAL_TABLET | Freq: Every day | ORAL | Status: DC

## 2014-09-06 MED ORDER — PROPOFOL 10 MG/ML IV BOLUS
INTRAVENOUS | Status: DC | PRN
  Administered 2014-09-06: 200 mg via INTRAVENOUS

## 2014-09-06 MED ORDER — INFLUENZA VAC SPLIT QUAD 0.5 ML IM SUSY: 0.5000 mL | PREFILLED_SYRINGE | INTRAMUSCULAR | Status: DC | PRN

## 2014-09-06 MED ORDER — LIDOCAINE HCL (CARDIAC) 20 MG/ML IV SOLN
INTRAVENOUS | Status: AC
  Filled 2014-09-06: qty 5

## 2014-09-06 MED ORDER — LABETALOL HCL 5 MG/ML IV SOLN
INTRAVENOUS | Status: DC | PRN
  Administered 2014-09-06 (×2): 20 mg via INTRAVENOUS

## 2014-09-06 MED ORDER — GLYCOPYRROLATE 0.2 MG/ML IJ SOLN
INTRAMUSCULAR | Status: DC | PRN
  Administered 2014-09-06: 0.6 mg via INTRAVENOUS

## 2014-09-06 MED ORDER — ONDANSETRON HCL 4 MG PO TABS: 4.0000 mg | ORAL_TABLET | Freq: Four times a day (QID) | ORAL | Status: DC | PRN

## 2014-09-06 MED ORDER — ONDANSETRON HCL 4 MG/2ML IJ SOLN
INTRAMUSCULAR | Status: DC | PRN
  Administered 2014-09-06: 4 mg via INTRAVENOUS

## 2014-09-06 MED ORDER — ALVIMOPAN 12 MG PO CAPS
12.0000 mg | ORAL_CAPSULE | Freq: Two times a day (BID) | ORAL | Status: DC
  Administered 2014-09-07 – 2014-09-09 (×6): 12 mg via ORAL
  Filled 2014-09-06 (×7): qty 1

## 2014-09-06 MED ORDER — HYDROMORPHONE HCL 1 MG/ML IJ SOLN
INTRAMUSCULAR | Status: AC
  Filled 2014-09-06: qty 1

## 2014-09-06 MED ORDER — PHENYLEPHRINE 40 MCG/ML (10ML) SYRINGE FOR IV PUSH (FOR BLOOD PRESSURE SUPPORT)
PREFILLED_SYRINGE | INTRAVENOUS | Status: AC
  Filled 2014-09-06: qty 10

## 2014-09-06 MED ORDER — OXYCODONE HCL 5 MG PO TABS: 5.0000 mg | ORAL_TABLET | Freq: Once | ORAL | Status: DC | PRN

## 2014-09-06 MED ORDER — MIDAZOLAM HCL 5 MG/5ML IJ SOLN
INTRAMUSCULAR | Status: DC | PRN
  Administered 2014-09-06: 2 mg via INTRAVENOUS

## 2014-09-06 MED ORDER — FENTANYL CITRATE 0.05 MG/ML IJ SOLN
INTRAMUSCULAR | Status: AC
  Filled 2014-09-06: qty 5

## 2014-09-06 MED ORDER — DEXTROSE 5 % IV SOLN
10.0000 mg | INTRAVENOUS | Status: DC | PRN
  Administered 2014-09-06: 40 ug/min via INTRAVENOUS

## 2014-09-06 MED ORDER — ONDANSETRON HCL 4 MG/2ML IJ SOLN
INTRAMUSCULAR | Status: AC
  Filled 2014-09-06: qty 2

## 2014-09-06 MED ORDER — PNEUMOCOCCAL VAC POLYVALENT 25 MCG/0.5ML IJ INJ: 0.5000 mL | INJECTION | INTRAMUSCULAR | Status: DC | PRN

## 2014-09-06 MED ORDER — LIDOCAINE HCL (CARDIAC) 20 MG/ML IV SOLN
INTRAVENOUS | Status: DC | PRN
  Administered 2014-09-06: 50 mg via INTRAVENOUS

## 2014-09-06 MED ORDER — PROPOFOL 10 MG/ML IV BOLUS
INTRAVENOUS | Status: AC
  Filled 2014-09-06: qty 20

## 2014-09-06 MED ORDER — ROCURONIUM BROMIDE 50 MG/5ML IV SOLN
INTRAVENOUS | Status: AC
  Filled 2014-09-06: qty 2

## 2014-09-06 MED ORDER — CEFOTETAN DISODIUM 2 G IJ SOLR
2.0000 g | Freq: Two times a day (BID) | INTRAMUSCULAR | Status: DC
  Filled 2014-09-06 (×2): qty 2

## 2014-09-06 MED ORDER — GLYCOPYRROLATE 0.2 MG/ML IJ SOLN
INTRAMUSCULAR | Status: AC
  Filled 2014-09-06: qty 1

## 2014-09-06 MED ORDER — ENOXAPARIN SODIUM 40 MG/0.4ML ~~LOC~~ SOLN
40.0000 mg | SUBCUTANEOUS | Status: DC
  Administered 2014-09-07 – 2014-09-11 (×5): 40 mg via SUBCUTANEOUS
  Filled 2014-09-06 (×6): qty 0.4

## 2014-09-06 MED ORDER — SODIUM CHLORIDE 0.9 % IR SOLN
Status: DC | PRN
  Administered 2014-09-06: 1000 mL

## 2014-09-06 MED ORDER — SUCCINYLCHOLINE CHLORIDE 20 MG/ML IJ SOLN
INTRAMUSCULAR | Status: DC | PRN
  Administered 2014-09-06: 120 mg via INTRAVENOUS

## 2014-09-06 MED ORDER — CEFOXITIN SODIUM 2 G IV SOLR
2.0000 g | INTRAVENOUS | Status: AC
  Administered 2014-09-06: 2 g via INTRAVENOUS
  Filled 2014-09-06: qty 2

## 2014-09-06 MED ORDER — GLYCOPYRROLATE 0.2 MG/ML IJ SOLN
INTRAMUSCULAR | Status: AC
  Filled 2014-09-06: qty 2

## 2014-09-06 MED ORDER — HYDROMORPHONE 0.3 MG/ML IV SOLN
INTRAVENOUS | Status: DC
  Administered 2014-09-06: 3 mg via INTRAVENOUS
  Administered 2014-09-06 – 2014-09-07 (×2): via INTRAVENOUS
  Administered 2014-09-07: 3 mg via INTRAVENOUS
  Administered 2014-09-07: 17:00:00 via INTRAVENOUS
  Administered 2014-09-07: 3.3 mg via INTRAVENOUS
  Administered 2014-09-07: 2.1 mg via INTRAVENOUS
  Administered 2014-09-07: 3 mg via INTRAVENOUS
  Administered 2014-09-07: 1.5 mg via INTRAVENOUS
  Administered 2014-09-08: 0.54 mg via INTRAVENOUS
  Administered 2014-09-08: 1.49 mg via INTRAVENOUS
  Administered 2014-09-08: 1.5 mg via INTRAVENOUS
  Administered 2014-09-08: 11:00:00 via INTRAVENOUS
  Administered 2014-09-08 – 2014-09-09 (×2): 0.9 mg via INTRAVENOUS
  Administered 2014-09-09: 1.49 mg via INTRAVENOUS
  Administered 2014-09-09: 1 mg via INTRAVENOUS
  Administered 2014-09-09: 0.3 mg via INTRAVENOUS
  Administered 2014-09-09: 1.5 mg via INTRAVENOUS
  Administered 2014-09-09: 1 mg via INTRAVENOUS
  Administered 2014-09-09: 0.499 mg via INTRAVENOUS
  Administered 2014-09-10: 1.8 mg via INTRAVENOUS
  Administered 2014-09-10: 1.2 mg via INTRAVENOUS
  Filled 2014-09-06 (×4): qty 25

## 2014-09-06 MED ORDER — TEMAZEPAM 15 MG PO CAPS
15.0000 mg | ORAL_CAPSULE | Freq: Every evening | ORAL | Status: DC | PRN
Start: 2014-09-06 — End: 2014-09-11

## 2014-09-06 MED ORDER — HYDROMORPHONE HCL 1 MG/ML IJ SOLN: 0.2500 mg | INTRAMUSCULAR | Status: DC | PRN

## 2014-09-06 SURGICAL SUPPLY — 72 items
APPLIER CLIP ROT 10 11.4 M/L (STAPLE)
APR CLP MED LRG 11.4X10 (STAPLE)
BLADE SURG ROTATE 9660 (MISCELLANEOUS) ×2 IMPLANT
CANISTER SUCTION 2500CC (MISCELLANEOUS) ×5 IMPLANT
CELLS DAT CNTRL 66122 CELL SVR (MISCELLANEOUS) ×1 IMPLANT
CHLORAPREP W/TINT 26ML (MISCELLANEOUS) ×3 IMPLANT
CLIP APPLIE ROT 10 11.4 M/L (STAPLE) IMPLANT
COVER MAYO STAND STRL (DRAPES) ×3 IMPLANT
COVER SURGICAL LIGHT HANDLE (MISCELLANEOUS) ×6 IMPLANT
DRAPE LAPAROSCOPIC ABDOMINAL (DRAPES) ×3 IMPLANT
DRAPE PROXIMA HALF (DRAPES) ×5 IMPLANT
DRAPE UTILITY XL STRL (DRAPES) ×13 IMPLANT
DRAPE WARM FLUID 44X44 (DRAPE) ×3 IMPLANT
DRSG OPSITE POSTOP 4X10 (GAUZE/BANDAGES/DRESSINGS) ×2 IMPLANT
DRSG OPSITE POSTOP 4X8 (GAUZE/BANDAGES/DRESSINGS) IMPLANT
ELECT BLADE 6.5 EXT (BLADE) IMPLANT
ELECT CAUTERY BLADE 6.4 (BLADE) ×6 IMPLANT
ELECT REM PT RETURN 9FT ADLT (ELECTROSURGICAL) ×3
ELECTRODE REM PT RTRN 9FT ADLT (ELECTROSURGICAL) ×1 IMPLANT
GAUZE SPONGE 2X2 8PLY STRL LF (GAUZE/BANDAGES/DRESSINGS) IMPLANT
GEL ULTRASOUND 20GR AQUASONIC (MISCELLANEOUS) IMPLANT
GLOVE BIO SURGEON STRL SZ 6.5 (GLOVE) ×2 IMPLANT
GLOVE BIO SURGEONS STRL SZ 6.5 (GLOVE) ×2
GLOVE BIOGEL M 7.0 STRL (GLOVE) ×4 IMPLANT
GLOVE BIOGEL M STRL SZ7.5 (GLOVE) ×2 IMPLANT
GLOVE BIOGEL PI IND STRL 6.5 (GLOVE) IMPLANT
GLOVE BIOGEL PI IND STRL 7.0 (GLOVE) IMPLANT
GLOVE BIOGEL PI IND STRL 8 (GLOVE) IMPLANT
GLOVE BIOGEL PI INDICATOR 6.5 (GLOVE) ×2
GLOVE BIOGEL PI INDICATOR 7.0 (GLOVE) ×2
GLOVE BIOGEL PI INDICATOR 8 (GLOVE) ×2
GLOVE EUDERMIC 7 POWDERFREE (GLOVE) ×6 IMPLANT
GLOVE SURG SS PI 6.5 STRL IVOR (GLOVE) ×2 IMPLANT
GOWN STRL REUS W/ TWL LRG LVL3 (GOWN DISPOSABLE) ×6 IMPLANT
GOWN STRL REUS W/ TWL XL LVL3 (GOWN DISPOSABLE) ×2 IMPLANT
GOWN STRL REUS W/TWL LRG LVL3 (GOWN DISPOSABLE) ×21
GOWN STRL REUS W/TWL XL LVL3 (GOWN DISPOSABLE) ×12
KIT BASIN OR (CUSTOM PROCEDURE TRAY) ×3 IMPLANT
LIGASURE IMPACT 36 18CM CVD LR (INSTRUMENTS) ×2 IMPLANT
NS IRRIG 1000ML POUR BTL (IV SOLUTION) ×6 IMPLANT
PAD ARMBOARD 7.5X6 YLW CONV (MISCELLANEOUS) ×6 IMPLANT
PENCIL BUTTON HOLSTER BLD 10FT (ELECTRODE) ×6 IMPLANT
RELOAD PROXIMATE 75MM BLUE (ENDOMECHANICALS) ×6 IMPLANT
RELOAD STAPLE 75 3.8 BLU REG (ENDOMECHANICALS) IMPLANT
RETRACTOR WND ALEXIS 18 MED (MISCELLANEOUS) IMPLANT
RTRCTR WOUND ALEXIS 18CM MED (MISCELLANEOUS) ×3
SCALPEL HARMONIC ACE (MISCELLANEOUS) ×3 IMPLANT
SCISSORS LAP 5X35 DISP (ENDOMECHANICALS) ×2 IMPLANT
SET IRRIG TUBING LAPAROSCOPIC (IRRIGATION / IRRIGATOR) ×2 IMPLANT
SLEEVE ENDOPATH XCEL 5M (ENDOMECHANICALS) ×8 IMPLANT
SPONGE GAUZE 2X2 STER 10/PKG (GAUZE/BANDAGES/DRESSINGS) ×2
SPONGE LAP 18X18 X RAY DECT (DISPOSABLE) ×4 IMPLANT
STAPLER GUN LINEAR PROX 60 (STAPLE) ×2 IMPLANT
STAPLER PROXIMATE 75MM BLUE (STAPLE) ×2 IMPLANT
STAPLER VISISTAT 35W (STAPLE) ×3 IMPLANT
SUCTION POOLE TIP (SUCTIONS) ×3 IMPLANT
SUT PDS AB 1 TP1 96 (SUTURE) ×6 IMPLANT
SUT SILK 2 0 SH CR/8 (SUTURE) ×3 IMPLANT
SUT SILK 2 0 TIES 10X30 (SUTURE) ×5 IMPLANT
SUT SILK 3 0 SH CR/8 (SUTURE) ×3 IMPLANT
SUT SILK 3 0 TIES 10X30 (SUTURE) ×3 IMPLANT
SYR BULB IRRIGATION 50ML (SYRINGE) ×6 IMPLANT
TOWEL OR 17X26 10 PK STRL BLUE (TOWEL DISPOSABLE) ×6 IMPLANT
TRAY FOLEY CATH 16FRSI W/METER (SET/KITS/TRAYS/PACK) ×3 IMPLANT
TRAY LAPAROSCOPIC (CUSTOM PROCEDURE TRAY) ×3 IMPLANT
TROCAR XCEL BLUNT TIP 100MML (ENDOMECHANICALS) ×2 IMPLANT
TROCAR XCEL NON-BLD 11X100MML (ENDOMECHANICALS) IMPLANT
TROCAR XCEL NON-BLD 5MMX100MML (ENDOMECHANICALS) ×3 IMPLANT
TUBE CONNECTING 12'X1/4 (SUCTIONS) ×2
TUBE CONNECTING 12X1/4 (SUCTIONS) ×4 IMPLANT
TUBING FILTER THERMOFLATOR (ELECTROSURGICAL) ×3 IMPLANT
YANKAUER SUCT BULB TIP NO VENT (SUCTIONS) ×6 IMPLANT

## 2014-09-06 NOTE — Interval H&P Note (Signed)
History and Physical Interval Note:  09/06/2014 10:54 AM  Derrick [name]  has presented today for surgery, with the diagnosis of colon cancer  The various methods of treatment have been discussed with the patient and family. After consideration of risks, benefits and other options for treatment, the patient has consented to  Procedure(s): LAPAROSCOPIC ASSISTED ASCENDING  COLON RESECTION (Right) as a surgical intervention .  The patient's history has been reviewed, pre op cardiac evaluation reviewed, patient examined today,   no change in status, stable for surgery. He states he took his bowel prep yesterday  and stopped Plavix on 09/01/2014.     I have reviewed the patient's chart and labs.  Questions were answered to the patient's satisfaction.     Adin Hector
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
## 2014-09-06 NOTE — Anesthesia Preprocedure Evaluation (Addendum)
Anesthesia Evaluation  Patient identified by MRN, date of birth, ID band Patient awake    Reviewed: Allergy & Precautions, NPO status , Patient's Chart, lab work & pertinent test results  History of Anesthesia Complications Negative for: history of anesthetic complications  Airway Mallampati: II  TM Distance: >3 FB     Dental  (+) Edentulous Upper, Poor Dentition   Pulmonary sleep apnea and Continuous Positive Airway Pressure Ventilation , former smoker,  Does not use CPAP breath sounds clear to auscultation  Pulmonary exam normal       Cardiovascular hypertension, Pt. on medications - angina+ CAD - CHF Rhythm:Regular Rate:Normal     Neuro/Psych  Headaches, Anxiety Depression Bipolar Disorder Rt. Leg weak.  Uses cane CVA, Residual Symptoms    GI/Hepatic Neg liver ROS, Bowel prep,Ascending colon tumor.  Abdominal pain.   Endo/Other  diabetes, Type 2, Insulin DependentHypothyroidism Morbid obesity  Renal/GU negative Renal ROS     Musculoskeletal  (+) Arthritis -,   Abdominal (+) + obese,  Abdomen: tender.    Peds  Hematology negative hematology ROS (+) Blood dyscrasia, anemia ,   Anesthesia Other Findings   Reproductive/Obstetrics                            Anesthesia Physical Anesthesia Plan  ASA: III  Anesthesia Plan: General   Post-op Pain Management:    Induction: Intravenous  Airway Management Planned: Oral ETT  Additional Equipment: None  Intra-op Plan:   Post-operative Plan: Extubation in OR  Informed Consent: I have reviewed the patients History and Physical, chart, labs and discussed the procedure including the risks, benefits and alternatives for the proposed anesthesia with the patient or authorized representative who has indicated his/her understanding and acceptance.   Dental advisory given  Plan Discussed with: CRNA, Anesthesiologist and Surgeon  Anesthesia  Plan Comments:        Anesthesia Quick Evaluation

## 2014-09-06 NOTE — Progress Notes (Signed)
eLink Physician-Brief Progress Note Patient Name: Derrick [name] DOB: 1951/05/17 MRN: [id number]   Date of Service  09/06/2014  HPI/Events of Note  S/p lap assisted colectomy CAD, DM2 on lantus, on plavix for CVA  eICU Interventions  Agree with giving 1/2 dose lantus Prefer to hold valsartan until renal function known to be stable SSI -    New ICU patient evaluation: This patient was evaluated by the [facility] team. I have reviewed relevant documentation including care plan & orders.   Intervention Category Evaluation Type: New Patient Evaluation  ALVA,RAKESH V. 09/06/2014, 4:48 PM

## 2014-09-06 NOTE — OR Nursing (Signed)
Frozen walked down and Dr. Dalbert Batman talked to pathologist at [phone].

## 2014-09-06 NOTE — Transfer of Care (Signed)
Immediate Anesthesia Transfer of Care Note  Patient: Derrick [name]  Procedure(s) Performed: Procedure(s): LAPAROSCOPIC ASSISTED ASCENDING  COLON RESECTION (Right)  Patient Location: PACU  Anesthesia Type:General  Level of Consciousness: awake, alert , sedated and patient cooperative  Airway & Oxygen Therapy: Patient Spontanous Breathing and Patient connected to face mask oxygen  Post-op Assessment: Report given to RN, Post -op Vital signs reviewed and stable and Patient moving all extremities  Post vital signs: Reviewed and stable  Last Vitals:  Filed Vitals:   09/06/14 0920  BP: 158/92  Pulse:   Temp:   Resp:     Complications: No apparent anesthesia complications

## 2014-09-06 NOTE — Anesthesia Postprocedure Evaluation (Signed)
  Anesthesia Post-op Note  Patient: Derrick [name]  Procedure(s) Performed: Procedure(s): LAPAROSCOPIC ASSISTED ASCENDING  COLON RESECTION (Right)  Patient Location: PACU  Anesthesia Type:General  Level of Consciousness: awake  Airway and Oxygen Therapy: Patient Spontanous Breathing  Post-op Pain: mild  Post-op Assessment: Post-op Vital signs reviewed, Patient's Cardiovascular Status Stable, Respiratory Function Stable, Patent Airway, No signs of Nausea or vomiting and Pain level controlled  Post-op Vital Signs: Reviewed and stable  Last Vitals:  Filed Vitals:   09/06/14 1530  BP: 171/101  Pulse: 62  Temp:   Resp: 18    Complications: No apparent anesthesia complications

## 2014-09-07 ENCOUNTER — Encounter (HOSPITAL_COMMUNITY): Payer: Self-pay | Admitting: General Surgery

## 2014-09-07 LAB — HEMOGLOBIN A1C
HEMOGLOBIN A1C: 8.5 % — AB (ref 4.8–5.6)
Mean Plasma Glucose: 197 mg/dL

## 2014-09-07 LAB — GLUCOSE, CAPILLARY
GLUCOSE-CAPILLARY: 96 mg/dL (ref 70–99)
GLUCOSE-CAPILLARY: 126 mg/dL — AB (ref 70–99)
Glucose-Capillary: 157 mg/dL — ABNORMAL HIGH (ref 70–99)
Glucose-Capillary: 136 mg/dL — ABNORMAL HIGH (ref 70–99)
Glucose-Capillary: 151 mg/dL — ABNORMAL HIGH (ref 70–99)

## 2014-09-07 LAB — CBC
HEMATOCRIT: 32.5 % — AB (ref 39.0–52.0)
HEMOGLOBIN: 11.2 g/dL — AB (ref 13.0–17.0)
MCH: 24.7 pg — ABNORMAL LOW (ref 26.0–34.0)
MCHC: 34.5 g/dL (ref 30.0–36.0)
MCV: 71.7 fL — AB (ref 78.0–100.0)
Platelets: 331 10*3/uL (ref 150–400)
RBC: 4.53 MIL/uL (ref 4.22–5.81)
RDW: 15.4 % (ref 11.5–15.5)
WBC: 10.2 10*3/uL (ref 4.0–10.5)

## 2014-09-07 LAB — BASIC METABOLIC PANEL
Anion gap: 6 (ref 5–15)
BUN: 10 mg/dL (ref 6–23)
CALCIUM: 8.4 mg/dL (ref 8.4–10.5)
CHLORIDE: 108 mmol/L (ref 96–112)
CO2: 27 mmol/L (ref 19–32)
Creatinine, Ser: 1.14 mg/dL (ref 0.50–1.35)
GFR calc Af Amer: 77 mL/min — ABNORMAL LOW (ref >90)
GFR calc non Af Amer: 67 mL/min — ABNORMAL LOW (ref >90)
GLUCOSE: 92 mg/dL (ref 70–99)
Potassium: 4.2 mmol/L (ref 3.5–5.1)
SODIUM: 141 mmol/L (ref 135–145)

## 2014-09-07 LAB — CEA: CEA: 6.4 ng/mL — ABNORMAL HIGH (ref 0.0–4.7)

## 2014-09-07 MED ORDER — INSULIN ASPART 100 UNIT/ML ~~LOC~~ SOLN
0.0000 [IU] | Freq: Three times a day (TID) | SUBCUTANEOUS | Status: DC
  Administered 2014-09-07: 4 [IU] via SUBCUTANEOUS
  Administered 2014-09-07 (×2): 3 [IU] via SUBCUTANEOUS
  Administered 2014-09-08: 4 [IU] via SUBCUTANEOUS
  Administered 2014-09-08 – 2014-09-09 (×4): 3 [IU] via SUBCUTANEOUS
  Administered 2014-09-10: 4 [IU] via SUBCUTANEOUS

## 2014-09-07 MED ORDER — INSULIN GLARGINE 100 UNIT/ML ~~LOC~~ SOLN
30.0000 [IU] | Freq: Every day | SUBCUTANEOUS | Status: DC
  Administered 2014-09-07 – 2014-09-10 (×4): 30 [IU] via SUBCUTANEOUS
  Filled 2014-09-07 (×5): qty 0.3

## 2014-09-07 NOTE — Evaluation (Signed)
Physical Therapy Evaluation Patient Details Name: Derrick [name] MRN: [id number] DOB: [birth year] Today's Date: 09/07/2014   History of Present Illness  Pt admitted with colon CA s/p resection  Clinical Impression  Pt moving well and has 12 children to help care for him. Pt normally independent and uses a cane at times. Pt will benefit from acute therapy to maximize mobility, gait and function to return pt to PLOF and decrease burden of care. Pt encouraged to be OOB for all meals and to walk several times a day with staff.     Follow Up Recommendations No PT follow up    Equipment Recommendations  None recommended by PT    Recommendations for Other Services       Precautions / Restrictions Precautions Precautions: None      Mobility  Bed Mobility Overal bed mobility: Needs Assistance Bed Mobility: Rolling;Sidelying to Sit;Sit to Sidelying Rolling: Supervision Sidelying to sit: Min assist     Sit to sidelying: Supervision General bed mobility comments: verbal and tactile cues with min assist to elevate trunk from surface  Transfers Overall transfer level: Needs assistance   Transfers: Sit to/from Stand Sit to Stand: Supervision         General transfer comment: cues for hand placement  Ambulation/Gait Ambulation/Gait assistance: Supervision Ambulation Distance (Feet): 300 Feet Assistive device: Rolling walker (2 wheeled) Gait Pattern/deviations: Step-through pattern;Decreased stride length   Gait velocity interpretation: Below normal speed for age/gender    Stairs            Wheelchair Mobility    Modified Rankin (Stroke Patients Only)       Balance                                             Pertinent Vitals/Pain Pain Assessment: No/denies pain    Home Living Family/patient expects to be discharged to:: Private residence Living Arrangements: Children Available Help at Discharge: Family;Available 24 hours/day Type of  Home: House Home Access: Stairs to enter   [employer] of Steps: 3 Home Layout: One level Home Equipment: Cane - single point      Prior Function Level of Independence: Independent with assistive device(s)         Comments: uses a cane and performs ADLs on his own. Retired Horticulturist, commercial        Extremity/Trunk Assessment   Upper Extremity Assessment: Overall WFL for tasks assessed           Lower Extremity Assessment: Overall WFL for tasks assessed      Cervical / Trunk Assessment: Normal  Communication   Communication: No difficulties  Cognition Arousal/Alertness: Awake/alert Behavior During Therapy: WFL for tasks assessed/performed Overall Cognitive Status: Within Functional Limits for tasks assessed                      General Comments      Exercises        Assessment/Plan    PT Assessment Patient needs continued PT services  PT Diagnosis Difficulty walking   PT Problem List Decreased activity tolerance;Decreased mobility;Decreased knowledge of use of DME  PT Treatment Interventions Gait training;Stair training;DME instruction;Functional mobility training;Therapeutic activities;Patient/family education   PT Goals (Current goals can be found in the Care Plan section) Acute Rehab PT Goals Patient Stated Goal: do nothing PT Goal  Formulation: With patient Time For Goal Achievement: 09/14/14 Potential to Achieve Goals: Good    Frequency Min 3X/week   Barriers to discharge        Co-evaluation               End of Session   Activity Tolerance: Patient tolerated treatment well Patient left: in bed;with call bell/phone within reach Nurse Communication: Mobility status         Time: 2947-6546 PT Time Calculation (min) (ACUTE ONLY): 19 min   Charges:   PT Evaluation $Initial PT Evaluation Tier I: 1 Procedure     PT G CodesMelford Aase 09/07/2014, 12:41 PM Elwyn Reach,  [place]
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
## 2014-09-07 NOTE — Progress Notes (Signed)
UR Completed.  [phone]  

## 2014-09-08 LAB — GLUCOSE, CAPILLARY
Glucose-Capillary: 164 mg/dL — ABNORMAL HIGH (ref 70–99)
Glucose-Capillary: 145 mg/dL — ABNORMAL HIGH (ref 70–99)
Glucose-Capillary: 149 mg/dL — ABNORMAL HIGH (ref 70–99)
Glucose-Capillary: 151 mg/dL — ABNORMAL HIGH (ref 70–99)

## 2014-09-08 LAB — BASIC METABOLIC PANEL
ANION GAP: 5 (ref 5–15)
BUN: 8 mg/dL (ref 6–23)
CHLORIDE: 106 mmol/L (ref 96–112)
CO2: 24 mmol/L (ref 19–32)
Calcium: 8.3 mg/dL — ABNORMAL LOW (ref 8.4–10.5)
Creatinine, Ser: 0.95 mg/dL (ref 0.50–1.35)
GFR calc Af Amer: >90 mL/min (ref >90)
GFR calc non Af Amer: 87 mL/min — ABNORMAL LOW (ref >90)
GLUCOSE: 157 mg/dL — AB (ref 70–99)
Potassium: 3.8 mmol/L (ref 3.5–5.1)
Sodium: 135 mmol/L (ref 135–145)

## 2014-09-08 LAB — CBC
HCT: 30 % — ABNORMAL LOW (ref 39.0–52.0)
Hemoglobin: 10.3 g/dL — ABNORMAL LOW (ref 13.0–17.0)
MCH: 24.8 pg — ABNORMAL LOW (ref 26.0–34.0)
MCHC: 34.3 g/dL (ref 30.0–36.0)
MCV: 72.1 fL — ABNORMAL LOW (ref 78.0–100.0)
Platelets: 267 10*3/uL (ref 150–400)
RBC: 4.16 MIL/uL — ABNORMAL LOW (ref 4.22–5.81)
RDW: 15.3 % (ref 11.5–15.5)
WBC: 10 10*3/uL (ref 4.0–10.5)

## 2014-09-08 LAB — HEMOGLOBIN A1C
HEMOGLOBIN A1C: 8.3 % — AB (ref 4.8–5.6)
MEAN PLASMA GLUCOSE: 192 mg/dL

## 2014-09-08 MED ORDER — IRBESARTAN 150 MG PO TABS: 150.0000 mg | ORAL_TABLET | Freq: Every day | ORAL | Status: DC

## 2014-09-08 NOTE — Progress Notes (Signed)
2 Days Post-Op  Subjective: Alert and stable. Did well with PT yesterday. Up in chair. Tolerating clear liquids without nausea. No flatus or stool yet.  Diuresing. Foley to come out this morning. CBGs range 96-157. Good control. Hemoglobin 10.3. WBC 10,000. Potassium 3.8. Creatinine 0.95.  Objective: Vital signs in last 24 hours: Temp:  [98 F (36.7 C)-99 F (37.2 C)] 98.8 F (37.1 C) (02/24 0400) Pulse Rate:  [69-95] 76 (02/24 0500) Resp:  [10-33] 17 (02/24 0500) BP: (123-170)/(64-90) 162/72 mmHg (02/24 0500) SpO2:  [92 %-97 %] 94 % (02/24 0500) Last BM Date: 09/05/14  Intake/Output from previous day: 02/23 0701 - 02/24 0700 In: 2450 [P.O.:250; I.V.:2200] Out: 2005 [Urine:2005] Intake/Output this shift: Total I/O In: 1000 [I.V.:1000] Out: 1100 [Urine:1100]   EXAM: General appearance: Alert. Cooperative. Flattened affect. Appropriate and oriented. Doesn't seem to be in any distress. Resp: clear to auscultation bilaterally GI: Abdomen soft. Rare bowel sounds. Wounds clean. Doesn't seem distended.  Lab Results:  Results for orders placed or performed during the hospital encounter of 09/06/14 (from the past 24 hour(s))  Glucose, capillary     Status: Abnormal   Collection Time: 09/07/14  7:45 AM  Result Value Ref Range   Glucose-Capillary 126 (H) 70 - 99 mg/dL   Comment 1 Capillary Specimen   Glucose, capillary     Status: Abnormal   Collection Time: 09/07/14 11:14 AM  Result Value Ref Range   Glucose-Capillary 157 (H) 70 - 99 mg/dL   Comment 1 Notify RN   Glucose, capillary     Status: Abnormal   Collection Time: 09/07/14  4:59 PM  Result Value Ref Range   Glucose-Capillary 136 (H) 70 - 99 mg/dL   Comment 1 Notify RN   Glucose, capillary     Status: Abnormal   Collection Time: 09/07/14  8:42 PM  Result Value Ref Range   Glucose-Capillary 151 (H) 70 - 99 mg/dL   Comment 1 Capillary Specimen   CBC     Status: Abnormal   Collection Time: 09/08/14  3:19 AM  Result  Value Ref Range   WBC 10.0 4.0 - 10.5 K/uL   RBC 4.16 (L) 4.22 - 5.81 MIL/uL   Hemoglobin 10.3 (L) 13.0 - 17.0 g/dL   HCT 30.0 (L) 39.0 - 52.0 %   MCV 72.1 (L) 78.0 - 100.0 fL   MCH 24.8 (L) 26.0 - 34.0 pg   MCHC 34.3 30.0 - 36.0 g/dL   RDW 15.3 11.5 - 15.5 %   Platelets 267 150 - 400 K/uL  Basic metabolic panel     Status: Abnormal   Collection Time: 09/08/14  3:19 AM  Result Value Ref Range   Sodium 135 135 - 145 mmol/L   Potassium 3.8 3.5 - 5.1 mmol/L   Chloride 106 96 - 112 mmol/L   CO2 24 19 - 32 mmol/L   Glucose, Bld 157 (H) 70 - 99 mg/dL   BUN 8 6 - 23 mg/dL   Creatinine, Ser 0.95 0.50 - 1.35 mg/dL   Calcium 8.3 (L) 8.4 - 10.5 mg/dL   GFR calc non Af Amer 87 (L) >90 mL/min   GFR calc Af Amer >90 >90 mL/min   Anion gap 5 5 - 15     Studies/Results: No results found.  [name] alvimopan  12 mg Oral BID  . enoxaparin (LOVENOX) injection  40 mg Subcutaneous Q24H  . escitalopram  20 mg Oral Daily  . irbesartan  150 mg Oral Daily   And  .  hydrochlorothiazide  12.5 mg Oral Daily  . HYDROmorphone PCA 0.3 mg/mL   Intravenous 6 times per day  . insulin aspart  0-20 Units Subcutaneous TID WC  . insulin glargine  30 Units Subcutaneous QHS  . metFORMIN  1,000 mg Oral BID WC  . metoprolol  2.5 mg Intravenous 4 times per day  . pantoprazole  40 mg Oral Daily  . simvastatin  40 mg Oral QPM     Assessment/Plan: s/p Procedure(s): LAPAROSCOPIC ASSISTED ASCENDING  COLON RESECTION  POD #2. Laparoscopic assisted extended right colectomy. Stable. Expected ileus. Clear liquid diet for now Entereg protocol Discontinue Foley Transfer 6N-med-surg Ambulate Cut IV back. Check pathology.  DVT prophylaxis. On Lovenox.  Insulin-dependent diabetes. Good control at present. Postop goal 120-180. Giving half his Lantus dose and sliding scale insulin and metformin. Continue to monitor. Hold tradjenta for now until diet established. Plan to slowly transition to outpatient regimen once diet  established.  History CVA on Plavix. Holding Plavix currently. Restart when stable and diet established. Continue Lovenox for now.  Coronary artery disease. History of myocardial infarction. Preop nuclear medicine study shows EF 60%, normal LV wall motion, small area inferior apical ischemia. Low risk. Saw Dr. Johnny Bridge preop. Continue prophylactic beta blockers  Major depressive disorder, recurrent, severe without psychotic features.  taking lexapro and restoril Stable without agitation. Followed by Dr. Levonne Spiller, psychiatrist  Hypertension, benign. Borderline elevation. Will add Avapro to HCTZ. Decrease IV to 50 mL/h.  @PROBHOSP @  LOS: 2 days    Derrick [name] M 09/08/2014  . .prob

## 2014-09-08 NOTE — Progress Notes (Signed)
Report called to [place]. Pt to transfer to 6N-13 via ambulation/wheelchair. VS stable at time of transfer. Meds in chart, family updated on room change. Bedside handoff to [place]. No questions or complaints at this time.  Derrick [name] L

## 2014-09-09 LAB — GLUCOSE, CAPILLARY
GLUCOSE-CAPILLARY: 73 mg/dL (ref 70–99)
Glucose-Capillary: 119 mg/dL — ABNORMAL HIGH (ref 70–99)
Glucose-Capillary: 144 mg/dL — ABNORMAL HIGH (ref 70–99)
Glucose-Capillary: 126 mg/dL — ABNORMAL HIGH (ref 70–99)

## 2014-09-09 MED ORDER — BISACODYL 10 MG RE SUPP
10.0000 mg | Freq: Two times a day (BID) | RECTAL | Status: AC
  Administered 2014-09-09 – 2014-09-10 (×3): 10 mg via RECTAL
  Filled 2014-09-09 (×3): qty 1

## 2014-09-09 NOTE — Progress Notes (Signed)
Physical Therapy Treatment Patient Details Name: Derrick [name] MRN: [id number] DOB: [birth year] Today's Date: 09-30-2014    History of Present Illness Pt admitted with colon CA s/p resection    PT Comments    Patient progressing well with mobility. Improved ambulation distance without use of RW for support. Mild balance deficits noted with head turns. Tolerated stair negotiation with supervision. Will continue to follow to improve higher level balance prior to return home.   Follow Up Recommendations  No PT follow up     Equipment Recommendations  None recommended by PT    Recommendations for Other Services       Precautions / Restrictions Precautions Precautions: None Restrictions Weight Bearing Restrictions: No    Mobility  Bed Mobility Overal bed mobility: Needs Assistance Bed Mobility: Rolling;Sidelying to Sit Rolling: Supervision Sidelying to sit: Supervision       General bed mobility comments: HOB flat, use of rails for support.   Transfers Overall transfer level: Needs assistance Equipment used: None Transfers: Sit to/from Stand Sit to Stand: Supervision         General transfer comment: Supervision for safety.  Ambulation/Gait Ambulation/Gait assistance: Supervision Ambulation Distance (Feet): 350 Feet Assistive device: Rolling walker (2 wheeled);None Gait Pattern/deviations: Step-through pattern;Decreased stride length;Staggering right   Gait velocity interpretation: Below normal speed for age/gender General Gait Details: Pt with slow, mildly unsteady gait when ambulating without RW esp noted with head turns but no overt LOB.    Stairs Stairs: Yes Stairs assistance: Supervision Stair Management: One rail Right;Alternating pattern Number of Stairs: 4    Wheelchair Mobility    Modified Rankin (Stroke Patients Only)       Balance Overall balance assessment: Needs assistance Sitting-balance support: Feet supported;No upper extremity  supported Sitting balance-Leahy Scale: Good     Standing balance support: During functional activity Standing balance-Leahy Scale: Fair                      Cognition Arousal/Alertness: Awake/alert Behavior During Therapy: WFL for tasks assessed/performed Overall Cognitive Status: Within Functional Limits for tasks assessed                      Exercises      General Comments General comments (skin integrity, edema, etc.): Pt concerned about having someone change his abdomina bandage. RN notified.       Pertinent Vitals/Pain Pain Assessment: No/denies pain    Home Living                      Prior Function            PT Goals (current goals can now be found in the care plan section) Progress towards PT goals: Progressing toward goals    Frequency  Min 3X/week    PT Plan Current plan remains appropriate    Co-evaluation             End of Session Equipment Utilized During Treatment: Gait belt Activity Tolerance: Patient tolerated treatment well Patient left: in chair;with call bell/phone within reach     Time: 4235-3614 PT Time Calculation (min) (ACUTE ONLY): 15 min  Charges:  $Gait Training: 8-22 mins                    G CodesCandy Sledge A 09/30/2014, 3:50 PM Candy Sledge, [place], DPT [phone]

## 2014-09-09 NOTE — Progress Notes (Signed)
3 Days Post-Op  Subjective: Transferred to 6 N. Stable and alert. Says he's ambulating. No stool or flatus yet. Tolerating clear liquids. Coughs occasionally, somewhat productive. Seems to be voiding okay. Afebrile. Heart rate 60. BP 163/82. Pathology pending.   Objective: Vital signs in last 24 hours: Temp:  [98 F (36.7 C)-99.7 F (37.6 C)] 98 F (36.7 C) (02/25 0553) Pulse Rate:  [60-90] 60 (02/25 0553) Resp:  [17-22] 18 (02/25 0553) BP: (151-171)/(79-89) 163/82 mmHg (02/25 0553) SpO2:  [94 %-100 %] 100 % (02/25 0553) Last BM Date: 09/05/14  Intake/Output from previous day: 02/24 0701 - 02/25 0700 In: 750 [I.V.:750] Out: 450 [Urine:450] Intake/Output this shift:     EXAM: General appearance: Alert. Cooperative. No distress. Mental status at baseline for him. Resp: Clear anteriorly, but a few rhonchi at bases. Clear with cough. Vital capacity 1200 mL on incentive spirometry GI: Soft. Not distended. Obese. Active bowel sounds. Wounds look clean.  Lab Results:  Results for orders placed or performed during the hospital encounter of 09/06/14 (from the past 24 hour(s))  Glucose, capillary     Status: Abnormal   Collection Time: 09/08/14  7:23 AM  Result Value Ref Range   Glucose-Capillary 164 (H) 70 - 99 mg/dL   Comment 1 Capillary Specimen    Comment 2 Notify RN   Glucose, capillary     Status: Abnormal   Collection Time: 09/08/14 11:08 AM  Result Value Ref Range   Glucose-Capillary 145 (H) 70 - 99 mg/dL   Comment 1 Capillary Specimen    Comment 2 Notify RN   Glucose, capillary     Status: Abnormal   Collection Time: 09/08/14  5:18 PM  Result Value Ref Range   Glucose-Capillary 149 (H) 70 - 99 mg/dL  Glucose, capillary     Status: Abnormal   Collection Time: 09/08/14 10:10 PM  Result Value Ref Range   Glucose-Capillary 151 (H) 70 - 99 mg/dL     Studies/Results: No results found.  [name] alvimopan  12 mg Oral BID  . bisacodyl  10 mg Rectal BID  . enoxaparin  (LOVENOX) injection  40 mg Subcutaneous Q24H  . escitalopram  20 mg Oral Daily  . irbesartan  150 mg Oral Daily   And  . hydrochlorothiazide  12.5 mg Oral Daily  . HYDROmorphone PCA 0.3 mg/mL   Intravenous 6 times per day  . insulin aspart  0-20 Units Subcutaneous TID WC  . insulin glargine  30 Units Subcutaneous QHS  . metFORMIN  1,000 mg Oral BID WC  . metoprolol  2.5 mg Intravenous 4 times per day  . pantoprazole  40 mg Oral Daily  . simvastatin  40 mg Oral QPM     Assessment/Plan: s/p Procedure(s): LAPAROSCOPIC ASSISTED ASCENDING  COLON RESECTION  POD #3. Laparoscopic assisted extended right colectomy. Stable. Expected ileus. Advance to full liquids  Dulcolax suppository twice a day Entereg protocol  Ambulate more. Incentive spirometry more. Cough and deep breathe more.  Check pathology.  DVT prophylaxis. On Lovenox.  Insulin-dependent diabetes. Good control at present. Postop goal 120-180. Giving half his Lantus dose and sliding scale insulin and metformin for now. Continue to monitor. Hold tradjenta for now until diet established. Plan to slowly transition to outpatient regimen once diet established.  History CVA on Plavix. Holding Plavix currently.  Restart tomorrow if labs okay and tolerating full liquids.   Coronary artery disease. History of myocardial infarction. Preop nuclear medicine study shows EF 60%, normal LV wall motion, small  area inferior apical ischemia. Low risk. Saw Dr. Johnny Bridge preop. Continue prophylactic beta blockers  Major depressive disorder, recurrent, severe without psychotic features.  taking lexapro and restoril Stable without agitation. Followed by Dr. Levonne Spiller, psychiatrist  Hypertension, benign. Borderline elevation. Will add Avapro to HCTZ.(takes valsartan as O.P.) Decrease IV to 50 mL/h.  @PROBHOSP @  LOS: 3 days    Danicia Terhaar M 09/09/2014  . .prob

## 2014-09-10 LAB — CBC
HCT: 29.4 % — ABNORMAL LOW (ref 39.0–52.0)
Hemoglobin: 10.1 g/dL — ABNORMAL LOW (ref 13.0–17.0)
MCH: 24.4 pg — AB (ref 26.0–34.0)
MCHC: 34.4 g/dL (ref 30.0–36.0)
MCV: 71 fL — ABNORMAL LOW (ref 78.0–100.0)
PLATELETS: 335 10*3/uL (ref 150–400)
RBC: 4.14 MIL/uL — AB (ref 4.22–5.81)
RDW: 15 % (ref 11.5–15.5)
WBC: 10.3 10*3/uL (ref 4.0–10.5)

## 2014-09-10 LAB — GLUCOSE, CAPILLARY
GLUCOSE-CAPILLARY: 168 mg/dL — AB (ref 70–99)
GLUCOSE-CAPILLARY: 123 mg/dL — AB (ref 70–99)
Glucose-Capillary: 104 mg/dL — ABNORMAL HIGH (ref 70–99)
Glucose-Capillary: 75 mg/dL (ref 70–99)

## 2014-09-10 LAB — BASIC METABOLIC PANEL
ANION GAP: 7 (ref 5–15)
BUN: 7 mg/dL (ref 6–23)
CALCIUM: 8.2 mg/dL — AB (ref 8.4–10.5)
CHLORIDE: 104 mmol/L (ref 96–112)
CO2: 22 mmol/L (ref 19–32)
CREATININE: 0.92 mg/dL (ref 0.50–1.35)
GFR calc non Af Amer: 88 mL/min — ABNORMAL LOW (ref >90)
Glucose, Bld: 130 mg/dL — ABNORMAL HIGH (ref 70–99)
Potassium: 3.3 mmol/L — ABNORMAL LOW (ref 3.5–5.1)
Sodium: 133 mmol/L — ABNORMAL LOW (ref 135–145)

## 2014-09-10 MED ORDER — HYDROMORPHONE HCL 1 MG/ML IJ SOLN
1.0000 mg | INTRAMUSCULAR | Status: DC | PRN
  Administered 2014-09-10: 1 mg via INTRAVENOUS
  Filled 2014-09-10: qty 1

## 2014-09-10 NOTE — Care Management Note (Signed)
  Page 1 of 1   09/10/2014     1:58:51 PM CARE MANAGEMENT NOTE 09/10/2014  Patient:  Derrick [name], Derrick [name]   Account Number:  [id number]  Date Initiated:  09/07/2014  Documentation initiated by:  [facility]  Subjective/Objective Assessment:   Admitted for abdominal surgery.     Action/Plan:   Anticipated DC Date:     Anticipated DC Plan:  HOME/SELF CARE      DC Planning Services  CM consult      Choice offered to / List presented to:             Status of service:  In process, will continue to follow Medicare Important Message given?  YES (If response is "NO", the following Medicare IM given date fields will be blank) Date Medicare IM given:  09/10/2014 Medicare IM given by:  Magdalen Spatz Date Additional Medicare IM given:   Additional Medicare IM given by:    Discharge Disposition:    Per UR Regulation:  Reviewed for med. necessity/level of care/duration of stay  If discussed at [place] of Stay Meetings, dates discussed:    Comments:  Contact: Echevarria,Christy Daughter [phone] [place] Daughter   [phone]

## 2014-09-10 NOTE — Progress Notes (Signed)
Physical Therapy Treatment Patient Details Name: Derrick [name] MRN: [id number] DOB: 06-19-1951 Today's Date: 09/10/2014    History of Present Illness Pt admitted with colon CA s/p resection    PT Comments    Patient progressing well with mobility. Good demo of log roll technique for bed mobility. Tolerated stair negotiation and improved ambulation distance. Pt chose to use RW for gait today secondary to increased pain in abdomen. RN notified about need for different pain medication as PCA pump not relieving pain. All goals met except for ambulation goal as pt Mod I for ambulation secondary to using RW which pt will have for home use. Encourage ambulation multiple times per day to improve mobility and endurance with supervision. Pt does not require further skilled therapy services. Discharge from therapy.   Follow Up Recommendations  No PT follow up     Equipment Recommendations  None recommended by PT    Recommendations for Other Services       Precautions / Restrictions Precautions Precautions: None Restrictions Weight Bearing Restrictions: No    Mobility  Bed Mobility Overal bed mobility: Needs Assistance Bed Mobility: Rolling Rolling: Modified independent (Device/Increase time) Sidelying to sit: Modified independent (Device/Increase time)       General bed mobility comments: HOB flat, no use of rails to simulate home environment.   Transfers Overall transfer level: Needs assistance Equipment used: None Transfers: Sit to/from Stand Sit to Stand: Supervision         General transfer comment: Supervision for safety.  Ambulation/Gait Ambulation/Gait assistance: Modified independent (Device/Increase time) Ambulation Distance (Feet): 500 Feet Assistive device: Rolling walker (2 wheeled) Gait Pattern/deviations: Step-through pattern;Decreased stride length   Gait velocity interpretation: Below normal speed for age/gender General Gait Details: Pt with slow and  steady gait when using RW. Chose to use RW secondary to increased pain in abdomen today. Tolerated head turns with no LOB.   Stairs Stairs: Yes Stairs assistance: Modified independent (Device/Increase time) Stair Management: One rail Right;Alternating pattern Number of Stairs: 4    Wheelchair Mobility    Modified Rankin (Stroke Patients Only)       Balance Overall balance assessment: Needs assistance Sitting-balance support: Feet supported;No upper extremity supported Sitting balance-Leahy Scale: Normal     Standing balance support: During functional activity Standing balance-Leahy Scale: Fair                      Cognition Arousal/Alertness: Awake/alert Behavior During Therapy: WFL for tasks assessed/performed Overall Cognitive Status: Within Functional Limits for tasks assessed                      Exercises      General Comments        Pertinent Vitals/Pain Pain Assessment: 0-10 Pain Score: 7  Pain Location: abdomen Pain Descriptors / Indicators: Sore;Aching Pain Intervention(s): Limited activity within patient's tolerance;Monitored during session;PCA encouraged;Repositioned    Home Living                      Prior Function            PT Goals (current goals can now be found in the care plan section) Progress towards PT goals: Goals met/education completed, patient discharged from PT    Frequency  Min 3X/week    PT Plan Current plan remains appropriate    Co-evaluation             End of Session Equipment Utilized During  Treatment: Gait belt Activity Tolerance: Patient tolerated treatment well Patient left: in bed;with call bell/phone within reach;with family/visitor present (sitting EOB.)     Time: 1194-1740 PT Time Calculation (min) (ACUTE ONLY): 18 min  Charges:  $Gait Training: 8-22 mins                    G CodesCandy [name] A 2014-09-30, 10:24 AM Derrick [name], [name], DPT [phone]

## 2014-09-10 NOTE — Progress Notes (Signed)
Patient ID: Derrick [name], male   DOB: [birth year], 64 y.o.   MRN: [id number]  [facility]  POD#: 4  Subjective: Patient in bed.  Has had BM's yesterday and today, loose.  Passing flatus.  Ambulating in halls.  On po pain Rx with good relief.  Family at bedside  Objective: Vital signs in last 24 hours: Temp:  [98.2 F (36.8 C)-98.9 F (37.2 C)] 98.2 F (36.8 C) (02/26 1015) Pulse Rate:  [61-82] 73 (02/26 1015) Resp:  [14-26] 21 (02/26 1015) BP: (143-160)/(82-94) 143/94 mmHg (02/26 1015) SpO2:  [93 %-99 %] 99 % (02/26 1015) Last BM Date: 09/10/14 (Simultaneous filing. User may not have seen previous data.)  Intake/Output from previous day: 02/25 0701 - 02/26 0700 In: 1245.8 [I.V.:1245.8] Out: -  Intake/Output this shift: Total I/O In: 480 [P.O.:480] Out: -   Physical Exam: HEENT - sclerae clear, mucous membranes moist Neck - soft Chest - clear bilaterally Cor - RRR Abdomen - soft, obese; dressing dry and intact; active BS Ext - no edema, non-tender Neuro - alert & oriented, no focal deficits  Lab Results:   Recent Labs  09/08/14 0319 09/10/14 0500  WBC 10.0 10.3  HGB 10.3* 10.1*  HCT 30.0* 29.4*  PLT 267 335   BMET  Recent Labs  09/08/14 0319 09/10/14 0500  NA 135 133*  K 3.8 3.3*  CL 106 104  CO2 24 22  GLUCOSE 157* 130*  BUN 8 7  CREATININE 0.95 0.92  CALCIUM 8.3* 8.2*   PT/INR No results for input(s): LABPROT, INR in the last 72 hours. Comprehensive Metabolic Panel:    Component Value Date/Time   NA 133* 09/10/2014 0500   NA 135 09/08/2014 0319   K 3.3* 09/10/2014 0500   K 3.8 09/08/2014 0319   CL 104 09/10/2014 0500   CL 106 09/08/2014 0319   CO2 22 09/10/2014 0500   CO2 24 09/08/2014 0319   BUN 7 09/10/2014 0500   BUN 8 09/08/2014 0319   CREATININE 0.92 09/10/2014 0500   CREATININE 0.95 09/08/2014 0319   GLUCOSE 130* 09/10/2014 0500   GLUCOSE 157* 09/08/2014 0319   CALCIUM 8.2* 09/10/2014 0500   CALCIUM 8.3* 09/08/2014 0319   AST 12 09/06/2014 0851   AST 10 07/24/2014 1122   ALT 10 09/06/2014 0851   ALT 10 07/24/2014 1122   ALKPHOS 60 09/06/2014 0851   ALKPHOS 52 07/24/2014 1122   BILITOT 0.7 09/06/2014 0851   BILITOT 0.5 07/24/2014 1122   PROT 7.4 09/06/2014 0851   PROT 6.7 07/24/2014 1122   ALBUMIN 3.6 09/06/2014 0851   ALBUMIN 3.5 07/24/2014 1122    Studies/Results: No results found.  Anti-infectives: Anti-infectives    Start     Dose/Rate Route Frequency Ordered Stop   09/06/14 2315  cefOXitin (MEFOXIN) 2 g in dextrose 5 % 50 mL IVPB     2 g 100 mL/hr over 30 Minutes Intravenous  Once 09/06/14 2305 09/07/14 0029   09/06/14 2200  cefoTEtan (CEFOTAN) 2 g in dextrose 5 % 50 mL IVPB  Status:  Discontinued     2 g 100 mL/hr over 30 Minutes Intravenous Every 12 hours 09/06/14 1706 09/06/14 2305   09/06/14 0830  cefOXitin (MEFOXIN) 2 g in dextrose 5 % 50 mL IVPB     2 g 100 mL/hr over 30 Minutes Intravenous On call 09/06/14 0817 09/06/14 1125   09/05/14 1125  cefoTEtan (CEFOTAN) 2 g in dextrose 5 % 50 mL  IVPB  Status:  Discontinued     2 g 100 mL/hr over 30 Minutes Intravenous On call to O.R. 09/05/14 1125 09/06/14 0817      Assessment & Plans: Status post right colectomy for Ca  Advance to carb modified regular diet  Encouraged ambulation  PO pain Rx  Await path report  Possible home tomorrow  Earnstine Regal, MD, [facility] Office: [place] 09/10/2014

## 2014-09-11 LAB — GLUCOSE, CAPILLARY: Glucose-Capillary: 71 mg/dL (ref 70–99)

## 2014-09-11 MED ORDER — OXYCODONE-ACETAMINOPHEN 5-325 MG PO TABS: 1.0000 | ORAL_TABLET | ORAL | Status: DC | PRN

## 2014-09-11 NOTE — Progress Notes (Signed)
DC home with family, verbally understood DC instructions with no questions asked

## 2014-09-11 NOTE — Discharge Instructions (Signed)
Laparoscopic Colectomy, Care After °Refer to this sheet in the next few weeks. These instructions provide you with information on caring for yourself after your procedure. Your health care provider may also give you more specific instructions. Your treatment has been planned according to current medical practices, but problems sometimes occur. Call your health care provider if you have any problems or questions after your procedure. °WHAT TO EXPECT AFTER THE PROCEDURE °After your procedure, it is typical to have the following: °· Pain in your abdomen, especially at the incision sites. You will be given pain medicine to control the pain. °· Tiredness. This is a normal part of the recovery process. Your energy level will return to normal over the next several weeks. °· Constipation. You may be given stool softeners to prevent this. °HOME CARE INSTRUCTIONS  °· Only take over-the-counter or prescription medicines as directed by your health care provider. °· Ask your health care provider whether you may take a shower when you go home. °· Remove or change any bandages (dressings) as directed. °· You may resume a normal diet and activities as directed. Eat plenty of fruits and vegetables to help prevent constipation. °· Drink enough fluids to keep your urine clear or pale yellow. This also helps prevent constipation. °· Take rest breaks during the day as needed. °· Avoid lifting anything heavier than 25 pounds (11.3 kg) or driving for 4 weeks or until your health care provider says it is okay. °· Follow up with your health care provider as directed. Ask your health care provider when to make an appointment to get your stitches or staples removed. °SEEK MEDICAL CARE IF:  °· You have increased bleeding from the incision areas. °· You have redness, swelling, or increasing pain in the wounds. °· You see pus coming from a wound. °· You have a fever. °· You notice a foul smell coming from the wound or dressing. °· Your wound is  breaking open (edges not staying together) after sutures or staples have been removed. °SEEK IMMEDIATE MEDICAL CARE IF: °· You develop a rash. °· You have chest pain or difficulty breathing. °· You have pain or swelling in your legs. °· You have lightheadedness or feel faint. °· Your abdomen becomes larger (distended). °· You have nausea or vomiting. °· You have blood in your stools. °Document Released: 01/19/2005 Document Revised: 04/22/2013 Document Reviewed: 02/11/2013 °[employer]® Patient Information ©2015 [employer]. This information is not intended to replace advice given to you by your health care provider. Make sure you discuss any questions you have with your health care provider. ° °

## 2014-09-14 ENCOUNTER — Encounter (HOSPITAL_COMMUNITY): Payer: Self-pay | Admitting: Emergency Medicine

## 2014-09-14 ENCOUNTER — Emergency Department (HOSPITAL_COMMUNITY)
Admission: EM | Admit: 2014-09-14 | Discharge: 2014-09-14 | Disposition: A | Discharge status: Home / Self Care | Payer: Medicare Other | Attending: Emergency Medicine | Admitting: Emergency Medicine

## 2014-09-14 DIAGNOSIS — Z85038 Personal history of other malignant neoplasm of large intestine: Secondary | ICD-10-CM | POA: Diagnosis not present

## 2014-09-14 DIAGNOSIS — Z86018 Personal history of other benign neoplasm: Secondary | ICD-10-CM | POA: Diagnosis not present

## 2014-09-14 DIAGNOSIS — E119 Type 2 diabetes mellitus without complications: Secondary | ICD-10-CM | POA: Diagnosis not present

## 2014-09-14 DIAGNOSIS — Z794 Long term (current) use of insulin: Secondary | ICD-10-CM | POA: Insufficient documentation

## 2014-09-14 DIAGNOSIS — I2510 Atherosclerotic heart disease of native coronary artery without angina pectoris: Secondary | ICD-10-CM | POA: Insufficient documentation

## 2014-09-14 DIAGNOSIS — Z79899 Other long term (current) drug therapy: Secondary | ICD-10-CM | POA: Insufficient documentation

## 2014-09-14 DIAGNOSIS — F319 Bipolar disorder, unspecified: Secondary | ICD-10-CM | POA: Diagnosis not present

## 2014-09-14 DIAGNOSIS — F419 Anxiety disorder, unspecified: Secondary | ICD-10-CM | POA: Insufficient documentation

## 2014-09-14 DIAGNOSIS — Z8719 Personal history of other diseases of the digestive system: Secondary | ICD-10-CM | POA: Diagnosis not present

## 2014-09-14 DIAGNOSIS — I10 Essential (primary) hypertension: Secondary | ICD-10-CM | POA: Insufficient documentation

## 2014-09-14 DIAGNOSIS — R066 Hiccough: Secondary | ICD-10-CM | POA: Insufficient documentation

## 2014-09-14 DIAGNOSIS — Z8669 Personal history of other diseases of the nervous system and sense organs: Secondary | ICD-10-CM | POA: Insufficient documentation

## 2014-09-14 DIAGNOSIS — Z8673 Personal history of transient ischemic attack (TIA), and cerebral infarction without residual deficits: Secondary | ICD-10-CM | POA: Diagnosis not present

## 2014-09-14 DIAGNOSIS — Z7901 Long term (current) use of anticoagulants: Secondary | ICD-10-CM | POA: Insufficient documentation

## 2014-09-14 DIAGNOSIS — T8131XA Disruption of external operation (surgical) wound, not elsewhere classified, initial encounter: Secondary | ICD-10-CM | POA: Insufficient documentation

## 2014-09-14 DIAGNOSIS — M1990 Unspecified osteoarthritis, unspecified site: Secondary | ICD-10-CM | POA: Insufficient documentation

## 2014-09-14 DIAGNOSIS — J189 Pneumonia, unspecified organism: Secondary | ICD-10-CM | POA: Diagnosis not present

## 2014-09-14 DIAGNOSIS — T8189XA Other complications of procedures, not elsewhere classified, initial encounter: Secondary | ICD-10-CM | POA: Diagnosis present

## 2014-09-14 DIAGNOSIS — Y838 Other surgical procedures as the cause of abnormal reaction of the patient, or of later complication, without mention of misadventure at the time of the procedure: Secondary | ICD-10-CM | POA: Insufficient documentation

## 2014-09-14 DIAGNOSIS — Z87891 Personal history of nicotine dependence: Secondary | ICD-10-CM | POA: Insufficient documentation

## 2014-09-14 DIAGNOSIS — E782 Mixed hyperlipidemia: Secondary | ICD-10-CM | POA: Diagnosis not present

## 2014-09-14 MED ORDER — CHLORPROMAZINE HCL 25 MG PO TABS: 25.0000 mg | ORAL_TABLET | Freq: Three times a day (TID) | ORAL | Status: DC | PRN

## 2014-09-14 MED ORDER — HYDROCODONE-ACETAMINOPHEN 5-325 MG PO TABS
1.0000 | ORAL_TABLET | Freq: Once | ORAL | Status: AC
  Administered 2014-09-14: 1 via ORAL
  Filled 2014-09-14: qty 1

## 2014-09-14 MED ORDER — AMOXICILLIN-POT CLAVULANATE 875-125 MG PO TABS: 1.0000 | ORAL_TABLET | Freq: Two times a day (BID) | ORAL | Status: DC

## 2014-09-14 MED ORDER — AMOXICILLIN-POT CLAVULANATE 875-125 MG PO TABS
1.0000 | ORAL_TABLET | Freq: Once | ORAL | Status: AC
  Administered 2014-09-14: 1 via ORAL
  Filled 2014-09-14: qty 1

## 2014-09-14 MED ORDER — CHLORPROMAZINE HCL 25 MG/ML IJ SOLN
25.0000 mg | Freq: Once | INTRAMUSCULAR | Status: AC
  Administered 2014-09-14: 25 mg via INTRAMUSCULAR
  Filled 2014-09-14: qty 1

## 2014-09-14 NOTE — ED Notes (Signed)
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 PT stated he noticed thick drainage with blood coming from his incision today and pt also c/o having hiccups since the day after his surgery. PT reports normal BM on 09/13/14.

## 2014-09-14 NOTE — Discharge Instructions (Signed)
Wear the binder over the wound. Dr. Darrel [facility] office will call you today for an appointment time for tomorrow.  Hiccups A hiccup is the result of a sudden shortening of the muscle below your lungs (diaphragm). This movement of your diaphragm is then followed by the closing of your vocal cords, which causes the hiccup sound. Most people get the hiccups. Typically, hiccups last only a short amount of time. There are three types of hiccups:   Benign: last less than 48 hours.  Persistent: last more than 48 hours, but less than 1 month.  Intractable: last more than 1 month. A hiccup is a reflex. You cannot control reflexes. CAUSES  Causes of the hiccups can include:   Eating too much.  Drinking too much alcohol or fizzy drinks.  Eating too fast.  Eating or drinking hot and spicy foods or drinks.  Using certain medicines that have hiccupping as a side effect. Several medical conditions may also cause hiccups, including, but not limited to:  Stroke.  Gastroesophageal reflux.  Multiple sclerosis.  Traumatic brain injury.  Brain tumor.  Meningitis.  Having damage to the nerve that affects the diaphragm. Usually, though, hiccups have no apparent cause and are not the result of a serious medical condition. DIAGNOSIS  Tests may be performed to diagnose a possible condition associated with persistent or intractable hiccups. TREATMENT  Most cases of the hiccups need no treatment. None of the numerous home remedies have been proven to be effective. If your hiccups do require treatment, your treatment may include:  Medicine. Medicine may be given intravenously (by IV) or by mouth.  Hypnosis or acupuncture.  Surgery to the nerve that affects the diaphragm may be tried in severe cases. If your hiccups are caused by an underlying medical condition, treatment for the medical condition may be necessary.  HOME CARE INSTRUCTIONS   Eat small meals.  Limit alcohol intake to no more than  1 drink per day for nonpregnant women and 2 drinks per day for men. One drink equals 12 ounces of beer, 5 ounces of wine, or 1 ounces of hard liquor.  Limit drinking fizzy drinks.  Eat and chew your food slowly.  Take medicines only as directed by your health care provider. SEEK MEDICAL CARE IF:   Your hiccups last for more than 48 hours.  You are given medicine, but your hiccups do not get better.  You cannot sleep or eat due to the hiccups.  You have unexpected weight loss due to the hiccups.  You have trouble breathing or swallowing.  You have a fever.  You develop severe pain in your abdomen.  You develop numbness, tingling, or weakness. Document Released: 09/10/2001 Document Revised: 11/16/2013 Document Reviewed: 08/23/2010 [facility] Patient Information 2015 [place], [place]. This information is not intended to replace advice given to you by your health care provider. Make sure you discuss any questions you have with your health care provider.  Wound Dehiscence Wound dehiscence is when a surgical cut (incision) breaks open and does not heal properly after surgery. It usually happens 7-10 days after surgery. This can be a serious condition. It is important to identify and treat this condition early.  CAUSES  Some common causes of wound dehiscence include:  Stretching of the wound area. This may be caused by lifting, vomiting, violent coughing, or straining during bowel movements.  Wound infection.  Early stitch (suture) removal. RISK FACTORS Various things can increase your risk of developing wound dehiscence, including:  Obesity.  Lung  disease.  Smoking.  Poor nutrition.  Contamination during surgery. SIGNS AND SYMPTOMS  Bleeding from the wound.  Pain.  Fever.  Wound starts breaking open. DIAGNOSIS  Your health care provider may diagnose wound dehiscence by monitoring the incision and noting any changes in the wound. These changes can include an  increase in drainage or pain. The health care provider may also ask you if you have noticed any stretching or tearing of the wound.  Wound cultures may be taken to determine if there is an infection.  Imaging studies, such as an MRI scan or CT scan, may be done to determine if there is a collection of pus or fluid in the wound area. TREATMENT Treatment may include:  Wound care.  Surgical repair.  Antibiotic medicine to treat or prevent infection.  Medicines to reduce pain and swelling. HOME CARE INSTRUCTIONS   Only take over-the-counter or prescription medicines for pain, discomfort, or fever as directed by your health care provider. Taking pain medicine 30 minutes before changing a bandage (dressing) can help relieve pain.  Take your antibiotics as directed. Finish them even if you start to feel better.  Gently wash the area with mild soap and water 2 times a day, or as directed. Rinse off the soap. Pat the area dry with a clean towel. Do not rub the wound. This may cause bleeding.  Follow your health care provider's instructions for how often you need to change the dressing and packing inside. Wash your hands well before and after changing your dressing. Apply a dressing to the wound as directed.  Take showers. Do not soak the wound, bathe, swim, or use a hot tub until directed by your health care provider.  Avoid exercises that make you sweat heavily.  Use anti-itch medicine as directed by your health care provider. The wound may itch when it is healing. Do not pick or scratch at the wound.  Do not lift more than 10 pounds (4.5 kg) until the wound is healed, or as directed by your health care provider.  Keep all follow-up appointments as directed. SEEK MEDICAL CARE IF:  You have excessive bleeding from your surgical wound.  Your wound does not seem to be healing properly.  You have a fever. SEEK IMMEDIATE MEDICAL CARE IF:   You have increased swelling or redness around  the wound.  You have increasing pain in the wound.  You have an increasing amount of pus coming from the wound.  Your wound breaks open farther. MAKE SURE YOU:   Understand these instructions.  Will watch your condition.  Will get help right away if you are not doing well or get worse. Document Released: 09/22/2003 Document Revised: 07/07/2013 Document Reviewed: 03/09/2013 [facility] Patient Information 2015 [place], [place]. This information is not intended to replace advice given to you by your health care provider. Make sure you discuss any questions you have with your health care provider.

## 2014-09-14 NOTE — ED Notes (Signed)
Per patient had colon resection on 09/06/2014. Per patient started draining a large amount drainage of serosanguinous drainage from incision site. Denies any fevers. Per patient pain/pressure improved after incision site started draining. Patient has towel with pressure applied to site.
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
## 2014-09-17 ENCOUNTER — Encounter (HOSPITAL_COMMUNITY): Payer: Self-pay

## 2014-09-21 ENCOUNTER — Telehealth: Payer: Self-pay | Admitting: Hematology & Oncology

## 2014-09-21 ENCOUNTER — Telehealth: Payer: Self-pay | Admitting: *Deleted

## 2014-09-21 NOTE — Telephone Encounter (Signed)
Received referral for medical oncology from Dr. [name], [place]. Called patient and spoke with him and his daughter and they voiced they would prefer to have care closer to home at [facility] since he lives in [place]. Notified HIM to send this referral to Dr. Whitney Muse.

## 2014-09-21 NOTE — Telephone Encounter (Signed)
Faxed pt medical records to [facility]

## 2014-09-27 ENCOUNTER — Ambulatory Visit (HOSPITAL_COMMUNITY): Payer: Self-pay | Admitting: Psychiatry

## 2014-09-28 ENCOUNTER — Encounter (HOSPITAL_COMMUNITY): Payer: Self-pay | Admitting: Hematology & Oncology

## 2014-09-28 ENCOUNTER — Encounter (HOSPITAL_COMMUNITY): Payer: Medicare Other | Attending: Hematology & Oncology | Admitting: Hematology & Oncology

## 2014-09-28 DIAGNOSIS — C184 Malignant neoplasm of transverse colon: Secondary | ICD-10-CM

## 2014-09-28 DIAGNOSIS — D630 Anemia in neoplastic disease: Secondary | ICD-10-CM | POA: Diagnosis not present

## 2014-09-28 DIAGNOSIS — Z86010 Personal history of colonic polyps: Secondary | ICD-10-CM

## 2014-09-28 DIAGNOSIS — C183 Malignant neoplasm of hepatic flexure: Secondary | ICD-10-CM | POA: Diagnosis present

## 2014-09-28 DIAGNOSIS — C189 Malignant neoplasm of colon, unspecified: Secondary | ICD-10-CM | POA: Diagnosis present

## 2014-09-28 LAB — CBC WITH DIFFERENTIAL/PLATELET
Basophils Absolute: 0 10*3/uL (ref 0.0–0.1)
Basophils Relative: 1 % (ref 0–1)
EOS PCT: 4 % (ref 0–5)
Eosinophils Absolute: 0.3 10*3/uL (ref 0.0–0.7)
HCT: 33.2 % — ABNORMAL LOW (ref 39.0–52.0)
Hemoglobin: 11.5 g/dL — ABNORMAL LOW (ref 13.0–17.0)
Lymphocytes Relative: 38 % (ref 12–46)
Lymphs Abs: 2.7 10*3/uL (ref 0.7–4.0)
MCH: 25.1 pg — AB (ref 26.0–34.0)
MCHC: 34.6 g/dL (ref 30.0–36.0)
MCV: 72.3 fL — AB (ref 78.0–100.0)
MONO ABS: 0.4 10*3/uL (ref 0.1–1.0)
Monocytes Relative: 6 % (ref 3–12)
Neutro Abs: 3.7 10*3/uL (ref 1.7–7.7)
Neutrophils Relative %: 52 % (ref 43–77)
Platelets: 362 10*3/uL (ref 150–400)
RBC: 4.59 MIL/uL (ref 4.22–5.81)
RDW: 16.6 % — AB (ref 11.5–15.5)
WBC: 7.2 10*3/uL (ref 4.0–10.5)

## 2014-09-28 LAB — COMPREHENSIVE METABOLIC PANEL
ALK PHOS: 67 U/L (ref 39–117)
ALT: 15 U/L (ref 0–53)
AST: 16 U/L (ref 0–37)
Albumin: 3.5 g/dL (ref 3.5–5.2)
Anion gap: 8 (ref 5–15)
BILIRUBIN TOTAL: 0.3 mg/dL (ref 0.3–1.2)
BUN: 10 mg/dL (ref 6–23)
CHLORIDE: 110 mmol/L (ref 96–112)
CO2: 21 mmol/L (ref 19–32)
CREATININE: 1.1 mg/dL (ref 0.50–1.35)
Calcium: 8.7 mg/dL (ref 8.4–10.5)
GFR calc Af Amer: 81 mL/min — ABNORMAL LOW (ref >90)
GFR, EST NON AFRICAN AMERICAN: 70 mL/min — AB (ref >90)
Glucose, Bld: 129 mg/dL — ABNORMAL HIGH (ref 70–99)
Potassium: 3.4 mmol/L — ABNORMAL LOW (ref 3.5–5.1)
Sodium: 139 mmol/L (ref 135–145)
Total Protein: 7.4 g/dL (ref 6.0–8.3)

## 2014-09-28 NOTE — Progress Notes (Signed)
Derrick [name] presented for labwork. Labs per MD order drawn via Peripheral Line 23 gauge needle inserted in right AC.  Good blood return present. Procedure without incident.  Needle removed intact. Patient tolerated procedure well.

## 2014-09-28 NOTE — Patient Instructions (Signed)
[place] at [facility] Discharge Instructions  RECOMMENDATIONS MADE BY THE CONSULTANT AND ANY TEST RESULTS WILL BE SENT TO YOUR REFERRING PHYSICIAN.  Return in 3 months for office visit and blood work.   Thank you for choosing [place] at [facility] to provide your oncology and hematology care.  To afford each patient quality time with our provider, please arrive at least 15 minutes before your scheduled appointment time.    You need to re-schedule your appointment should you arrive 10 or more minutes late.  We strive to give you quality time with our providers, and arriving late affects you and other patients whose appointments are after yours.  Also, if you no show three or more times for appointments you may be dismissed from the clinic at the providers discretion.     Again, thank you for choosing [facility].  Our hope is that these requests will decrease the amount of time that you wait before being seen by our physicians.       _____________________________________________________________  Should you have questions after your visit to [facility], please contact our office at [phone] between the hours of 8:30 a.m. and 4:30 p.m.  Voicemails left after 4:30 p.m. will not be returned until the following business day.  For prescription refill requests, have your pharmacy contact our office.

## 2014-09-29 LAB — CEA: CEA: 2.3 ng/mL (ref 0.0–4.7)

## 2014-09-29 LAB — FOLATE: FOLATE: 8.2 ng/mL

## 2014-09-29 LAB — IRON AND TIBC
Iron: 39 ug/dL — ABNORMAL LOW (ref 42–165)
Saturation Ratios: 14 % — ABNORMAL LOW (ref 20–55)
TIBC: 285 ug/dL (ref 215–435)
UIBC: 246 ug/dL (ref 125–400)

## 2014-09-29 LAB — FERRITIN: Ferritin: 197 ng/mL (ref 22–322)

## 2014-09-29 LAB — VITAMIN B12: Vitamin B-12: 315 pg/mL (ref 211–911)

## 2014-10-01 ENCOUNTER — Encounter: Payer: Self-pay | Admitting: Dietician

## 2014-10-01 NOTE — Progress Notes (Signed)
Patient identified to be at risk for malnutrition on the MST secondary to Weight loss  Contacted Pt by Phone   Wt Readings from Last 10 Encounters:  09/14/14 233 lb 5 oz (105.83 kg)  09/06/14 245 lb (111.131 kg)  08/17/14 245 lb (111.131 kg)  07/28/14 247 lb 6.4 oz (112.22 kg)  07/24/14 252 lb (114.306 kg)  07/19/14 248 lb 12.8 oz (112.855 kg)  07/17/14 250 lb (113.399 kg)  06/16/14 253 lb 9.6 oz (115.032 kg)  05/20/14 257 lb 6.4 oz (116.756 kg)  04/20/14 261 lb 6.4 oz (118.57 kg)   Patient weight has ~30 pounds in the last 5 months  Patient reports oral intake as great.  Patient says recently he has had a great appetite. However, on further discussion, it turns out he is only eating a small breakfast and a dinner. Told him it is important to not skip meals and eat atleast a snack high in calories/protein or a meal replacement like a boost/ensure. Pt denies any other issues.     Pt seemed to be receptive to what I had to say  Mailed my contact info, coupons, and handouts titled "Increasing protein and calories" to his NEW ADDRESS: [place]    Burtis Junes RD, LDN Nutrition Pager: [phone] 10/01/2014 10:28 AM
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
## 2014-12-06 ENCOUNTER — Telehealth (HOSPITAL_COMMUNITY): Payer: Self-pay | Admitting: *Deleted

## 2014-12-06 NOTE — Telephone Encounter (Signed)
left message to call office to sch appt due to pharmacy requesting refills and pt have not been seen since January. office number provided

## 2014-12-07 ENCOUNTER — Telehealth (HOSPITAL_COMMUNITY): Payer: Self-pay | Admitting: *Deleted

## 2014-12-07 NOTE — Telephone Encounter (Signed)
No refill until seen

## 2014-12-07 NOTE — Telephone Encounter (Signed)
Pt pharmacy requesting refills for pt Temazepam 15 mg QHS PRN. Pt have been contacted to resch appt for f/u. Message was left. Pt pharmacy number is [phone].

## 2014-12-08 NOTE — Telephone Encounter (Signed)
Pt made appt to f/u with Dr. Harrington Challenger.

## 2014-12-09 ENCOUNTER — Telehealth (HOSPITAL_COMMUNITY): Payer: Self-pay | Admitting: *Deleted

## 2014-12-09 NOTE — Telephone Encounter (Signed)
Called [phone] for prior authorization of Temazepam 15mg . Was sent for clinical review and decision will be faxed.

## 2014-12-14 ENCOUNTER — Ambulatory Visit (INDEPENDENT_AMBULATORY_CARE_PROVIDER_SITE_OTHER): Payer: Medicare Other | Admitting: Psychiatry

## 2014-12-14 ENCOUNTER — Encounter (HOSPITAL_COMMUNITY): Payer: Self-pay | Admitting: Psychiatry

## 2014-12-14 VITALS — BP 125/85 | HR 103 | Ht 68.0 in | Wt 227.0 lb

## 2014-12-14 DIAGNOSIS — F332 Major depressive disorder, recurrent severe without psychotic features: Secondary | ICD-10-CM

## 2014-12-14 MED ORDER — DULOXETINE HCL 60 MG PO CPEP: 60.0000 mg | ORAL_CAPSULE | Freq: Every day | ORAL | Status: DC

## 2014-12-14 MED ORDER — LORAZEPAM 1 MG PO TABS: 1.0000 mg | ORAL_TABLET | Freq: Every day | ORAL | Status: DC
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
## 2014-12-30 ENCOUNTER — Ambulatory Visit (HOSPITAL_COMMUNITY): Payer: Self-pay | Admitting: Hematology & Oncology

## 2014-12-30 ENCOUNTER — Other Ambulatory Visit (HOSPITAL_COMMUNITY): Payer: Self-pay

## 2014-12-31 ENCOUNTER — Encounter (HOSPITAL_COMMUNITY): Payer: Self-pay

## 2015-01-04 ENCOUNTER — Ambulatory Visit (HOSPITAL_COMMUNITY): Payer: Self-pay | Admitting: Psychiatry

## 2015-01-10 ENCOUNTER — Encounter (HOSPITAL_COMMUNITY): Payer: Self-pay | Admitting: Psychiatry

## 2015-01-10 ENCOUNTER — Telehealth (HOSPITAL_COMMUNITY): Payer: Self-pay | Admitting: *Deleted

## 2015-01-17 NOTE — Progress Notes (Signed)
This encounter was created in error - please disregard.

## 2015-01-25 ENCOUNTER — Ambulatory Visit (HOSPITAL_COMMUNITY): Payer: Self-pay | Admitting: Psychiatry

## 2015-01-28 ENCOUNTER — Encounter (HOSPITAL_COMMUNITY): Payer: Self-pay | Admitting: Psychiatry

## 2015-01-31 ENCOUNTER — Ambulatory Visit (HOSPITAL_COMMUNITY): Payer: Self-pay

## 2015-01-31 ENCOUNTER — Ambulatory Visit (HOSPITAL_COMMUNITY): Payer: Self-pay | Admitting: Hematology & Oncology

## 2015-01-31 ENCOUNTER — Encounter (HOSPITAL_COMMUNITY): Payer: Self-pay | Admitting: Hematology & Oncology

## 2015-01-31 ENCOUNTER — Emergency Department (HOSPITAL_COMMUNITY)
Admission: EM | Admit: 2015-01-31 | Discharge: 2015-02-01 | Disposition: A | Discharge status: Home / Self Care | Payer: Medicare Other | Attending: Emergency Medicine | Admitting: Emergency Medicine

## 2015-01-31 ENCOUNTER — Encounter (HOSPITAL_BASED_OUTPATIENT_CLINIC_OR_DEPARTMENT_OTHER): Payer: Medicare Other

## 2015-01-31 ENCOUNTER — Encounter (HOSPITAL_COMMUNITY): Payer: Self-pay | Admitting: Emergency Medicine

## 2015-01-31 ENCOUNTER — Encounter (HOSPITAL_COMMUNITY): Payer: Medicare Other | Attending: Hematology & Oncology | Admitting: Hematology & Oncology

## 2015-01-31 VITALS — BP 145/85 | HR 86 | Temp 98.4°F | Resp 18 | Wt 236.6 lb

## 2015-01-31 DIAGNOSIS — E782 Mixed hyperlipidemia: Secondary | ICD-10-CM | POA: Diagnosis not present

## 2015-01-31 DIAGNOSIS — C189 Malignant neoplasm of colon, unspecified: Secondary | ICD-10-CM

## 2015-01-31 DIAGNOSIS — C183 Malignant neoplasm of hepatic flexure: Secondary | ICD-10-CM

## 2015-01-31 DIAGNOSIS — E1165 Type 2 diabetes mellitus with hyperglycemia: Secondary | ICD-10-CM | POA: Diagnosis not present

## 2015-01-31 DIAGNOSIS — Z79899 Other long term (current) drug therapy: Secondary | ICD-10-CM | POA: Insufficient documentation

## 2015-01-31 DIAGNOSIS — Z794 Long term (current) use of insulin: Secondary | ICD-10-CM | POA: Insufficient documentation

## 2015-01-31 DIAGNOSIS — C182 Malignant neoplasm of ascending colon: Secondary | ICD-10-CM | POA: Diagnosis present

## 2015-01-31 DIAGNOSIS — R110 Nausea: Secondary | ICD-10-CM | POA: Diagnosis not present

## 2015-01-31 DIAGNOSIS — I2510 Atherosclerotic heart disease of native coronary artery without angina pectoris: Secondary | ICD-10-CM | POA: Diagnosis not present

## 2015-01-31 DIAGNOSIS — R739 Hyperglycemia, unspecified: Secondary | ICD-10-CM

## 2015-01-31 DIAGNOSIS — Z8701 Personal history of pneumonia (recurrent): Secondary | ICD-10-CM | POA: Diagnosis not present

## 2015-01-31 DIAGNOSIS — Z7902 Long term (current) use of antithrombotics/antiplatelets: Secondary | ICD-10-CM | POA: Diagnosis not present

## 2015-01-31 DIAGNOSIS — Z8673 Personal history of transient ischemic attack (TIA), and cerebral infarction without residual deficits: Secondary | ICD-10-CM | POA: Diagnosis not present

## 2015-01-31 DIAGNOSIS — Z8719 Personal history of other diseases of the digestive system: Secondary | ICD-10-CM | POA: Insufficient documentation

## 2015-01-31 DIAGNOSIS — Z87891 Personal history of nicotine dependence: Secondary | ICD-10-CM | POA: Diagnosis not present

## 2015-01-31 DIAGNOSIS — Z8669 Personal history of other diseases of the nervous system and sense organs: Secondary | ICD-10-CM | POA: Insufficient documentation

## 2015-01-31 DIAGNOSIS — Z86018 Personal history of other benign neoplasm: Secondary | ICD-10-CM | POA: Diagnosis not present

## 2015-01-31 DIAGNOSIS — Z85048 Personal history of other malignant neoplasm of rectum, rectosigmoid junction, and anus: Secondary | ICD-10-CM | POA: Diagnosis not present

## 2015-01-31 DIAGNOSIS — F419 Anxiety disorder, unspecified: Secondary | ICD-10-CM | POA: Insufficient documentation

## 2015-01-31 DIAGNOSIS — Z86010 Personal history of colonic polyps: Secondary | ICD-10-CM

## 2015-01-31 DIAGNOSIS — F319 Bipolar disorder, unspecified: Secondary | ICD-10-CM | POA: Insufficient documentation

## 2015-01-31 DIAGNOSIS — Z87828 Personal history of other (healed) physical injury and trauma: Secondary | ICD-10-CM | POA: Insufficient documentation

## 2015-01-31 DIAGNOSIS — I10 Essential (primary) hypertension: Secondary | ICD-10-CM | POA: Diagnosis not present

## 2015-01-31 DIAGNOSIS — D630 Anemia in neoplastic disease: Secondary | ICD-10-CM

## 2015-01-31 DIAGNOSIS — Z8739 Personal history of other diseases of the musculoskeletal system and connective tissue: Secondary | ICD-10-CM | POA: Insufficient documentation

## 2015-01-31 LAB — CBC WITH DIFFERENTIAL/PLATELET
Basophils Absolute: 0 10*3/uL (ref 0.0–0.1)
Basophils Relative: 0 % (ref 0–1)
EOS PCT: 3 % (ref 0–5)
Eosinophils Absolute: 0.2 10*3/uL (ref 0.0–0.7)
HCT: 34 % — ABNORMAL LOW (ref 39.0–52.0)
Hemoglobin: 12.1 g/dL — ABNORMAL LOW (ref 13.0–17.0)
LYMPHS ABS: 2.4 10*3/uL (ref 0.7–4.0)
LYMPHS PCT: 33 % (ref 12–46)
MCH: 26.3 pg (ref 26.0–34.0)
MCHC: 35.6 g/dL (ref 30.0–36.0)
MCV: 73.9 fL — ABNORMAL LOW (ref 78.0–100.0)
Monocytes Absolute: 0.4 10*3/uL (ref 0.1–1.0)
Monocytes Relative: 5 % (ref 3–12)
NEUTROS ABS: 4.4 10*3/uL (ref 1.7–7.7)
NEUTROS PCT: 59 % (ref 43–77)
PLATELETS: 206 10*3/uL (ref 150–400)
RBC: 4.6 MIL/uL (ref 4.22–5.81)
RDW: 16.6 % — ABNORMAL HIGH (ref 11.5–15.5)
WBC: 7.4 10*3/uL (ref 4.0–10.5)

## 2015-01-31 LAB — BASIC METABOLIC PANEL
Anion gap: 11 (ref 5–15)
BUN: 13 mg/dL (ref 6–20)
CO2: 19 mmol/L — ABNORMAL LOW (ref 22–32)
Calcium: 8.2 mg/dL — ABNORMAL LOW (ref 8.9–10.3)
Chloride: 101 mmol/L (ref 101–111)
Creatinine, Ser: 1.37 mg/dL — ABNORMAL HIGH (ref 0.61–1.24)
GFR calc Af Amer: >60 mL/min (ref >60)
GFR, EST NON AFRICAN AMERICAN: 53 mL/min — AB (ref >60)
GLUCOSE: 657 mg/dL — AB (ref 65–99)
POTASSIUM: 3.6 mmol/L (ref 3.5–5.1)
Sodium: 131 mmol/L — ABNORMAL LOW (ref 135–145)

## 2015-01-31 LAB — COMPREHENSIVE METABOLIC PANEL
ALT: 11 U/L — AB (ref 17–63)
AST: 17 U/L (ref 15–41)
Albumin: 3.7 g/dL (ref 3.5–5.0)
Alkaline Phosphatase: 67 U/L (ref 38–126)
Anion gap: 11 (ref 5–15)
BUN: 13 mg/dL (ref 6–20)
CO2: 18 mmol/L — AB (ref 22–32)
Calcium: 8.2 mg/dL — ABNORMAL LOW (ref 8.9–10.3)
Chloride: 102 mmol/L (ref 101–111)
Creatinine, Ser: 1.28 mg/dL — ABNORMAL HIGH (ref 0.61–1.24)
GFR calc Af Amer: >60 mL/min (ref >60)
GFR calc non Af Amer: 58 mL/min — ABNORMAL LOW (ref >60)
GLUCOSE: 574 mg/dL — AB (ref 65–99)
POTASSIUM: 4 mmol/L (ref 3.5–5.1)
Sodium: 131 mmol/L — ABNORMAL LOW (ref 135–145)
Total Bilirubin: 0.4 mg/dL (ref 0.3–1.2)
Total Protein: 7.2 g/dL (ref 6.5–8.1)

## 2015-01-31 LAB — CBG MONITORING, ED
Glucose-Capillary: 558 mg/dL (ref 65–99)
Glucose-Capillary: 417 mg/dL — ABNORMAL HIGH (ref 65–99)

## 2015-01-31 LAB — URINALYSIS, ROUTINE W REFLEX MICROSCOPIC
BILIRUBIN URINE: NEGATIVE
Glucose, UA: >1000 mg/dL — AB
Hgb urine dipstick: NEGATIVE
Ketones, ur: NEGATIVE mg/dL
LEUKOCYTES UA: NEGATIVE
Nitrite: NEGATIVE
PROTEIN: NEGATIVE mg/dL
Specific Gravity, Urine: <1.005 — ABNORMAL LOW (ref 1.005–1.030)
Urobilinogen, UA: 0.2 mg/dL (ref 0.0–1.0)
pH: 5.5 (ref 5.0–8.0)

## 2015-01-31 LAB — CBC
HCT: 32.5 % — ABNORMAL LOW (ref 39.0–52.0)
Hemoglobin: 11.7 g/dL — ABNORMAL LOW (ref 13.0–17.0)
MCH: 26.6 pg (ref 26.0–34.0)
MCHC: 36 g/dL (ref 30.0–36.0)
MCV: 73.9 fL — AB (ref 78.0–100.0)
Platelets: 215 10*3/uL (ref 150–400)
RBC: 4.4 MIL/uL (ref 4.22–5.81)
RDW: 16.6 % — ABNORMAL HIGH (ref 11.5–15.5)
WBC: 7.5 10*3/uL (ref 4.0–10.5)

## 2015-01-31 LAB — URINE MICROSCOPIC-ADD ON

## 2015-01-31 LAB — BETA-HYDROXYBUTYRIC ACID: BETA-HYDROXYBUTYRIC ACID: 0.09 mmol/L (ref 0.05–0.27)

## 2015-01-31 LAB — FERRITIN: Ferritin: 201 ng/mL (ref 24–336)

## 2015-01-31 MED ORDER — SODIUM CHLORIDE 0.9 % IV BOLUS (SEPSIS)
1000.0000 mL | Freq: Once | INTRAVENOUS | Status: AC
  Administered 2015-01-31: 1000 mL via INTRAVENOUS

## 2015-01-31 MED ORDER — INSULIN ASPART 100 UNIT/ML ~~LOC~~ SOLN: 15.0000 [IU] | Freq: Once | SUBCUTANEOUS | Status: DC

## 2015-01-31 MED ORDER — SODIUM CHLORIDE 0.9 % IV BOLUS (SEPSIS): 1000.0000 mL | Freq: Once | INTRAVENOUS | Status: DC

## 2015-01-31 MED ORDER — INSULIN ASPART 100 UNIT/ML ~~LOC~~ SOLN
10.0000 [IU] | Freq: Once | SUBCUTANEOUS | Status: AC
  Administered 2015-01-31: 10 [IU] via INTRAVENOUS
  Filled 2015-01-31: qty 1

## 2015-01-31 NOTE — ED Notes (Signed)
Was seen at cancer center for follow up appointment, labs came back with high glucose
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
## 2015-01-31 NOTE — Addendum Note (Signed)
Addended by: Mellissa Kohut on: 01/31/2015 06:21 PM   Modules accepted: Orders, Medications

## 2015-01-31 NOTE — Patient Instructions (Addendum)
Derrick [name] at [facility] Discharge Instructions  RECOMMENDATIONS MADE BY THE CONSULTANT AND ANY TEST RESULTS WILL BE SENT TO YOUR REFERRING PHYSICIAN.  Exam and discussion by Dr. Whitney Muse. Your wound is healing like it should from the inside out.  There is no evidence of infection. If your labs are abnormal we will contact you. Call with any concerns.  Follow-up with labs and office visit in 3 months.   Thank you for choosing [place] at [facility] to provide your oncology and hematology care.  To afford each patient quality time with our provider, please arrive at least 15 minutes before your scheduled appointment time.    You need to re-schedule your appointment should you arrive 10 or more minutes late.  We strive to give you quality time with our providers, and arriving late affects you and other patients whose appointments are after yours.  Also, if you no show three or more times for appointments you may be dismissed from the clinic at the providers discretion.     Again, thank you for choosing [facility].  Our hope is that these requests will decrease the amount of time that you wait before being seen by our physicians.       _____________________________________________________________  Should you have questions after your visit to [facility], please contact our office at [phone] between the hours of 8:30 a.m. and 4:30 p.m.  Voicemails left after 4:30 p.m. will not be returned until the following business day.  For prescription refill requests, have your pharmacy contact our office.

## 2015-01-31 NOTE — ED Notes (Signed)
CRITICAL VALUE ALERT  Critical value received:  Glucose 657  Date of notification:  01/31/2015  Time of notification:  2215  Critical value read back:Yes.    Nurse who received alert:  Toma Deiters  MD notified (1st page):  Dr Betsey Holiday  Time of first page:  2215  MD notified (2nd page):  Time of second page:  Responding MD:  Dr Betsey Holiday  Time MD responded:  2215

## 2015-01-31 NOTE — ED Notes (Signed)
[facility] 417 Dr. Betsey Holiday informed.
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
## 2015-01-31 NOTE — Progress Notes (Signed)
LABS DRAWN

## 2015-02-01 ENCOUNTER — Telehealth (HOSPITAL_COMMUNITY): Payer: Self-pay | Admitting: *Deleted

## 2015-02-01 DIAGNOSIS — E1165 Type 2 diabetes mellitus with hyperglycemia: Secondary | ICD-10-CM | POA: Diagnosis not present

## 2015-02-01 LAB — CBG MONITORING, ED: Glucose-Capillary: 205 mg/dL — ABNORMAL HIGH (ref 65–99)

## 2015-02-01 LAB — CEA: CEA: 2.8 ng/mL (ref 0.0–4.7)

## 2015-02-01 NOTE — Telephone Encounter (Signed)
..  CRITICAL VALUE ALERT Critical value received:  Glucose 574 Date of notification:  01/31/2015 Time of notification: 0569 pm Critical value read back:  Yes.   Nurse who received alert:  TAR MD notified (1st page):  T. Kefalas PA-C notified at 845 pm, Dr. Ancil Linsey notified at [phone] on 7/19. Patient was called  At 830 pm and asked to check his sugar at that time at home. Meter would not register-- stated "high". Nurse advised patient to go to ED

## 2015-02-01 NOTE — ED Notes (Signed)
Pt verbalized understand of care given and followup care.  Pt ambulatory out of ED with family. Verbalized pleased with care given tonight

## 2015-02-01 NOTE — ED Provider Notes (Signed)
Repeat CBG 205. Patient vital signs are stable and cleared for discharge.   Julianne Rice, MD 02/01/15 [phone]

## 2015-02-01 NOTE — Discharge Instructions (Signed)

## 2015-02-08 ENCOUNTER — Encounter (HOSPITAL_BASED_OUTPATIENT_CLINIC_OR_DEPARTMENT_OTHER): Payer: Medicare Other | Admitting: Genetic Counselor

## 2015-02-08 ENCOUNTER — Encounter (HOSPITAL_COMMUNITY): Payer: Self-pay | Admitting: Genetic Counselor

## 2015-02-08 DIAGNOSIS — C189 Malignant neoplasm of colon, unspecified: Secondary | ICD-10-CM

## 2015-02-08 DIAGNOSIS — C183 Malignant neoplasm of hepatic flexure: Secondary | ICD-10-CM | POA: Diagnosis not present

## 2015-02-08 DIAGNOSIS — Z315 Encounter for genetic counseling: Secondary | ICD-10-CM | POA: Diagnosis not present

## 2015-02-08 DIAGNOSIS — C184 Malignant neoplasm of transverse colon: Secondary | ICD-10-CM | POA: Diagnosis not present

## 2015-02-08 DIAGNOSIS — K635 Polyp of colon: Secondary | ICD-10-CM | POA: Insufficient documentation

## 2015-02-08 DIAGNOSIS — Z800 Family history of malignant neoplasm of digestive organs: Secondary | ICD-10-CM | POA: Diagnosis not present
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
## 2015-03-01 ENCOUNTER — Encounter: Payer: Self-pay | Admitting: Genetic Counselor

## 2015-03-01 ENCOUNTER — Ambulatory Visit: Payer: Self-pay | Admitting: Genetic Counselor

## 2015-03-01 ENCOUNTER — Telehealth: Payer: Self-pay | Admitting: Genetic Counselor

## 2015-03-01 DIAGNOSIS — Z1379 Encounter for other screening for genetic and chromosomal anomalies: Secondary | ICD-10-CM

## 2015-03-01 DIAGNOSIS — Z800 Family history of malignant neoplasm of digestive organs: Secondary | ICD-10-CM

## 2015-03-01 DIAGNOSIS — K635 Polyp of colon: Secondary | ICD-10-CM

## 2015-03-01 DIAGNOSIS — C182 Malignant neoplasm of ascending colon: Secondary | ICD-10-CM

## 2015-03-01 NOTE — Telephone Encounter (Signed)
Revealed negative genetic testing on the colorectal cancer.

## 2015-03-01 NOTE — Progress Notes (Signed)
HPI: [name] was previously seen in the [place] clinic due to a personal and family history of colon cancer and concerns regarding a hereditary predisposition to cancer. Please refer to our prior cancer genetics clinic note for more information regarding [name]'s medical, social and family histories, and our assessment and recommendations, at the time. [name]'s recent genetic test results were disclosed to him, as were recommendations warranted by these results. These results and recommendations are discussed in more detail below.  GENETIC TEST RESULTS: At the time of [name]'s visit, we recommended he pursue genetic testing of the Colorectal cancer gene panel. The Colorectal Cancer Panel offered by [facility] includes sequencing and/or duplication/deletion testing of the following 19 genes: APC, ATM, AXIN2, BMPR1A, CDH1, CHEK2, EPCAM, MLH1, MSH2, MSH6, MUTYH, PMS2, POLD1, POLE, PTEN, SCG5/GREM1, SMAD4, STK11, and TP53.  The report date is February 28, 2015.  Genetic testing was normal, and did not reveal a deleterious mutation in these genes. The test report has been scanned into EPIC and is located under the Media tab.   We discussed with [name] that since the current genetic testing is not perfect, it is possible there may be a gene mutation in one of these genes that current testing cannot detect, but that chance is small. We also discussed, that it is possible that another gene that has not yet been discovered, or that we have not yet tested, is responsible for the cancer diagnoses in the family, and it is, therefore, important to remain in touch with cancer genetics in the future so that we can continue to offer [name] the most up to date genetic testing.   CANCER SCREENING RECOMMENDATIONS: This result is reassuring and indicates that [name] likely does not have an increased risk for a future cancer due to a mutation in one of these genes. This normal  test also suggests that [name]'s cancer was most likely not due to an inherited predisposition associated with one of these genes.  Most cancers happen by chance and this negative test suggests that his cancer falls into this category.  We, therefore, recommended he continue to follow the cancer management and screening guidelines provided by his oncology and primary healthcare provider.   RECOMMENDATIONS FOR FAMILY MEMBERS: Women in this family might be at some increased risk of developing cancer, over the general population risk, simply due to the family history of cancer. We recommended women in this family have a yearly mammogram beginning at age 66, or 31 years younger than the earliest onset of cancer, an an annual clinical breast exam, and perform monthly breast self-exams. Women in this family should also have a gynecological exam as recommended by their primary provider. All family members should have a colonoscopy by age 37.  FOLLOW-UP: Lastly, we discussed with [name] that cancer genetics is a rapidly advancing field and it is possible that new genetic tests will be appropriate for him and/or his family members in the future. We encouraged him to remain in contact with cancer genetics on an annual basis so we can update his personal and family histories and let him know of advances in cancer genetics that may benefit this family.   Our contact number was provided. [name]'s questions were answered to his satisfaction, and he knows he is welcome to call us at anytime with additional questions or concerns.   Derrick Kayser, MS, [facility] Certified Genetic Counselor [email or web address]

## 2015-03-17 ENCOUNTER — Encounter (HOSPITAL_COMMUNITY): Payer: Self-pay

## 2015-05-03 ENCOUNTER — Other Ambulatory Visit (HOSPITAL_COMMUNITY): Payer: Self-pay

## 2015-05-03 ENCOUNTER — Ambulatory Visit (HOSPITAL_COMMUNITY): Payer: Self-pay | Admitting: Hematology & Oncology

## 2015-05-03 NOTE — Progress Notes (Signed)
This encounter was created in error - please disregard.

## 2015-05-12 ENCOUNTER — Encounter (HOSPITAL_COMMUNITY): Payer: Self-pay

## 2015-08-01 ENCOUNTER — Encounter (HOSPITAL_COMMUNITY): Payer: Self-pay | Admitting: *Deleted

## 2015-08-01 ENCOUNTER — Observation Stay (HOSPITAL_COMMUNITY): Payer: Medicare Other

## 2015-08-01 ENCOUNTER — Observation Stay (HOSPITAL_COMMUNITY)
Admission: EM | Admit: 2015-08-01 | Discharge: 2015-08-04 | Disposition: A | Discharge status: Home / Self Care | Payer: Medicare Other | Attending: Internal Medicine | Admitting: Internal Medicine

## 2015-08-01 ENCOUNTER — Emergency Department (HOSPITAL_COMMUNITY): Payer: Medicare Other

## 2015-08-01 DIAGNOSIS — E782 Mixed hyperlipidemia: Secondary | ICD-10-CM | POA: Insufficient documentation

## 2015-08-01 DIAGNOSIS — G4733 Obstructive sleep apnea (adult) (pediatric): Secondary | ICD-10-CM | POA: Insufficient documentation

## 2015-08-01 DIAGNOSIS — R079 Chest pain, unspecified: Secondary | ICD-10-CM | POA: Diagnosis present

## 2015-08-01 DIAGNOSIS — Z85048 Personal history of other malignant neoplasm of rectum, rectosigmoid junction, and anus: Secondary | ICD-10-CM | POA: Insufficient documentation

## 2015-08-01 DIAGNOSIS — Z91041 Radiographic dye allergy status: Secondary | ICD-10-CM | POA: Diagnosis not present

## 2015-08-01 DIAGNOSIS — M549 Dorsalgia, unspecified: Secondary | ICD-10-CM | POA: Diagnosis not present

## 2015-08-01 DIAGNOSIS — Z794 Long term (current) use of insulin: Secondary | ICD-10-CM | POA: Diagnosis not present

## 2015-08-01 DIAGNOSIS — R0789 Other chest pain: Principal | ICD-10-CM | POA: Insufficient documentation

## 2015-08-01 DIAGNOSIS — I252 Old myocardial infarction: Secondary | ICD-10-CM | POA: Insufficient documentation

## 2015-08-01 DIAGNOSIS — Z85038 Personal history of other malignant neoplasm of large intestine: Secondary | ICD-10-CM | POA: Insufficient documentation

## 2015-08-01 DIAGNOSIS — Z9119 Patient's noncompliance with other medical treatment and regimen: Secondary | ICD-10-CM | POA: Insufficient documentation

## 2015-08-01 DIAGNOSIS — Z8673 Personal history of transient ischemic attack (TIA), and cerebral infarction without residual deficits: Secondary | ICD-10-CM

## 2015-08-01 DIAGNOSIS — R7989 Other specified abnormal findings of blood chemistry: Secondary | ICD-10-CM | POA: Diagnosis present

## 2015-08-01 DIAGNOSIS — M1990 Unspecified osteoarthritis, unspecified site: Secondary | ICD-10-CM | POA: Diagnosis not present

## 2015-08-01 DIAGNOSIS — E119 Type 2 diabetes mellitus without complications: Secondary | ICD-10-CM | POA: Diagnosis not present

## 2015-08-01 DIAGNOSIS — R918 Other nonspecific abnormal finding of lung field: Secondary | ICD-10-CM

## 2015-08-01 DIAGNOSIS — Z87891 Personal history of nicotine dependence: Secondary | ICD-10-CM | POA: Diagnosis not present

## 2015-08-01 DIAGNOSIS — I2510 Atherosclerotic heart disease of native coronary artery without angina pectoris: Secondary | ICD-10-CM | POA: Insufficient documentation

## 2015-08-01 DIAGNOSIS — C182 Malignant neoplasm of ascending colon: Secondary | ICD-10-CM | POA: Diagnosis not present

## 2015-08-01 DIAGNOSIS — Z9114 Patient's other noncompliance with medication regimen: Secondary | ICD-10-CM | POA: Insufficient documentation

## 2015-08-01 DIAGNOSIS — E039 Hypothyroidism, unspecified: Secondary | ICD-10-CM | POA: Diagnosis not present

## 2015-08-01 DIAGNOSIS — R0602 Shortness of breath: Secondary | ICD-10-CM | POA: Insufficient documentation

## 2015-08-01 DIAGNOSIS — I10 Essential (primary) hypertension: Secondary | ICD-10-CM | POA: Insufficient documentation

## 2015-08-01 DIAGNOSIS — I700 Atherosclerosis of aorta: Secondary | ICD-10-CM | POA: Insufficient documentation

## 2015-08-01 DIAGNOSIS — Z9861 Coronary angioplasty status: Secondary | ICD-10-CM | POA: Insufficient documentation

## 2015-08-01 DIAGNOSIS — F319 Bipolar disorder, unspecified: Secondary | ICD-10-CM | POA: Insufficient documentation

## 2015-08-01 DIAGNOSIS — R911 Solitary pulmonary nodule: Secondary | ICD-10-CM | POA: Diagnosis not present

## 2015-08-01 DIAGNOSIS — G8929 Other chronic pain: Secondary | ICD-10-CM | POA: Insufficient documentation

## 2015-08-01 DIAGNOSIS — K760 Fatty (change of) liver, not elsewhere classified: Secondary | ICD-10-CM | POA: Insufficient documentation

## 2015-08-01 LAB — BASIC METABOLIC PANEL
ANION GAP: 11 (ref 5–15)
BUN: 13 mg/dL (ref 6–20)
CHLORIDE: 106 mmol/L (ref 101–111)
CO2: 20 mmol/L — AB (ref 22–32)
Calcium: 9.2 mg/dL (ref 8.9–10.3)
Creatinine, Ser: 1.12 mg/dL (ref 0.61–1.24)
GFR calc Af Amer: >60 mL/min (ref >60)
GFR calc non Af Amer: >60 mL/min (ref >60)
GLUCOSE: 362 mg/dL — AB (ref 65–99)
POTASSIUM: 3.9 mmol/L (ref 3.5–5.1)
Sodium: 137 mmol/L (ref 135–145)

## 2015-08-01 LAB — CBC
HCT: 40.8 % (ref 39.0–52.0)
HEMOGLOBIN: 14.8 g/dL (ref 13.0–17.0)
MCH: 28.2 pg (ref 26.0–34.0)
MCHC: 36.3 g/dL — AB (ref 30.0–36.0)
MCV: 77.9 fL — AB (ref 78.0–100.0)
Platelets: 243 10*3/uL (ref 150–400)
RBC: 5.24 MIL/uL (ref 4.22–5.81)
RDW: 13.7 % (ref 11.5–15.5)
WBC: 7.2 10*3/uL (ref 4.0–10.5)

## 2015-08-01 LAB — TROPONIN I: Troponin I: <0.03 ng/mL (ref <0.031)

## 2015-08-01 LAB — CBG MONITORING, ED: Glucose-Capillary: 200 mg/dL — ABNORMAL HIGH (ref 65–99)

## 2015-08-01 MED ORDER — DULOXETINE HCL 60 MG PO CPEP
60.0000 mg | ORAL_CAPSULE | Freq: Every day | ORAL | Status: DC
  Administered 2015-08-02 – 2015-08-04 (×3): 60 mg via ORAL
  Filled 2015-08-01 (×3): qty 1

## 2015-08-01 MED ORDER — LORAZEPAM 1 MG PO TABS
1.0000 mg | ORAL_TABLET | Freq: Every day | ORAL | Status: DC
  Administered 2015-08-02 – 2015-08-03 (×3): 1 mg via ORAL
  Filled 2015-08-01: qty 2
  Filled 2015-08-01 (×2): qty 1

## 2015-08-01 MED ORDER — HYDROCHLOROTHIAZIDE 12.5 MG PO CAPS
12.5000 mg | ORAL_CAPSULE | Freq: Every day | ORAL | Status: DC
  Administered 2015-08-02 – 2015-08-04 (×3): 12.5 mg via ORAL
  Filled 2015-08-01 (×3): qty 1

## 2015-08-01 MED ORDER — HEPARIN SODIUM (PORCINE) 5000 UNIT/ML IJ SOLN
5000.0000 [IU] | Freq: Three times a day (TID) | INTRAMUSCULAR | Status: DC
  Administered 2015-08-02 – 2015-08-04 (×7): 5000 [IU] via SUBCUTANEOUS
  Filled 2015-08-01 (×7): qty 1

## 2015-08-01 MED ORDER — INSULIN ASPART 100 UNIT/ML ~~LOC~~ SOLN
0.0000 [IU] | SUBCUTANEOUS | Status: DC
  Administered 2015-08-02 (×2): 5 [IU] via SUBCUTANEOUS
  Administered 2015-08-02: 3 [IU] via SUBCUTANEOUS
  Administered 2015-08-02: 5 [IU] via SUBCUTANEOUS
  Administered 2015-08-03: 2 [IU] via SUBCUTANEOUS
  Administered 2015-08-03: 5 [IU] via SUBCUTANEOUS
  Administered 2015-08-03: 2 [IU] via SUBCUTANEOUS
  Administered 2015-08-03 – 2015-08-04 (×2): 3 [IU] via SUBCUTANEOUS

## 2015-08-01 MED ORDER — LINAGLIPTIN 5 MG PO TABS
5.0000 mg | ORAL_TABLET | Freq: Every day | ORAL | Status: DC
  Administered 2015-08-03 – 2015-08-04 (×2): 5 mg via ORAL
  Filled 2015-08-01 (×3): qty 1

## 2015-08-01 MED ORDER — VALSARTAN-HYDROCHLOROTHIAZIDE 160-12.5 MG PO TABS: 1.0000 | ORAL_TABLET | Freq: Every day | ORAL | Status: DC

## 2015-08-01 MED ORDER — SIMVASTATIN 20 MG PO TABS
40.0000 mg | ORAL_TABLET | Freq: Every evening | ORAL | Status: DC
  Administered 2015-08-02 – 2015-08-03 (×2): 40 mg via ORAL
  Filled 2015-08-01 (×2): qty 2

## 2015-08-01 MED ORDER — ASPIRIN 81 MG PO CHEW
81.0000 mg | CHEWABLE_TABLET | Freq: Every day | ORAL | Status: DC
  Administered 2015-08-02 – 2015-08-03 (×2): 81 mg via ORAL
  Filled 2015-08-01 (×3): qty 1

## 2015-08-01 MED ORDER — ONDANSETRON HCL 4 MG/2ML IJ SOLN: 4.0000 mg | Freq: Three times a day (TID) | INTRAMUSCULAR | Status: AC | PRN

## 2015-08-01 MED ORDER — MORPHINE SULFATE (PF) 4 MG/ML IV SOLN
4.0000 mg | INTRAVENOUS | Status: DC | PRN
  Administered 2015-08-02: 4 mg via INTRAVENOUS
  Filled 2015-08-01 (×2): qty 1

## 2015-08-01 MED ORDER — ONDANSETRON HCL 4 MG/2ML IJ SOLN: 4.0000 mg | Freq: Four times a day (QID) | INTRAMUSCULAR | Status: DC | PRN

## 2015-08-01 MED ORDER — METFORMIN HCL 500 MG PO TABS
1000.0000 mg | ORAL_TABLET | Freq: Two times a day (BID) | ORAL | Status: DC
  Administered 2015-08-02 – 2015-08-04 (×4): 1000 mg via ORAL
  Filled 2015-08-01 (×4): qty 2

## 2015-08-01 MED ORDER — PANTOPRAZOLE SODIUM 40 MG PO TBEC
40.0000 mg | DELAYED_RELEASE_TABLET | Freq: Every day | ORAL | Status: DC
  Administered 2015-08-02 – 2015-08-04 (×3): 40 mg via ORAL
  Filled 2015-08-01 (×3): qty 1

## 2015-08-01 MED ORDER — ASPIRIN 81 MG PO CHEW
324.0000 mg | CHEWABLE_TABLET | Freq: Once | ORAL | Status: AC
  Administered 2015-08-01: 324 mg via ORAL
  Filled 2015-08-01: qty 4

## 2015-08-01 MED ORDER — IRBESARTAN 150 MG PO TABS
150.0000 mg | ORAL_TABLET | Freq: Every day | ORAL | Status: DC
  Administered 2015-08-02 – 2015-08-04 (×3): 150 mg via ORAL
  Filled 2015-08-01 (×3): qty 1

## 2015-08-01 MED ORDER — NITROGLYCERIN 0.4 MG SL SUBL
0.4000 mg | SUBLINGUAL_TABLET | SUBLINGUAL | Status: DC | PRN
  Administered 2015-08-01: 0.4 mg via SUBLINGUAL
  Filled 2015-08-01: qty 1

## 2015-08-01 MED ORDER — SODIUM CHLORIDE 0.9 % IJ SOLN
3.0000 mL | Freq: Two times a day (BID) | INTRAMUSCULAR | Status: DC
  Administered 2015-08-02 – 2015-08-03 (×5): 3 mL via INTRAVENOUS

## 2015-08-01 MED ORDER — INSULIN GLARGINE 100 UNIT/ML ~~LOC~~ SOLN
50.0000 [IU] | Freq: Every day | SUBCUTANEOUS | Status: DC
  Administered 2015-08-02 – 2015-08-03 (×3): 50 [IU] via SUBCUTANEOUS
  Filled 2015-08-01 (×4): qty 0.5

## 2015-08-01 MED ORDER — CLOPIDOGREL BISULFATE 75 MG PO TABS
75.0000 mg | ORAL_TABLET | Freq: Every day | ORAL | Status: DC
  Administered 2015-08-02 – 2015-08-04 (×3): 75 mg via ORAL
  Filled 2015-08-01 (×3): qty 1

## 2015-08-01 MED ORDER — ONDANSETRON HCL 4 MG PO TABS: 4.0000 mg | ORAL_TABLET | Freq: Four times a day (QID) | ORAL | Status: DC | PRN

## 2015-08-01 NOTE — ED Notes (Signed)
Incorrect heartrate on validation. Pt's pulse 79.

## 2015-08-01 NOTE — ED Notes (Signed)
Monitor and Pulse ox not matching on pulse rate due to pt's irregular heartbeat. Rate variable between 60s and 80s.

## 2015-08-01 NOTE — ED Notes (Signed)
Pt has been having chest pain for 3-4 with intermittent SOB and finger tingling. NAD noted upon triage. Pt denies taking any medication.

## 2015-08-01 NOTE — ED Notes (Signed)
Pt given crackers and diet coke. Family at bedside. NAD noted.

## 2015-08-01 NOTE — ED Notes (Signed)
Inconsistency still noted between monitor readings on HR and pulse oximetry readings.
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
## 2015-08-02 ENCOUNTER — Observation Stay (HOSPITAL_COMMUNITY): Payer: Medicare Other

## 2015-08-02 ENCOUNTER — Observation Stay (HOSPITAL_BASED_OUTPATIENT_CLINIC_OR_DEPARTMENT_OTHER): Payer: Medicare Other

## 2015-08-02 DIAGNOSIS — R079 Chest pain, unspecified: Secondary | ICD-10-CM | POA: Diagnosis not present

## 2015-08-02 DIAGNOSIS — R0789 Other chest pain: Secondary | ICD-10-CM | POA: Diagnosis not present

## 2015-08-02 LAB — GLUCOSE, CAPILLARY
GLUCOSE-CAPILLARY: 157 mg/dL — AB (ref 65–99)
GLUCOSE-CAPILLARY: 70 mg/dL (ref 65–99)
GLUCOSE-CAPILLARY: 205 mg/dL — AB (ref 65–99)
GLUCOSE-CAPILLARY: 247 mg/dL — AB (ref 65–99)
Glucose-Capillary: 116 mg/dL — ABNORMAL HIGH (ref 65–99)
Glucose-Capillary: 183 mg/dL — ABNORMAL HIGH (ref 65–99)
Glucose-Capillary: 248 mg/dL — ABNORMAL HIGH (ref 65–99)

## 2015-08-02 LAB — TROPONIN I
Troponin I: <0.03 ng/mL (ref <0.031)
Troponin I: <0.03 ng/mL (ref <0.031)
Troponin I: <0.03 ng/mL (ref <0.031)

## 2015-08-02 LAB — MRSA PCR SCREENING: MRSA BY PCR: NEGATIVE

## 2015-08-02 LAB — TSH: TSH: 15.282 u[IU]/mL — AB (ref 0.350–4.500)

## 2015-08-02 NOTE — Care Management Obs Status (Signed)
[place] NOTIFICATION   Patient Details  Name: Derrick [name] MRN: [id number] Date of Birth: 02/24/1951   Medicare Observation Status Notification Given:  Yes    Sherald Barge, RN 08/02/2015, 4:23 PM

## 2015-08-02 NOTE — Progress Notes (Signed)
Per patients request I spoke directly with Dr. Tommi Rumps informing him that patient no longer wishes to be under his care and would like to have a hospitalist resume the care for him while admitted to [facility]. Dr. Legrand Rams stated he would contact a hospitalist to have the patient transferred to another provider.

## 2015-08-02 NOTE — Progress Notes (Signed)
Left msg with [facility] [id number]) requesting cardiologist consult per Dr. Legrand Rams.

## 2015-08-02 NOTE — Progress Notes (Signed)
Spoke with patient about diabetes and home regimen for diabetes control. Patient reports that he is not being followed by a doctor at this time for diabetes management. Patient states that he was seeing Dr. Legrand Rams and states that he does not want to be seen by Dr. Legrand Rams as an inpatient or an outpatient. Patient states that he told admitting staff that he did not want to have Dr. Legrand Rams as his doctor and patient prefers to be seen by hospitalist while he is an inpatient. Patient states that he would like help with locating a new PCP. Patient reports that currently he takes Lantus 55 units QHS as an outpatient for diabetes control. Inquired about Metformin and Tradjenta (as noted on home medication list) and patient reports that he stopped taking the Metformin and Tradjenta when he decided to stop seeing Dr. Legrand Rams because "it was no use taking the pills when I wasn't going to see that doctor any more."    Inquired about glucose monitoring at home and patient states that his meter is broken and he needs a new glucometer. Discussed importance of checking CBGs and maintaining good CBG control to prevent long-term and short-term complications. Explained how hyperglycemia leads to damage within the blood vessels which leads to complications noted with uncontrolled diabetes. Discussed impact of nutrition, exercise, stress, sickness, and medications on diabetes control. Stressed importance to taking DM medications as prescribed and encouraged patient to check glucose 4 times per day and to follow up with a doctor. Explained how the doctor can use the information from a glucose log to determine what changes to make with DM medications. Patient verbalized understanding of information discussed and he states that he has no further questions at this time related to diabetes.   Talked with Ardine Eng, RN to make her aware of patient's request not to be seen by Dr. Legrand Rams as an inpatient or outpatient. Placed order for CM consult for  assistance with finding a new PCP.  At time of discharge: please provide patient with a Rx for a glucometer, testing supplies, insulin needles, and DM medications that will be prescribed at time of discharge.   Thanks, Barnie Alderman, RN, MSN, CDE Diabetes Coordinator Inpatient Diabetes Program [phone] (Team Pager) [phone] ([facility] office) [phone] [facility] office) [phone] [facility] office)

## 2015-08-02 NOTE — Progress Notes (Signed)
Report given to Lattie Haw, RN on 300. Pt transporting via wheelchair. No complaints of pain, vss. Family at bedside.
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
## 2015-08-02 NOTE — Care Management Note (Signed)
Case Management Note  Patient Details  Name: Derrick [name] MRN: [id number] Date of Birth: Sep 03, 1950  Subjective/Objective:                  Pt admitted r/o CP. Pt is from home, does not live alone and is ind with ADL's. Pt has no HH services or DME's prior to admission. Pt plans to return home with self care at DC.   Action/Plan: No CM needs anticipated.   Expected Discharge Date:    08/03/2015              Expected Discharge Plan:  Home/Self Care  In-House Referral:  NA  Discharge planning Services  CM Consult  Post Acute Care Choice:  NA Choice offered to:  NA  DME Arranged:    DME Agency:     HH Arranged:    HH Agency:     Status of Service:  Completed, signed off  Medicare Important Message Given:    Date Medicare IM Given:    Medicare IM give by:    Date Additional Medicare IM Given:    Additional Medicare Important Message give by:     If discussed at [place] of Stay Meetings, dates discussed:    Additional Comments:  Sherald Barge, RN 08/02/2015, 4:23 PM

## 2015-08-03 DIAGNOSIS — R079 Chest pain, unspecified: Secondary | ICD-10-CM | POA: Diagnosis not present

## 2015-08-03 DIAGNOSIS — R0789 Other chest pain: Secondary | ICD-10-CM | POA: Diagnosis not present

## 2015-08-03 DIAGNOSIS — I2510 Atherosclerotic heart disease of native coronary artery without angina pectoris: Secondary | ICD-10-CM | POA: Diagnosis not present

## 2015-08-03 DIAGNOSIS — I10 Essential (primary) hypertension: Secondary | ICD-10-CM | POA: Diagnosis not present

## 2015-08-03 DIAGNOSIS — R911 Solitary pulmonary nodule: Secondary | ICD-10-CM | POA: Diagnosis not present

## 2015-08-03 DIAGNOSIS — R0602 Shortness of breath: Secondary | ICD-10-CM | POA: Diagnosis not present

## 2015-08-03 DIAGNOSIS — Z8673 Personal history of transient ischemic attack (TIA), and cerebral infarction without residual deficits: Secondary | ICD-10-CM | POA: Diagnosis not present

## 2015-08-03 LAB — GLUCOSE, CAPILLARY
GLUCOSE-CAPILLARY: 134 mg/dL — AB (ref 65–99)
GLUCOSE-CAPILLARY: 203 mg/dL — AB (ref 65–99)
GLUCOSE-CAPILLARY: 72 mg/dL (ref 65–99)
Glucose-Capillary: 168 mg/dL — ABNORMAL HIGH (ref 65–99)
Glucose-Capillary: 129 mg/dL — ABNORMAL HIGH (ref 65–99)
Glucose-Capillary: 114 mg/dL — ABNORMAL HIGH (ref 65–99)

## 2015-08-03 LAB — HEMOGLOBIN A1C
HEMOGLOBIN A1C: 11.6 % — AB (ref 4.8–5.6)
Mean Plasma Glucose: 286 mg/dL

## 2015-08-03 MED ORDER — METOPROLOL TARTRATE 25 MG PO TABS
25.0000 mg | ORAL_TABLET | Freq: Two times a day (BID) | ORAL | Status: DC
  Administered 2015-08-03 – 2015-08-04 (×3): 25 mg via ORAL
  Filled 2015-08-03 (×3): qty 1
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
## 2015-08-04 ENCOUNTER — Observation Stay (HOSPITAL_COMMUNITY): Payer: Medicare Other

## 2015-08-04 ENCOUNTER — Observation Stay (HOSPITAL_BASED_OUTPATIENT_CLINIC_OR_DEPARTMENT_OTHER): Payer: Medicare Other

## 2015-08-04 ENCOUNTER — Encounter (HOSPITAL_COMMUNITY): Payer: Self-pay

## 2015-08-04 DIAGNOSIS — R079 Chest pain, unspecified: Secondary | ICD-10-CM

## 2015-08-04 DIAGNOSIS — R7989 Other specified abnormal findings of blood chemistry: Secondary | ICD-10-CM | POA: Diagnosis present

## 2015-08-04 DIAGNOSIS — C182 Malignant neoplasm of ascending colon: Secondary | ICD-10-CM

## 2015-08-04 DIAGNOSIS — Z91148 Patient's other noncompliance with medication regimen for other reason: Secondary | ICD-10-CM

## 2015-08-04 DIAGNOSIS — R946 Abnormal results of thyroid function studies: Secondary | ICD-10-CM

## 2015-08-04 DIAGNOSIS — Z9114 Patient's other noncompliance with medication regimen: Secondary | ICD-10-CM

## 2015-08-04 DIAGNOSIS — R0789 Other chest pain: Secondary | ICD-10-CM | POA: Diagnosis not present

## 2015-08-04 DIAGNOSIS — Z9861 Coronary angioplasty status: Secondary | ICD-10-CM

## 2015-08-04 DIAGNOSIS — I2510 Atherosclerotic heart disease of native coronary artery without angina pectoris: Secondary | ICD-10-CM

## 2015-08-04 LAB — NM MYOCAR MULTI W/SPECT W/WALL MOTION / EF
CHL CUP NUCLEAR SDS: 2
CHL CUP NUCLEAR SRS: 1
CHL CUP NUCLEAR SSS: 3
LHR: 0.29
LV dias vol: 90 mL
LV sys vol: 49 mL
Peak HR: 103 {beats}/min
Rest HR: 56 {beats}/min
TID: 1.05

## 2015-08-04 LAB — LIPID PANEL
CHOL/HDL RATIO: 3 ratio
CHOLESTEROL: 126 mg/dL (ref 0–200)
HDL: 42 mg/dL (ref >40)
LDL Cholesterol: 74 mg/dL (ref 0–99)
TRIGLYCERIDES: 50 mg/dL (ref <150)
VLDL: 10 mg/dL (ref 0–40)

## 2015-08-04 LAB — GLUCOSE, CAPILLARY
Glucose-Capillary: 100 mg/dL — ABNORMAL HIGH (ref 65–99)
Glucose-Capillary: 109 mg/dL — ABNORMAL HIGH (ref 65–99)
Glucose-Capillary: 188 mg/dL — ABNORMAL HIGH (ref 65–99)

## 2015-08-04 MED ORDER — PANTOPRAZOLE SODIUM 40 MG PO TBEC: 40.0000 mg | DELAYED_RELEASE_TABLET | Freq: Every day | ORAL | Status: DC

## 2015-08-04 MED ORDER — TECHNETIUM TC 99M SESTAMIBI GENERIC - CARDIOLITE
30.0000 | Freq: Once | INTRAVENOUS | Status: AC | PRN
  Administered 2015-08-04: 33 via INTRAVENOUS

## 2015-08-04 MED ORDER — LIOTHYRONINE SODIUM 25 MCG PO TABS
25.0000 ug | ORAL_TABLET | Freq: Every day | ORAL | Status: DC
  Filled 2015-08-04 (×2): qty 1

## 2015-08-04 MED ORDER — REGADENOSON 0.4 MG/5ML IV SOLN
INTRAVENOUS | Status: AC
  Administered 2015-08-04: 0.4 mg via INTRAVENOUS
  Filled 2015-08-04: qty 5

## 2015-08-04 MED ORDER — LEVOTHYROXINE SODIUM 50 MCG PO TABS
50.0000 ug | ORAL_TABLET | Freq: Every day | ORAL | Status: DC
  Administered 2015-08-04: 50 ug via ORAL
  Filled 2015-08-04: qty 1

## 2015-08-04 MED ORDER — TECHNETIUM TC 99M SESTAMIBI - CARDIOLITE
10.0000 | Freq: Once | INTRAVENOUS | Status: AC | PRN
  Administered 2015-08-04: 07:00:00 9.4 via INTRAVENOUS

## 2015-08-04 MED ORDER — LEVOTHYROXINE SODIUM 50 MCG PO TABS: 50.0000 ug | ORAL_TABLET | Freq: Every day | ORAL | Status: DC

## 2015-08-04 MED ORDER — SODIUM CHLORIDE 0.9 % IJ SOLN
INTRAMUSCULAR | Status: AC
  Administered 2015-08-04: 10 mL via INTRAVENOUS
  Filled 2015-08-04: qty 3

## 2015-08-04 MED ORDER — METOPROLOL TARTRATE 25 MG PO TABS: 25.0000 mg | ORAL_TABLET | Freq: Two times a day (BID) | ORAL | Status: DC

## 2015-08-04 MED ORDER — LIOTHYRONINE SODIUM 25 MCG PO TABS: 25.0000 ug | ORAL_TABLET | Freq: Every day | ORAL | Status: DC
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
## 2015-08-04 NOTE — Progress Notes (Addendum)
Stress test without significant ischemia, low risk study. Will d/c aspirin, continue only his plavix which he had been on related to prior CVA, no reason for DAPT. Frequent PACs on tele, would continue metoprolol, would continue. Defer elevated TSH workup to primary team. He will need f/u with [place] in 4 weeks after discharge, we will sign off inpatient care   Zandra Abts MD
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
## 2015-08-04 NOTE — Care Management Note (Signed)
Case Management Note  Patient Details  Name: Derrick [name] MRN: [id number] Date of Birth: 1951/03/15   Expected Discharge Date:    08/04/2015              Expected Discharge Plan:  Home/Self Care  In-House Referral:  NA  Discharge planning Services  CM Consult  Post Acute Care Choice:  NA Choice offered to:  NA  DME Arranged:    DME Agency:     HH Arranged:    [place] Agency:     Status of Service:  Completed, signed off  Medicare Important Message Given:    Date Medicare IM Given:    Medicare IM give by:    Date Additional Medicare IM Given:    Additional Medicare Important Message give by:     If discussed at [place] of Stay Meetings, dates discussed:    Additional Comments: Pt provided with list of PCP's accepting new pt's. Pt discharging home today. No CM needs.  Sherald Barge, RN 08/04/2015, 2:17 PM

## 2015-08-24 ENCOUNTER — Ambulatory Visit (INDEPENDENT_AMBULATORY_CARE_PROVIDER_SITE_OTHER): Payer: Medicare Other | Admitting: Gastroenterology

## 2015-08-24 ENCOUNTER — Encounter: Payer: Self-pay | Admitting: Gastroenterology

## 2015-08-24 ENCOUNTER — Other Ambulatory Visit: Payer: Self-pay

## 2015-08-24 VITALS — BP 170/100 | HR 68 | Temp 98.4°F | Ht 68.0 in | Wt 248.2 lb

## 2015-08-24 DIAGNOSIS — Z85038 Personal history of other malignant neoplasm of large intestine: Secondary | ICD-10-CM | POA: Diagnosis not present

## 2015-08-24 DIAGNOSIS — Z7689 Persons encountering health services in other specified circumstances: Secondary | ICD-10-CM

## 2015-08-24 MED ORDER — PEG 3350-KCL-NA BICARB-NACL 420 G PO SOLR: 4000.0000 mL | Freq: Once | ORAL | Status: DC

## 2015-08-24 NOTE — Patient Instructions (Signed)
We will try and refer you to a primary care doctor.   Please set an appointment up with Dr. Whitney Muse as soon as possible.  You have been scheduled for a colonoscopy with [name] in the near future!

## 2015-08-24 NOTE — Progress Notes (Signed)
No pcp per patient 

## 2015-08-24 NOTE — Assessment & Plan Note (Addendum)
65 year old male with interesting history of synchronous occuring colon cancer, with an ascending colon adenocarcinoma and proximal transverse polyp with adenocarcinoma as well, found on colonoscopy Jan 2016. He was referred to Dr. Dalbert Batman and underwent laparoscopic assisted extended right colectomy in Feb 2016, with resection of second adenocarcinoma as well. He was classified as Stage II. He is due now for surveillance colonoscopy. He needs to establish care with a PCP as well, and we will attempt to arrange this for him. Today's BP 170/100 without any alarm symptoms. HE HAS NOT TAKEN HIS BP MEDS THIS MORNING. Instructed to take as soon as possible. Patient is aware of signs/symptoms that would necessitate urgent evaluation. I am concerned regarding compliance with health recommendations in this nice gentleman.   Proceed with TCS with [name] in near future: the risks, benefits, and alternatives have been discussed with the patient in detail. The patient states understanding and desires to proceed. Propofol for polypharmacy Asked patient to make an appt with Dr. Whitney Muse for follow-up, as he missed the last one.
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
## 2015-09-01 ENCOUNTER — Encounter: Payer: Self-pay | Admitting: Adult Health

## 2015-09-01 ENCOUNTER — Ambulatory Visit (INDEPENDENT_AMBULATORY_CARE_PROVIDER_SITE_OTHER): Payer: Medicare Other | Admitting: Adult Health

## 2015-09-01 VITALS — BP 178/92 | HR 67 | Ht 68.0 in | Wt 243.0 lb

## 2015-09-01 DIAGNOSIS — I2510 Atherosclerotic heart disease of native coronary artery without angina pectoris: Secondary | ICD-10-CM | POA: Diagnosis not present

## 2015-09-01 DIAGNOSIS — I10 Essential (primary) hypertension: Secondary | ICD-10-CM | POA: Diagnosis not present

## 2015-09-01 DIAGNOSIS — R002 Palpitations: Secondary | ICD-10-CM

## 2015-09-01 DIAGNOSIS — E0800 Diabetes mellitus due to underlying condition with hyperosmolarity without nonketotic hyperglycemic-hyperosmolar coma (NKHHC): Secondary | ICD-10-CM

## 2015-09-01 DIAGNOSIS — Z794 Long term (current) use of insulin: Secondary | ICD-10-CM

## 2015-09-01 MED ORDER — CLOPIDOGREL BISULFATE 75 MG PO TABS: 75.0000 mg | ORAL_TABLET | Freq: Every day | ORAL | Status: DC

## 2015-09-01 MED ORDER — LEVOTHYROXINE SODIUM 50 MCG PO TABS: 50.0000 ug | ORAL_TABLET | Freq: Every day | ORAL | Status: DC

## 2015-09-01 MED ORDER — METFORMIN HCL 1000 MG PO TABS: 1000.0000 mg | ORAL_TABLET | Freq: Two times a day (BID) | ORAL | Status: DC

## 2015-09-01 MED ORDER — METOPROLOL TARTRATE 25 MG PO TABS: 25.0000 mg | ORAL_TABLET | Freq: Two times a day (BID) | ORAL | Status: DC

## 2015-09-01 MED ORDER — HYDROCHLOROTHIAZIDE 12.5 MG PO TABS: 12.5000 mg | ORAL_TABLET | Freq: Every day | ORAL | Status: DC

## 2015-09-01 MED ORDER — LINAGLIPTIN 5 MG PO TABS: 5.0000 mg | ORAL_TABLET | Freq: Every day | ORAL | Status: DC

## 2015-09-01 MED ORDER — SIMVASTATIN 40 MG PO TABS: 40.0000 mg | ORAL_TABLET | Freq: Every evening | ORAL | Status: DC

## 2015-09-01 MED ORDER — LIOTHYRONINE SODIUM 25 MCG PO TABS: 25.0000 ug | ORAL_TABLET | Freq: Every day | ORAL | Status: DC

## 2015-09-01 NOTE — Progress Notes (Deleted)
Name: Derrick [name]    DOB: 1951/01/15  Age: 65 y.o.  MR#: [id number]       PCP:  No PCP Per Patient      Insurance: Payor: MEDICARE / Plan: MEDICARE PART A AND B / Product Type: *No Product type* /   CC:   No chief complaint on file.   VS Filed Vitals:   09/01/15 1359  BP: 178/92  Pulse: 67  Height: 5\' 8"  (1.727 m)  Weight: 243 lb (110.224 kg)  SpO2: 98%    Weights Current Weight  09/01/15 243 lb (110.224 kg)  08/24/15 248 lb 3.2 oz (112.583 kg)  08/02/15 239 lb 3.2 oz (108.5 kg)    Blood Pressure  BP Readings from Last 3 Encounters:  09/01/15 178/92  08/24/15 170/100  08/04/15 126/77     Admit date:  (Not on file) Last encounter with RMR:  Visit date not found   Allergy Ivp dye  Current Outpatient Prescriptions  Medication Sig Dispense Refill  . clopidogrel (PLAVIX) 75 MG tablet Take 75 mg by mouth daily.    . DULoxetine (CYMBALTA) 60 MG capsule Take 1 capsule (60 mg total) by mouth daily. 30 capsule 2  . HUMALOG 100 UNIT/ML injection INJECT 10 UNITS INTO THE SKIN THREE TIMES DAILY WITH EACH MEAL  3  . hydrochlorothiazide (HYDRODIURIL) 12.5 MG tablet Take 12.5 mg by mouth daily.  3  . insulin glargine (LANTUS) 100 UNIT/ML injection Inject 50 Units into the skin at bedtime.     . irbesartan (AVAPRO) 75 MG tablet Take 75 mg by mouth daily.  3  . levothyroxine (SYNTHROID, LEVOTHROID) 50 MCG tablet Take 1 tablet (50 mcg total) by mouth daily before breakfast. 30 tablet 2  . liothyronine (CYTOMEL) 25 MCG tablet Take 1 tablet (25 mcg total) by mouth daily. 30 tablet 0  . metFORMIN (GLUCOPHAGE) 1000 MG tablet Take 1,000 mg by mouth 2 (two) times daily. Reported on 08/24/2015    . metoprolol tartrate (LOPRESSOR) 25 MG tablet Take 1 tablet (25 mg total) by mouth 2 (two) times daily. 60 tablet 1  . polyethylene glycol-electrolytes (NULYTELY/GOLYTELY) 420 g solution Take 4,000 mLs by mouth once. 4000 mL 0  . simvastatin (ZOCOR) 40 MG tablet Take 40 mg by mouth every evening.    .  TRADJENTA 5 MG TABS tablet Take 5 mg by mouth daily.      No current facility-administered medications for this visit.    Discontinued Meds:   There are no discontinued medications.  Patient Active Problem List   Diagnosis Date Noted  . History of colon cancer, stage II 08/24/2015  . Elevated TSH 08/04/2015  . Non compliance w medication regimen 08/04/2015  . Chest pain 08/01/2015  . Chest pain with moderate risk of acute coronary syndrome 08/01/2015  . Genetic testing 03/01/2015  . Family history of colon cancer   . Colon polyps   . Cancer of ascending colon ([place]) 09/06/2014  . Preoperative cardiovascular examination 08/17/2014  . CAD S/P percutaneous coronary angioplasty 08/17/2014  . Essential hypertension 08/17/2014  . Mixed hyperlipidemia 08/17/2014  . Type 2 diabetes mellitus ([place]) 08/17/2014  . History of stroke 08/17/2014  . History of colonic polyps   . Abdominal pain 07/19/2014  . Major depression ([place]) 04/20/2014  . Hx of adenomatous colonic polyps 03/19/2012  . High risk medication use 03/19/2012    LABS    Component Value Date/Time   NA 137 08/01/2015 1324   NA 131* 01/31/2015 2144  NA 131* 01/31/2015 1247   K 3.9 08/01/2015 1324   K 3.6 01/31/2015 2144   K 4.0 01/31/2015 1247   CL 106 08/01/2015 1324   CL 101 01/31/2015 2144   CL 102 01/31/2015 1247   CO2 20* 08/01/2015 1324   CO2 19* 01/31/2015 2144   CO2 18* 01/31/2015 1247   GLUCOSE 362* 08/01/2015 1324   GLUCOSE 657* 01/31/2015 2144   GLUCOSE 574* 01/31/2015 1247   BUN 13 08/01/2015 1324   BUN 13 01/31/2015 2144   BUN 13 01/31/2015 1247   CREATININE 1.12 08/01/2015 1324   CREATININE 1.37* 01/31/2015 2144   CREATININE 1.28* 01/31/2015 1247   CALCIUM 9.2 08/01/2015 1324   CALCIUM 8.2* 01/31/2015 2144   CALCIUM 8.2* 01/31/2015 1247   GFRNONAA >60 08/01/2015 1324   GFRNONAA 53* 01/31/2015 2144   GFRNONAA 58* 01/31/2015 1247   GFRAA >60 08/01/2015 1324   GFRAA >60 01/31/2015 2144   GFRAA  >60 01/31/2015 1247   CMP     Component Value Date/Time   NA 137 08/01/2015 1324   K 3.9 08/01/2015 1324   CL 106 08/01/2015 1324   CO2 20* 08/01/2015 1324   GLUCOSE 362* 08/01/2015 1324   BUN 13 08/01/2015 1324   CREATININE 1.12 08/01/2015 1324   CALCIUM 9.2 08/01/2015 1324   PROT 7.2 01/31/2015 1247   ALBUMIN 3.7 01/31/2015 1247   AST 17 01/31/2015 1247   ALT 11* 01/31/2015 1247   ALKPHOS 67 01/31/2015 1247   BILITOT 0.4 01/31/2015 1247   GFRNONAA >60 08/01/2015 1324   GFRAA >60 08/01/2015 1324       Component Value Date/Time   WBC 7.2 08/01/2015 1324   WBC 7.5 01/31/2015 2144   WBC 7.4 01/31/2015 1247   HGB 14.8 08/01/2015 1324   HGB 11.7* 01/31/2015 2144   HGB 12.1* 01/31/2015 1247   HCT 40.8 08/01/2015 1324   HCT 32.5* 01/31/2015 2144   HCT 34.0* 01/31/2015 1247   MCV 77.9* 08/01/2015 1324   MCV 73.9* 01/31/2015 2144   MCV 73.9* 01/31/2015 1247    Lipid Panel     Component Value Date/Time   CHOL 126 08/04/2015 0646   TRIG 50 08/04/2015 0646   HDL 42 08/04/2015 0646   CHOLHDL 3.0 08/04/2015 0646   VLDL 10 08/04/2015 0646   LDLCALC 74 08/04/2015 0646    ABG No results found for: PHART, PCO2ART, PO2ART, HCO3, TCO2, ACIDBASEDEF, O2SAT   Lab Results  Component Value Date   TSH 15.282* 08/01/2015   BNP (last 3 results) No results for input(s): BNP in the last 8760 hours.  ProBNP (last 3 results) No results for input(s): PROBNP in the last 8760 hours.  Cardiac Panel (last 3 results) No results for input(s): CKTOTAL, CKMB, TROPONINI, RELINDX in the last 72 hours.  Iron/TIBC/Ferritin/ %Sat    Component Value Date/Time   IRON 39* 09/28/2014 1615   TIBC 285 09/28/2014 1615   FERRITIN 201 01/31/2015 1247   IRONPCTSAT 14* 09/28/2014 1615     EKG Orders placed or performed during the hospital encounter of 08/01/15  . ED EKG within 10 minutes  . ED EKG within 10 minutes  . EKG  . EKG 12-Lead  . EKG 12-Lead  . EKG 12-Lead  . EKG 12-Lead      Prior Assessment and Plan Problem List as of 09/01/2015      Cardiovascular and Mediastinum   CAD S/P percutaneous coronary angioplasty   Last Assessment & Plan 08/17/2014 Office Visit Written  08/17/2014  4:47 PM by Satira Sark, MD    As noted above, details are not available, including possible previous balloon angioplasty.      Essential hypertension   Last Assessment & Plan 08/17/2014 Office Visit Written 08/17/2014  4:48 PM by Satira Sark, MD    Blood pressure today is normal on Diovan HCT.        Digestive   Cancer of ascending colon [facility])   Colon polyps     Endocrine   Type 2 diabetes mellitus [facility])   Last Assessment & Plan 08/17/2014 Office Visit Written 08/17/2014  4:52 PM by Satira Sark, MD    On Glucophage, followed by Dr. Legrand Rams. Recent random glucose 148, hemoglobin A1c pending.        Other   Hx of adenomatous colonic polyps   Last Assessment & Plan 03/19/2012 Office Visit Written 03/19/2012  9:43 AM by Andria Meuse, NP    Desiree Lucy Bold is a pleasant 65 y.o. male with history of adenomatous colon polyps in 2004. He is due for surveillance colonoscopy.  Denies any GI symptoms at this time. He is on Plavix for coronary artery disease.  We did discuss the fact that he is at a slightly higher risk of GI bleeding given the fact that he is on Plavix, however the benefits of remaining on the plavix outweigh the risk of bleeding. We discussed the life threatening nature of an embolic event. He agrees to remain on plavix for this procedure. He also understands that a second procedure may be required since he is on plavix if he shows signs of significant bleeding or is in need of significant intervention that cannot be performed while on plavix. Other risks discussed include reaction to the medication, perforation, and infection. He agrees with all the above and consent will be obtained.    Half glucophage & glucotrol the day prior to the colonoscopy (date of prep) 1/2 of  your Lantus insulin 27 units the day before your procedure (date of prep) Hold diabetes medications/insulin day of procedure Bring all your medications and/or any insulin to the hospital the day of the procedure. Follow blood sugars, call us or your PCP if any problems.       High risk medication use   Last Assessment & Plan 03/19/2012 Office Visit Edited 03/19/2012  9:43 AM by Andria Meuse, NP    Patient on Plavix. 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 Sees pain management and is under pain contract and unable to change pain regimen. Will reach out to pain management for their participation in helping better manage his pain given the current situation.      History of colonic polyps   Preoperative cardiovascular examination   Last Assessment & Plan 08/17/2014 Office Visit Written 08/17/2014  4:47 PM by Satira Sark, MD    Patient being considered for elective laparoscopic-assisted extended right colectomy under general anesthesia with Dr. Dalbert Batman, diagnoses of colorectal cancer. He has a reported history of remote heart attack and CAD, although unfortunately no details are available for my review. It sounds like he may of undergone a balloon angioplasty 10-20 years ago, and at this point does not endorse any angina symptoms with  basic ADLs. Describes his regular activity as limited. Recent ECG shows LVH with repolarization abnormalities, and he does otherwise have active cardiovascular risk factors including hypertension, type 2 diabetes mellitus, and previous stroke. I discussed the situation with the patient, and have recommended a Lexiscan Cardiolite for evaluation of ischemic burden as a means of further risk stratifying the patient. This will be scheduled,  further recommendations to follow.      Mixed hyperlipidemia   Last Assessment & Plan 08/17/2014 Office Visit Written 08/17/2014  4:50 PM by Satira Sark, MD    On Zocor, followed by Dr. Legrand Rams. I do not have recent lipid information.      History of stroke   Last Assessment & Plan 08/17/2014 Office Visit Written 08/17/2014  4:52 PM by Satira Sark, MD    On Plavix.      Family history of colon cancer   Genetic testing   Chest pain   Chest pain with moderate risk of acute coronary syndrome   Elevated TSH   Non compliance w medication regimen   History of colon cancer, stage II   Last Assessment & Plan 08/24/2015 Office Visit Edited 08/24/2015  9:34 AM by Orvil Feil, NP    65 year old male with interesting history of synchronous occuring colon cancer, with an ascending colon adenocarcinoma and proximal transverse polyp with adenocarcinoma as well, found on colonoscopy Jan 2016. He was referred to Dr. Dalbert Batman and underwent laparoscopic assisted extended right colectomy in Feb 2016, with resection of second adenocarcinoma as well. He was classified as Stage II. He is due now for surveillance colonoscopy. He needs to establish care with a PCP as well, and we will attempt to arrange this for him. Today's BP 170/100 without any alarm symptoms. HE HAS NOT TAKEN HIS BP MEDS THIS MORNING. Instructed to take as soon as possible. Patient is aware of signs/symptoms that would necessitate urgent evaluation. I am concerned regarding compliance with health recommendations in this nice gentleman.   Proceed with TCS with [name] in near future: the risks, benefits, and alternatives have been discussed with the patient in detail. The patient states understanding and desires to proceed. Propofol for polypharmacy Asked patient to make an appt with Dr. Whitney Muse for follow-up, as he missed the last one.           Imaging: Nm Myocar Multi W/spect W/wall Motion / Ef  08/04/2015   There was no ST segment  deviation noted during stress.  No T wave inversion was noted during stress.  The study is normal.  This is a low risk study.  LVEF is low normal by nuclear criteria, approximately 45%.
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
## 2015-09-01 NOTE — Patient Instructions (Signed)
Your physician wants you to follow-up in: 6 Months with Domenic Polite. You will receive a reminder letter in the mail two months in advance. If you don't receive a letter, please call our office to schedule the follow-up appointment.  Your physician recommends that you continue on your current medications as directed. Please refer to the Current Medication list given to you today.  If you need a refill on your cardiac medications before your next appointment, please call your pharmacy.  Thank you for choosing [facility]!

## 2015-09-13 NOTE — Patient Instructions (Addendum)
Your procedure is scheduled on:09/19/2015   Report to [facility] at   6:15  AM.  Call this number if you have problems the morning of surgery: [phone]   Remember:   Do not drink or eat food:After Midnight.  :  Take these medicines the morning of surgery with A SIP OF WATER: Metoprolol, levothyroxine and oxycodone if needed   Do not wear jewelry, make-up or nail polish.  Do not wear lotions, powders, or perfumes. You may wear deodorant.  Do not bring valuables to the hospital.  Contacts, dentures or bridgework may not be worn into surgery.  Leave suitcase in the car. After surgery it may be brought to your room.  For patients admitted to the hospital, checkout time is 11:00 AM the day of discharge.   Patients discharged the day of surgery will not be allowed to drive home. Colonoscopy A colonoscopy is an exam to look at the entire large intestine (colon). This exam can help find problems such as tumors, polyps, inflammation, and areas of bleeding. The exam takes about 1 hour.  LET [facility] CARE PROVIDER KNOW ABOUT:   Any allergies you have.  All medicines you are taking, including vitamins, herbs, eye drops, creams, and over-the-counter medicines.  Previous problems you or members of your family have had with the use of anesthetics.  Any blood disorders you have.  Previous surgeries you have had.  Medical conditions you have. RISKS AND COMPLICATIONS  Generally, this is a safe procedure. However, as with any procedure, complications can occur. Possible complications include:  Bleeding.  Tearing or rupture of the colon wall.  Reaction to medicines given during the exam.  Infection (rare). BEFORE THE PROCEDURE   Ask your health care provider about changing or stopping your regular medicines.  You may be prescribed an oral bowel prep. This involves drinking a large amount of medicated liquid, starting the day before your procedure. The liquid will cause you to have  multiple loose stools until your stool is almost clear or light green. This cleans out your colon in preparation for the procedure.  Do not eat or drink anything else once you have started the bowel prep, unless your health care provider tells you it is safe to do so.  Arrange for someone to drive you home after the procedure. PROCEDURE   You will be given medicine to help you relax (sedative).  You will lie on your side with your knees bent.  A long, flexible tube with a light and camera on the end (colonoscope) will be inserted through the rectum and into the colon. The camera sends video back to a computer screen as it moves through the colon. The colonoscope also releases carbon dioxide gas to inflate the colon. This helps your health care provider see the area better.  During the exam, your health care provider may take a small tissue sample (biopsy) to be examined under a microscope if any abnormalities are found.  The exam is finished when the entire colon has been viewed. AFTER THE PROCEDURE   Do not drive for 24 hours after the exam.  You may have a small amount of blood in your stool.  You may pass moderate amounts of gas and have mild abdominal cramping or bloating. This is caused by the gas used to inflate your colon during the exam.  Ask when your test results will be ready and how you will get your results. Make sure you get your test results.  This information is not intended to replace advice given to you by your health care provider. Make sure you discuss any questions you have with your health care provider.   Document Released: 06/29/2000 Document Revised: 04/22/2013 Document Reviewed: 03/09/2013 Elsevier Interactive Patient Education 2016 Elsevier Inc.   Colonoscopy, Care After Refer to this sheet in the next few weeks. These instructions provide you with information on caring for yourself after your procedure. Your health care provider may also give you more  specific instructions. Your treatment has been planned according to current medical practices, but problems sometimes occur. Call your health care provider if you have any problems or questions after your procedure. WHAT TO EXPECT AFTER THE PROCEDURE  After your procedure, it is typical to have the following:  A small amount of blood in your stool.  Moderate amounts of gas and mild abdominal cramping or bloating. HOME CARE INSTRUCTIONS  Do not drive, operate machinery, or sign important documents for 24 hours.  You may shower and resume your regular physical activities, but move at a slower pace for the first 24 hours.  Take frequent rest periods for the first 24 hours.  Walk around or put a warm pack on your abdomen to help reduce abdominal cramping and bloating.  Drink enough fluids to keep your urine clear or pale yellow.  You may resume your normal diet as instructed by your health care provider. Avoid heavy or fried foods that are hard to digest.  Avoid drinking alcohol for 24 hours or as instructed by your health care provider.  Only take over-the-counter or prescription medicines as directed by your health care provider.  If a tissue sample (biopsy) was taken during your procedure:  Do not take aspirin or blood thinners for 7 days, or as instructed by your health care provider.  Do not drink alcohol for 7 days, or as instructed by your health care provider.  Eat soft foods for the first 24 hours. SEEK MEDICAL CARE IF: You have persistent spotting of blood in your stool 2-3 days after the procedure. SEEK IMMEDIATE MEDICAL CARE IF:  You have more than a small spotting of blood in your stool.  You pass large blood clots in your stool.  Your abdomen is swollen (distended).  You have nausea or vomiting.  You have a fever.  You have increasing abdominal pain that is not relieved with medicine.   This information is not intended to replace advice given to you by your  health care provider. Make sure you discuss any questions you have with your health care provider.   Document Released: 02/14/2004 Document Revised: 04/22/2013 Document Reviewed: 03/09/2013 Elsevier Interactive Patient Education [employer].

## 2015-09-14 ENCOUNTER — Encounter (HOSPITAL_COMMUNITY): Payer: Self-pay

## 2015-09-14 ENCOUNTER — Encounter: Payer: Self-pay | Admitting: Internal Medicine

## 2015-09-14 ENCOUNTER — Encounter (HOSPITAL_COMMUNITY)
Admission: RE | Admit: 2015-09-14 | Discharge: 2015-09-14 | Disposition: A | Discharge status: Home / Self Care | Payer: Medicare Other | Source: Ambulatory Visit | Attending: Internal Medicine | Admitting: Internal Medicine

## 2015-09-14 DIAGNOSIS — Z01812 Encounter for preprocedural laboratory examination: Secondary | ICD-10-CM | POA: Diagnosis not present

## 2015-09-14 HISTORY — DX: Other intervertebral disc degeneration, lumbar region: M51.36

## 2015-09-14 HISTORY — DX: Other intervertebral disc degeneration, lumbar region without mention of lumbar back pain or lower extremity pain: M51.369

## 2015-09-14 LAB — CBC
HCT: 39.8 % (ref 39.0–52.0)
Hemoglobin: 14.2 g/dL (ref 13.0–17.0)
MCH: 27.5 pg (ref 26.0–34.0)
MCHC: 35.7 g/dL (ref 30.0–36.0)
MCV: 77.1 fL — AB (ref 78.0–100.0)
PLATELETS: 281 10*3/uL (ref 150–400)
RBC: 5.16 MIL/uL (ref 4.22–5.81)
RDW: 13.5 % (ref 11.5–15.5)
WBC: 8.4 10*3/uL (ref 4.0–10.5)

## 2015-09-14 LAB — BASIC METABOLIC PANEL
Anion gap: 10 (ref 5–15)
BUN: 26 mg/dL — AB (ref 6–20)
CO2: 21 mmol/L — ABNORMAL LOW (ref 22–32)
CREATININE: 1.47 mg/dL — AB (ref 0.61–1.24)
Calcium: 9.4 mg/dL (ref 8.9–10.3)
Chloride: 102 mmol/L (ref 101–111)
GFR calc Af Amer: 56 mL/min — ABNORMAL LOW (ref >60)
GFR, EST NON AFRICAN AMERICAN: 49 mL/min — AB (ref >60)
GLUCOSE: 327 mg/dL — AB (ref 65–99)
POTASSIUM: 4.6 mmol/L (ref 3.5–5.1)
Sodium: 133 mmol/L — ABNORMAL LOW (ref 135–145)

## 2015-09-14 NOTE — Pre-Procedure Instructions (Signed)
Patient given information to sign up for my chart at home. 

## 2015-09-14 NOTE — Progress Notes (Signed)
Talked with daughter and they have not found a PCP. They are aware that we are putting his TCS off for now.

## 2015-09-14 NOTE — Progress Notes (Signed)
Talked with patient and he asked if I could call back in 15 minutes to talk with his daughter

## 2015-09-19 ENCOUNTER — Ambulatory Visit (HOSPITAL_COMMUNITY): Admission: RE | Admit: 2015-09-19 | Payer: Medicare Other | Source: Ambulatory Visit | Admitting: Internal Medicine

## 2015-09-19 ENCOUNTER — Encounter (HOSPITAL_COMMUNITY): Admission: RE | Payer: Self-pay | Source: Ambulatory Visit

## 2015-09-19 SURGERY — COLONOSCOPY WITH PROPOFOL
Anesthesia: Monitor Anesthesia Care

## 2015-09-21 ENCOUNTER — Other Ambulatory Visit: Payer: Self-pay | Admitting: *Deleted

## 2015-09-21 ENCOUNTER — Telehealth: Payer: Self-pay | Admitting: *Deleted

## 2015-09-21 ENCOUNTER — Ambulatory Visit (INDEPENDENT_AMBULATORY_CARE_PROVIDER_SITE_OTHER): Payer: Medicare Other | Admitting: Internal Medicine

## 2015-09-21 ENCOUNTER — Other Ambulatory Visit: Payer: Self-pay | Admitting: Internal Medicine

## 2015-09-21 ENCOUNTER — Encounter: Payer: Self-pay | Admitting: Internal Medicine

## 2015-09-21 VITALS — BP 151/81 | HR 60 | Temp 98.0°F | Wt 238.2 lb

## 2015-09-21 DIAGNOSIS — R5383 Other fatigue: Secondary | ICD-10-CM

## 2015-09-21 DIAGNOSIS — Z794 Long term (current) use of insulin: Secondary | ICD-10-CM | POA: Diagnosis not present

## 2015-09-21 DIAGNOSIS — R5381 Other malaise: Secondary | ICD-10-CM | POA: Diagnosis not present

## 2015-09-21 DIAGNOSIS — F320 Major depressive disorder, single episode, mild: Secondary | ICD-10-CM

## 2015-09-21 DIAGNOSIS — E039 Hypothyroidism, unspecified: Secondary | ICD-10-CM | POA: Diagnosis not present

## 2015-09-21 DIAGNOSIS — E1165 Type 2 diabetes mellitus with hyperglycemia: Secondary | ICD-10-CM

## 2015-09-21 DIAGNOSIS — Z114 Encounter for screening for human immunodeficiency virus [HIV]: Secondary | ICD-10-CM | POA: Insufficient documentation

## 2015-09-21 DIAGNOSIS — E1100 Type 2 diabetes mellitus with hyperosmolarity without nonketotic hyperglycemic-hyperosmolar coma (NKHHC): Secondary | ICD-10-CM

## 2015-09-21 DIAGNOSIS — Z0000 Encounter for general adult medical examination without abnormal findings: Secondary | ICD-10-CM | POA: Insufficient documentation

## 2015-09-21 DIAGNOSIS — Z79899 Other long term (current) drug therapy: Secondary | ICD-10-CM

## 2015-09-21 DIAGNOSIS — Z87891 Personal history of nicotine dependence: Secondary | ICD-10-CM

## 2015-09-21 DIAGNOSIS — Z1159 Encounter for screening for other viral diseases: Secondary | ICD-10-CM | POA: Diagnosis present

## 2015-09-21 DIAGNOSIS — I10 Essential (primary) hypertension: Secondary | ICD-10-CM

## 2015-09-21 MED ORDER — LISINOPRIL 20 MG PO TABS: 20.0000 mg | ORAL_TABLET | Freq: Every day | ORAL | Status: DC

## 2015-09-21 MED ORDER — "INSULIN SYRINGE 29G X 1/2"" 0.5 ML MISC": 1.0000 [IU] | Freq: Every day | Status: DC

## 2015-09-21 MED ORDER — ADULT BLOOD PRESSURE CUFF LG KIT: 1.0000 [IU] | PACK | Freq: Every day | Status: DC

## 2015-09-21 NOTE — Patient Instructions (Signed)
[name],  It was great to meet you and your daughter today.  We're only making 1 change to your medications:  We'll STOP the HYDROCHLOROTHIAZIDE, and START LISINOPRIL This will help protect your kidneys and help your high blood pressure.  I've also gotten a home health nurse to come help you with your medications.  For your diabetes, we've also sent in a new meter and syringes. Check your blood sugar EVERY MORNING before you eat breakfast.  I've also screened you for HIV and Hepatitis C as we do for all patients. I'll call you if anything is abnormal.  Take care, and we'll see you in 1 month, Dr. Melburn Hake

## 2015-09-21 NOTE — Assessment & Plan Note (Addendum)
He's had fatigue for the last year; I think this is most likely from his hypothyroidism as he has not been taking his Synthroid, but he would like to be screened for HIV as well which I have done today.

## 2015-09-21 NOTE — Progress Notes (Signed)
Patient ID: [name], male   DOB: 11-19-1950, 65 y.o.   MRN: [id number] [facility] Subjective:   Patient ID: Derrick [name] male   DOB: 03-15-1951 65 y.o.   MRN: [id number]  HPI: Derrick [name] is a 65 y.o. male with a history of type 2 diabetes, hypothyroidism, hypertension, stage II colorectal cancer in remission, presenting to clinic to establish care and discuss type 2 diabetes and hypertension.  Type 2 diabetes: His A1c during a hospitalization in January was 11.6; he had not been taking any insulin for the preceding 3 months. Since then, he has been taking 65 units long-acting insulin nightly and he is cutting back on sodas. He has not been checking his sugars because his glucometer is broken, but he denies any symptomatic hypoglycemia.  Hypertension: Since discharge from the hospital in January, he has been compliant with all of his antihypertensive medications. He has not been checking his blood pressures.  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 He used to work at a [employer] but is now retired He quit alcohol in cigarettes 25 years ago, but has a 20-pack-year history He denies any illicit drug use  Review of Systems  Constitutional: Negative for fever, chills, weight loss and malaise/fatigue.  Cardiovascular: Negative for chest pain, palpitations, orthopnea and leg swelling.  Gastrointestinal: Negative for abdominal pain, blood in stool and melena.  Musculoskeletal: Positive for back pain.  Skin: Negative for  rash.  Psychiatric/Behavioral: Negative for depression. The patient is not nervous/anxious.      Objective:  Physical Exam: Filed Vitals:   09/21/15 0828  BP: 151/81  Pulse: 60  Temp: 98 F (36.7 C)  TempSrc: Oral  Weight: 238 lb 3.2 oz (108.047 kg)  SpO2: 100%   General: resting in chair comfortably, appropriately conversational HEENT: no scleral icterus, extra-ocular muscles intact, oropharynx without lesions Cardiac: regular rate and rhythm, no rubs, murmurs or gallops Pulm: breathing well, clear to auscultation bilaterally Abd: bowel sounds normal, soft, nondistended, non-tender Ext: warm and well perfused, without pedal edema Lymph: no cervical or supraclavicular lymphadenopathy Skin: no rash, hair, or nail changes Neuro: alert and oriented X3, cranial nerves II-XII grossly intact, moving all extremities well   Assessment & Plan:  Case discussed with Dr. Evette Doffing  Type 2 diabetes mellitus ([place]) During her hospitalization in January 2017 for atypical angina, his A1c was 11.6; he had not been taking any of his medications for the preceding 3 months. Since the hospitalization, he has been compliant with his glargine 65 units nightly, metformin 1000 mg twice daily, and linagliptin 5 mg daily. He is working on his diabetic cutting out sodas. He has not been checking his sugars because his glucometer is broken.  Today, I've given him a new glucometer and syringes. I haven't made any medication changes because I'll wait for his morning sugars and next a1c. At the next visit in 1 month, if he continues to be poorly controlled, I think we should consider switching his linagliptin to an SGLT-2 or GLP-1 agonist for its cardiovascular mortality benefit. We  should also revisit the idea of referral to [facility] for further education.  Essential hypertension He was slightly hypertensive today at 151/80, with a goal of less than 140/90 given his diabetes and history of stroke. He was on  hydrochlorothiazide 12.5 mg daily. Given his concrement and diabetes, I changed this to lisinopril 20 mg daily. At his next visit, should he continue to be hypertensive, we can uptitrate the lisinopril. I've also given him a blood pressure cuff; his daughter is a Marine scientist, and he would like to start checking his blood pressures in the morning.  Need for hepatitis C screening test He would like to be screened for hepatitis C, so we've done this today.  Malaise and fatigue He's had fatigue for the last year; I think this is most likely from his hypothyroidism as he has not been taking his Synthroid, but he would like to be screened for HIV as well which I have done today.   Medications Ordered Meds ordered this encounter  Medications  . lisinopril (PRINIVIL,ZESTRIL) 20 MG tablet    Sig: Take 1 tablet (20 mg total) by mouth daily.    Dispense:  90 tablet    Refill:  3  . Blood Pressure Monitoring (ADULT BLOOD PRESSURE CUFF LG) KIT    Sig: 1 Units by Does not apply route daily.    Dispense:  1 each    Refill:  0   Other Orders Orders Placed This Encounter  Procedures  . HIV antibody (with reflex)  . Hepatitis C antibody  . Ambulatory referral to Social Work    Referral Priority:  Routine    Referral Type:  Consultation    Referral Reason:  Specialty Services Required    Number of Visits Requested:  1   Follow Up: Return in about 4 weeks (around 10/19/2015).

## 2015-09-21 NOTE — Telephone Encounter (Addendum)
Pt here for first time visit and stated that his glucose meter was broken and requested assistance in obtaining a new one.  I contacted his pharmacy to see which meter type patient had and was informed that pt's insurance will only pay for an easymax meter. Meter and testing supplies called into pharmacy.

## 2015-09-21 NOTE — Assessment & Plan Note (Signed)
He was slightly hypertensive today at 151/80, with a goal of less than 140/90 given his diabetes and history of stroke. He was on hydrochlorothiazide 12.5 mg daily. Given his concrement and diabetes, I changed this to lisinopril 20 mg daily. At his next visit, should he continue to be hypertensive, we can uptitrate the lisinopril. I've also given him a blood pressure cuff; his daughter is a Marine scientist, and he would like to start checking his blood pressures in the morning.

## 2015-09-21 NOTE — Assessment & Plan Note (Signed)
He would like to be screened for hepatitis C, so we've done this today.

## 2015-09-21 NOTE — Assessment & Plan Note (Signed)
During her hospitalization in January 2017 for atypical angina, his A1c was 11.6; he had not been taking any of his medications for the preceding 3 months. Since the hospitalization, he has been compliant with his glargine 65 units nightly, metformin 1000 mg twice daily, and linagliptin 5 mg daily. He is working on his diabetic cutting out sodas. He has not been checking his sugars because his glucometer is broken.  Today, I've given him a new glucometer and syringes. I haven't made any medication changes because I'll wait for his morning sugars and next a1c. At the next visit in 1 month, if he continues to be poorly controlled, I think we should consider switching his linagliptin to an SGLT-2 or GLP-1 agonist for its cardiovascular mortality benefit. We should also revisit the idea of referral to [facility] for further education.

## 2015-09-21 NOTE — Progress Notes (Signed)
Internal Medicine Clinic Attending  Case discussed with Dr. Flores at the time of the visit.  We reviewed the resident's history and exam and pertinent patient test results.  I agree with the assessment, diagnosis, and plan of care documented in the resident's note. 

## 2015-09-22 LAB — HEPATITIS C ANTIBODY: Hep C Virus Ab: <0.1 s/co ratio (ref 0.0–0.9)

## 2015-09-22 LAB — HIV ANTIBODY (ROUTINE TESTING W REFLEX): HIV Screen 4th Generation wRfx: NONREACTIVE

## 2015-10-17 ENCOUNTER — Encounter: Payer: Self-pay | Admitting: Internal Medicine

## 2015-10-18 ENCOUNTER — Other Ambulatory Visit: Payer: Self-pay

## 2015-10-18 ENCOUNTER — Encounter: Payer: Self-pay | Admitting: Gastroenterology

## 2015-10-18 ENCOUNTER — Ambulatory Visit (INDEPENDENT_AMBULATORY_CARE_PROVIDER_SITE_OTHER): Payer: Medicare Other | Admitting: Gastroenterology

## 2015-10-18 VITALS — BP 160/90 | HR 59 | Temp 97.3°F | Ht 68.0 in | Wt 241.2 lb

## 2015-10-18 DIAGNOSIS — Z85038 Personal history of other malignant neoplasm of large intestine: Secondary | ICD-10-CM

## 2015-10-18 DIAGNOSIS — I2510 Atherosclerotic heart disease of native coronary artery without angina pectoris: Secondary | ICD-10-CM | POA: Diagnosis not present
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
## 2015-10-18 NOTE — Patient Instructions (Addendum)
Start taking supplemental fiber such as Metamucil or Benefiber.   We have scheduled you for a colonoscopy with [name] in the near future.   Do not take any diabetes medication the day of the procedure. Only take 1/2 dose of Lantus the night before.

## 2015-10-19 DIAGNOSIS — Z85038 Personal history of other malignant neoplasm of large intestine: Secondary | ICD-10-CM | POA: Insufficient documentation

## 2015-10-19 NOTE — Assessment & Plan Note (Addendum)
65 year old with interesting history of synchronous occuring colon cancer with ascending colon adenocarcinoma and proximal transverse polyp with adenocarcinoma, s/p laparoscopic assisted extended right colectomy in Feb 2016, due for surveillance colonoscopy. He has now established care with a PCP, as requested at last visit. No concerning lower GI symptoms, although he does note more liquid stools since colectomy, which is chronic.   Proceed with TCS with [name] in near future: the risks, benefits, and alternatives have been discussed with the patient in detail. The patient states understanding and desires to proceed. PROPOFOL due to polypharmacy

## 2015-10-26 ENCOUNTER — Telehealth: Payer: Self-pay | Admitting: Licensed Clinical Social Worker

## 2015-10-26 NOTE — Telephone Encounter (Signed)
CSW placed call to Mr. Grismer to follow up on home health referral.  [facility] states they did not receive faxed referral in March 2017.  CSW placed call to Mr. Tuch to determine if [facility] RN for med management was still desired by patient.  Mr. Surti confirmed that he has not been contacted by home health and states "I would like my daughter to get paid to do it.  She's a nurse."  Mr. Altomari requested this worker to speak with daughter.  Daughter states she is an CNA that has worked at [employer] in the past but not currently working.  Daughter states she currently provides assistance with pt's pill box.  CSW inquired if pt/family would be interested in having a HH RN to come to the home to assist with medication education and management.  Pt/family decline at this time.  Further discussion regarding CAP and PCS provided to pt/family.

## 2015-10-31 ENCOUNTER — Encounter: Payer: Self-pay | Admitting: Internal Medicine

## 2015-10-31 ENCOUNTER — Ambulatory Visit (INDEPENDENT_AMBULATORY_CARE_PROVIDER_SITE_OTHER): Payer: Medicare Other | Admitting: Internal Medicine

## 2015-10-31 VITALS — BP 142/81 | HR 63 | Temp 98.2°F | Ht 68.0 in | Wt 245.6 lb

## 2015-10-31 DIAGNOSIS — Z87891 Personal history of nicotine dependence: Secondary | ICD-10-CM

## 2015-10-31 DIAGNOSIS — E1165 Type 2 diabetes mellitus with hyperglycemia: Secondary | ICD-10-CM | POA: Diagnosis present

## 2015-10-31 DIAGNOSIS — Z794 Long term (current) use of insulin: Secondary | ICD-10-CM | POA: Diagnosis not present

## 2015-10-31 DIAGNOSIS — E1100 Type 2 diabetes mellitus with hyperosmolarity without nonketotic hyperglycemic-hyperosmolar coma (NKHHC): Secondary | ICD-10-CM

## 2015-10-31 LAB — POCT GLYCOSYLATED HEMOGLOBIN (HGB A1C): Hemoglobin A1C: 7.7

## 2015-10-31 LAB — GLUCOSE, CAPILLARY: Glucose-Capillary: 121 mg/dL — ABNORMAL HIGH (ref 65–99)

## 2015-10-31 MED ORDER — INSULIN GLARGINE 100 UNIT/ML ~~LOC~~ SOLN: 67.0000 [IU] | Freq: Every day | SUBCUTANEOUS | Status: DC
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
## 2015-10-31 NOTE — Patient Instructions (Signed)
Start taking 67 units of Lantus every night and stop drinking sodas.

## 2015-10-31 NOTE — Assessment & Plan Note (Addendum)
Lab Results  Component Value Date   HGBA1C 7.7 10/31/2015   HGBA1C 11.6* 08/01/2015   HGBA1C 8.5* 09/06/2014     Assessment: Diabetes control:  not controlled Progress toward A1C goal:   near goal Comments: on metformin 1000mg  bid, tradjenta 5mg  daily, and lantus 65 units qhs. Denies sx of hypoglycemia. Has not been adherent to DM diet. Review of glucometer ranges from 92-168.  Plan: Medications:  continue current medications. Increased lantus to 67 units qhs.  Home glucose monitoring: Frequency:  BID Timing:  am and pm Instruction/counseling given: discussed diet Educational resources provided: brochure (denies) Self management tools provided: copy of home glucose meter download Other plans: f/u in 3 months. Pt agrees to cut down on soda, bread, and rice intake.

## 2015-10-31 NOTE — Progress Notes (Signed)
Case discussed with Dr. Truong at the time of the visit.  We reviewed the resident's history and exam and pertinent patient test results.  I agree with the assessment, diagnosis, and plan of care documented in the resident's note. 

## 2015-11-09 ENCOUNTER — Other Ambulatory Visit: Payer: Self-pay | Admitting: Adult Health

## 2015-11-09 NOTE — Patient Instructions (Signed)
Shanan Bennington Perkey  11/09/2015     @PREFPERIOPPHARMACY @   Your procedure is scheduled on  11/14/2015   Report to [facility] at  23  A.M.  Call this number if you have problems the morning of surgery:  [phone]   Remember:  Do not eat food or drink liquids after midnight.  Take these medicines the morning of surgery with A SIP OF WATER  Levothyroxine, cytomel, lisinopril, metoprolol, oxycodone. Take 1/2 of your usual Insulin dosage the night before your procedure. DO NOT take any medications for diabetes the morning of your procedure.   Do not wear jewelry, make-up or nail polish.  Do not wear lotions, powders, or perfumes.  You may wear deodorant.  Do not shave 48 hours prior to surgery.  Men may shave face and neck.  Do not bring valuables to the hospital.  [facility] is not responsible for any belongings or valuables.  Contacts, dentures or bridgework may not be worn into surgery.  Leave your suitcase in the car.  After surgery it may be brought to your room.  For patients admitted to the hospital, discharge time will be determined by your treatment team.  Patients discharged the day of surgery will not be allowed to drive home.   Name and phone number of your driver:   family Special instructions:  Follow the diet and prep instructions given to you by Dr Roseanne Kaufman office.  Please read over the following fact sheets that you were given. Coughing and Deep Breathing, Surgical Site Infection Prevention, Anesthesia Post-op Instructions and Care and Recovery After Surgery      Colonoscopy A colonoscopy is an exam to look at the entire large intestine (colon). This exam can help find problems such as tumors, polyps, inflammation, and areas of bleeding. The exam takes about 1 hour.  LET [facility] CARE PROVIDER KNOW ABOUT:   Any allergies you have.  All medicines you are taking, including vitamins, herbs, eye drops, creams, and over-the-counter  medicines.  Previous problems you or members of your family have had with the use of anesthetics.  Any blood disorders you have.  Previous surgeries you have had.  Medical conditions you have. RISKS AND COMPLICATIONS  Generally, this is a safe procedure. However, as with any procedure, complications can occur. Possible complications include:  Bleeding.  Tearing or rupture of the colon wall.  Reaction to medicines given during the exam.  Infection (rare). BEFORE THE PROCEDURE   Ask your health care provider about changing or stopping your regular medicines.  You may be prescribed an oral bowel prep. This involves drinking a large amount of medicated liquid, starting the day before your procedure. The liquid will cause you to have multiple loose stools until your stool is almost clear or light green. This cleans out your colon in preparation for the procedure.  Do not eat or drink anything else once you have started the bowel prep, unless your health care provider tells you it is safe to do so.  Arrange for someone to drive you home after the procedure. PROCEDURE   You will be given medicine to help you relax (sedative).  You will lie on your side with your knees bent.  A long, flexible tube with a light and camera on the end (colonoscope) will be inserted through the rectum and into the colon. The camera sends video back to a computer screen as it moves through the colon. The colonoscope  also releases carbon dioxide gas to inflate the colon. This helps your health care provider see the area better.  During the exam, your health care provider may take a small tissue sample (biopsy) to be examined under a microscope if any abnormalities are found.  The exam is finished when the entire colon has been viewed. AFTER THE PROCEDURE   Do not drive for 24 hours after the exam.  You may have a small amount of blood in your stool.  You may pass moderate amounts of gas and have mild  abdominal cramping or bloating. This is caused by the gas used to inflate your colon during the exam.  Ask when your test results will be ready and how you will get your results. Make sure you get your test results.   This information is not intended to replace advice given to you by your health care provider. Make sure you discuss any questions you have with your health care provider.   Document Released: 06/29/2000 Document Revised: 04/22/2013 Document Reviewed: 03/09/2013 Elsevier Interactive Patient Education 2016 [employer] Colonoscopy, Care After Refer to this sheet in the next few weeks. These instructions provide you with information on caring for yourself after your procedure. Your health care provider may also give you more specific instructions. Your treatment has been planned according to current medical practices, but problems sometimes occur. Call your health care provider if you have any problems or questions after your procedure. WHAT TO EXPECT AFTER THE PROCEDURE  After your procedure, it is typical to have the following:  A small amount of blood in your stool.  Moderate amounts of gas and mild abdominal cramping or bloating. HOME CARE INSTRUCTIONS  Do not drive, operate machinery, or sign important documents for 24 hours.  You may shower and resume your regular physical activities, but move at a slower pace for the first 24 hours.  Take frequent rest periods for the first 24 hours.  Walk around or put a warm pack on your abdomen to help reduce abdominal cramping and bloating.  Drink enough fluids to keep your urine clear or pale yellow.  You may resume your normal diet as instructed by your health care provider. Avoid heavy or fried foods that are hard to digest.  Avoid drinking alcohol for 24 hours or as instructed by your health care provider.  Only take over-the-counter or prescription medicines as directed by your health care provider.  If a tissue sample  (biopsy) was taken during your procedure:  Do not take aspirin or blood thinners for 7 days, or as instructed by your health care provider.  Do not drink alcohol for 7 days, or as instructed by your health care provider.  Eat soft foods for the first 24 hours. SEEK MEDICAL CARE IF: You have persistent spotting of blood in your stool 2-3 days after the procedure. SEEK IMMEDIATE MEDICAL CARE IF:  You have more than a small spotting of blood in your stool.  You pass large blood clots in your stool.  Your abdomen is swollen (distended).  You have nausea or vomiting.  You have a fever.  You have increasing abdominal pain that is not relieved with medicine.   This information is not intended to replace advice given to you by your health care provider. Make sure you discuss any questions you have with your health care provider.   Document Released: 02/14/2004 Document Revised: 04/22/2013 Document Reviewed: 03/09/2013 Elsevier Interactive Patient Education 2016 [place] POST-ANESTHESIA  IMMEDIATELY FOLLOWING SURGERY:  Do not drive or operate machinery for the first twenty four hours after surgery.  Do not make any important decisions for twenty four hours after surgery or while taking narcotic pain medications or sedatives.  If you develop intractable nausea and vomiting or a severe headache please notify your doctor immediately.  FOLLOW-UP:  Please make an appointment with your surgeon as instructed. You do not need to follow up with anesthesia unless specifically instructed to do so.  WOUND CARE INSTRUCTIONS (if applicable):  Keep a dry clean dressing on the anesthesia/puncture wound site if there is drainage.  Once the wound has quit draining you may leave it open to air.  Generally you should leave the bandage intact for twenty four hours unless there is drainage.  If the epidural site drains for more than 36-48 hours please call the anesthesia  department.  QUESTIONS?:  Please feel free to call your physician or the hospital operator if you have any questions, and they will be happy to assist you.

## 2015-11-10 ENCOUNTER — Encounter (HOSPITAL_COMMUNITY): Payer: Self-pay

## 2015-11-10 ENCOUNTER — Encounter (HOSPITAL_COMMUNITY)
Admission: RE | Admit: 2015-11-10 | Discharge: 2015-11-10 | Disposition: A | Discharge status: Home / Self Care | Payer: Medicare Other | Source: Ambulatory Visit | Attending: Internal Medicine | Admitting: Internal Medicine

## 2015-11-10 DIAGNOSIS — E119 Type 2 diabetes mellitus without complications: Secondary | ICD-10-CM | POA: Diagnosis not present

## 2015-11-10 DIAGNOSIS — I10 Essential (primary) hypertension: Secondary | ICD-10-CM | POA: Insufficient documentation

## 2015-11-10 DIAGNOSIS — G4730 Sleep apnea, unspecified: Secondary | ICD-10-CM | POA: Diagnosis not present

## 2015-11-10 DIAGNOSIS — E782 Mixed hyperlipidemia: Secondary | ICD-10-CM | POA: Diagnosis not present

## 2015-11-10 DIAGNOSIS — Z01812 Encounter for preprocedural laboratory examination: Secondary | ICD-10-CM | POA: Insufficient documentation

## 2015-11-10 DIAGNOSIS — E039 Hypothyroidism, unspecified: Secondary | ICD-10-CM | POA: Insufficient documentation

## 2015-11-10 DIAGNOSIS — C189 Malignant neoplasm of colon, unspecified: Secondary | ICD-10-CM | POA: Diagnosis not present

## 2015-11-10 LAB — BASIC METABOLIC PANEL
Anion gap: 8 (ref 5–15)
BUN: 9 mg/dL (ref 6–20)
CALCIUM: 8.8 mg/dL — AB (ref 8.9–10.3)
CO2: 19 mmol/L — ABNORMAL LOW (ref 22–32)
CREATININE: 1.16 mg/dL (ref 0.61–1.24)
Chloride: 115 mmol/L — ABNORMAL HIGH (ref 101–111)
GFR calc Af Amer: >60 mL/min (ref >60)
Glucose, Bld: 118 mg/dL — ABNORMAL HIGH (ref 65–99)
POTASSIUM: 3.8 mmol/L (ref 3.5–5.1)
SODIUM: 142 mmol/L (ref 135–145)

## 2015-11-10 LAB — CBC WITH DIFFERENTIAL/PLATELET
Basophils Absolute: 0 10*3/uL (ref 0.0–0.1)
Basophils Relative: 0 %
EOS ABS: 0.4 10*3/uL (ref 0.0–0.7)
EOS PCT: 6 %
HCT: 33 % — ABNORMAL LOW (ref 39.0–52.0)
Hemoglobin: 11.9 g/dL — ABNORMAL LOW (ref 13.0–17.0)
LYMPHS ABS: 2.5 10*3/uL (ref 0.7–4.0)
Lymphocytes Relative: 40 %
MCH: 27.4 pg (ref 26.0–34.0)
MCHC: 36.1 g/dL — ABNORMAL HIGH (ref 30.0–36.0)
MCV: 76 fL — ABNORMAL LOW (ref 78.0–100.0)
MONO ABS: 0.5 10*3/uL (ref 0.1–1.0)
Monocytes Relative: 7 %
Neutro Abs: 3 10*3/uL (ref 1.7–7.7)
Neutrophils Relative %: 47 %
PLATELETS: 219 10*3/uL (ref 150–400)
RBC: 4.34 MIL/uL (ref 4.22–5.81)
RDW: 14.6 % (ref 11.5–15.5)
WBC: 6.4 10*3/uL (ref 4.0–10.5)

## 2015-11-11 NOTE — Pre-Procedure Instructions (Signed)
Patient in for PAT. Still on plavix and was not instructed to stop it. Called Neil Crouch, [place] and spoke with her. They want patient to continue plavix.

## 2015-11-14 ENCOUNTER — Ambulatory Visit (HOSPITAL_COMMUNITY): Payer: Medicare Other | Admitting: Anesthesiology

## 2015-11-14 ENCOUNTER — Ambulatory Visit (HOSPITAL_COMMUNITY)
Admission: RE | Admit: 2015-11-14 | Discharge: 2015-11-14 | Disposition: A | Discharge status: Home / Self Care | Payer: Medicare Other | Source: Ambulatory Visit | Attending: Internal Medicine | Admitting: Internal Medicine

## 2015-11-14 ENCOUNTER — Encounter (HOSPITAL_COMMUNITY): Payer: Self-pay | Admitting: *Deleted

## 2015-11-14 ENCOUNTER — Encounter (HOSPITAL_COMMUNITY)
Admission: RE | Disposition: A | Discharge status: Home / Self Care | Payer: Self-pay | Source: Ambulatory Visit | Attending: Internal Medicine

## 2015-11-14 DIAGNOSIS — F319 Bipolar disorder, unspecified: Secondary | ICD-10-CM | POA: Insufficient documentation

## 2015-11-14 DIAGNOSIS — Z794 Long term (current) use of insulin: Secondary | ICD-10-CM | POA: Insufficient documentation

## 2015-11-14 DIAGNOSIS — E119 Type 2 diabetes mellitus without complications: Secondary | ICD-10-CM | POA: Diagnosis not present

## 2015-11-14 DIAGNOSIS — I2510 Atherosclerotic heart disease of native coronary artery without angina pectoris: Secondary | ICD-10-CM | POA: Insufficient documentation

## 2015-11-14 DIAGNOSIS — Z87891 Personal history of nicotine dependence: Secondary | ICD-10-CM | POA: Diagnosis not present

## 2015-11-14 DIAGNOSIS — Z800 Family history of malignant neoplasm of digestive organs: Secondary | ICD-10-CM | POA: Insufficient documentation

## 2015-11-14 DIAGNOSIS — Z7902 Long term (current) use of antithrombotics/antiplatelets: Secondary | ICD-10-CM | POA: Insufficient documentation

## 2015-11-14 DIAGNOSIS — E039 Hypothyroidism, unspecified: Secondary | ICD-10-CM | POA: Diagnosis not present

## 2015-11-14 DIAGNOSIS — Z9049 Acquired absence of other specified parts of digestive tract: Secondary | ICD-10-CM | POA: Diagnosis not present

## 2015-11-14 DIAGNOSIS — G4730 Sleep apnea, unspecified: Secondary | ICD-10-CM | POA: Insufficient documentation

## 2015-11-14 DIAGNOSIS — E782 Mixed hyperlipidemia: Secondary | ICD-10-CM | POA: Diagnosis not present

## 2015-11-14 DIAGNOSIS — F419 Anxiety disorder, unspecified: Secondary | ICD-10-CM | POA: Insufficient documentation

## 2015-11-14 DIAGNOSIS — Z85038 Personal history of other malignant neoplasm of large intestine: Secondary | ICD-10-CM | POA: Diagnosis not present

## 2015-11-14 DIAGNOSIS — Z1211 Encounter for screening for malignant neoplasm of colon: Secondary | ICD-10-CM | POA: Insufficient documentation

## 2015-11-14 DIAGNOSIS — I10 Essential (primary) hypertension: Secondary | ICD-10-CM | POA: Insufficient documentation

## 2015-11-14 DIAGNOSIS — M1991 Primary osteoarthritis, unspecified site: Secondary | ICD-10-CM | POA: Diagnosis not present

## 2015-11-14 DIAGNOSIS — Z79899 Other long term (current) drug therapy: Secondary | ICD-10-CM | POA: Diagnosis not present

## 2015-11-14 HISTORY — PX: COLONOSCOPY WITH PROPOFOL: SHX5780

## 2015-11-14 LAB — GLUCOSE, CAPILLARY
GLUCOSE-CAPILLARY: 89 mg/dL (ref 65–99)
Glucose-Capillary: 81 mg/dL (ref 65–99)
Glucose-Capillary: 71 mg/dL (ref 65–99)
Glucose-Capillary: 61 mg/dL — ABNORMAL LOW (ref 65–99)

## 2015-11-14 SURGERY — COLONOSCOPY WITH PROPOFOL
Anesthesia: Monitor Anesthesia Care

## 2015-11-14 MED ORDER — LACTATED RINGERS IV SOLN
INTRAVENOUS | Status: DC
  Administered 2015-11-14: 12:00:00 via INTRAVENOUS

## 2015-11-14 MED ORDER — LIDOCAINE HCL (PF) 1 % IJ SOLN
INTRAMUSCULAR | Status: AC
  Filled 2015-11-14: qty 5

## 2015-11-14 MED ORDER — FENTANYL CITRATE (PF) 100 MCG/2ML IJ SOLN: 25.0000 ug | INTRAMUSCULAR | Status: DC | PRN

## 2015-11-14 MED ORDER — MIDAZOLAM HCL 2 MG/2ML IJ SOLN
1.0000 mg | INTRAMUSCULAR | Status: DC | PRN
  Administered 2015-11-14: 2 mg via INTRAVENOUS

## 2015-11-14 MED ORDER — MIDAZOLAM HCL 5 MG/5ML IJ SOLN
INTRAMUSCULAR | Status: DC | PRN
  Administered 2015-11-14: 2 mg via INTRAVENOUS

## 2015-11-14 MED ORDER — MIDAZOLAM HCL 2 MG/2ML IJ SOLN
INTRAMUSCULAR | Status: AC
  Filled 2015-11-14: qty 2

## 2015-11-14 MED ORDER — ONDANSETRON HCL 4 MG/2ML IJ SOLN: 4.0000 mg | Freq: Once | INTRAMUSCULAR | Status: DC | PRN

## 2015-11-14 MED ORDER — PROPOFOL 500 MG/50ML IV EMUL
INTRAVENOUS | Status: DC | PRN
  Administered 2015-11-14: 125 ug/kg/min via INTRAVENOUS

## 2015-11-14 NOTE — Discharge Instructions (Signed)
°  Colonoscopy Discharge Instructions  Read the instructions outlined below and refer to this sheet in the next few weeks. These discharge instructions provide you with general information on caring for yourself after you leave the hospital. Your doctor may also give you specific instructions. While your treatment has been planned according to the most current medical practices available, unavoidable complications occasionally occur. If you have any problems or questions after discharge, call [name] at [phone]. ACTIVITY  You may resume your regular activity, but move at a slower pace for the next 24 hours.   Take frequent rest periods for the next 24 hours.   Walking will help get rid of the air and reduce the bloated feeling in your belly (abdomen).   No driving for 24 hours (because of the medicine (anesthesia) used during the test).    Do not sign any important legal documents or operate any machinery for 24 hours (because of the anesthesia used during the test).  NUTRITION  Drink plenty of fluids.   You may resume your normal diet as instructed by your doctor.   Begin with a light meal and progress to your normal diet. Heavy or fried foods are harder to digest and may make you feel sick to your stomach (nauseated).   Avoid alcoholic beverages for 24 hours or as instructed.  MEDICATIONS  You may resume your normal medications unless your doctor tells you otherwise.  WHAT YOU CAN EXPECT TODAY  Some feelings of bloating in the abdomen.   Passage of more gas than usual.   Spotting of blood in your stool or on the toilet paper.  IF YOU HAD POLYPS REMOVED DURING THE COLONOSCOPY:  No aspirin products for 7 days or as instructed.   No alcohol for 7 days or as instructed.   Eat a soft diet for the next 24 hours.  FINDING OUT THE RESULTS OF YOUR TEST Not all test results are available during your visit. If your test results are not back during the visit, make an appointment  with your caregiver to find out the results. Do not assume everything is normal if you have not heard from your caregiver or the medical facility. It is important for you to follow up on all of your test results.  SEEK IMMEDIATE MEDICAL ATTENTION IF:  You have more than a spotting of blood in your stool.   Your belly is swollen (abdominal distention).   You are nauseated or vomiting.   You have a temperature over 101.   You have abdominal pain or discomfort that is severe or gets worse throughout the day.   Repeat colonoscopy in 3 years
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
## 2015-11-14 NOTE — Op Note (Signed)
[facility] Patient Name: Derrick [name] Procedure Date: 11/14/2015 12:53 PM MRN: [id number] Date of Birth: November 05, 1950 Attending MD: Norvel Richards , MD CSN: [id number] Age: 65 Admit Type: Outpatient Procedure:                Colonoscopy ?"surveillance examination Indications:              High risk colon cancer surveillance: Personal                            history of colon cancer; status post extended right                            hemicolectomy Providers:                Norvel Richards, MD, Gwenlyn Fudge, RN, Georgeann Oppenheim, Technician Referring MD:             Loleta Chance, Edsel Petrin. Dalbert Batman, MD Medicines:                Monitored Anesthesia Care Complications:            No immediate complications. Estimated Blood Loss:     Estimated blood loss: none. Procedure:                Pre-Anesthesia Assessment:                           - Prior to the procedure, a History and Physical                            was performed, and patient medications and                            allergies were reviewed. The patient's tolerance of                            previous anesthesia was also reviewed. The risks                            and benefits of the procedure and the sedation                            options and risks were discussed with the patient.                            All questions were answered, and informed consent                            was obtained. Prior Anticoagulants: The patient has                            taken no previous anticoagulant or antiplatelet  agents. ASA Grade Assessment: II - A patient with                            mild systemic disease. After reviewing the risks                            and benefits, the patient was deemed in                            satisfactory condition to undergo the procedure.                           After obtaining informed consent, the colonoscope                             was passed under direct vision. Throughout the                            procedure, the patient's blood pressure, pulse, and                            oxygen saturations were monitored continuously. The                            [id number] scope was introduced through                            the anus and advanced to the the ileocolonic                            anastomosis. The colonoscopy was performed without                            difficulty. The patient tolerated the procedure                            well. The quality of the bowel preparation was                            adequate. Surgical were photographed. Scope In: 1:00:37 PM Scope Out: 1:10:52 PM Scope Withdrawal Time: 0 hours 6 minutes 19 seconds  Total Procedure Duration: 0 hours 10 minutes 15 seconds  Findings:      There was evidence of a prior ileo-colonic anastomosis in the ascending       colon. Estimated blood loss: none. The remainder of the residual colonic       and rectal mucosa appeared normal.      The perianal and digital rectal examinations were normal.      The entire examined colon appeared normal. Impression:               - ileo-colonic anastomosis.                           - The entire examined colon is normal.                           -  No specimens collected. Moderate Sedation:      Moderate (conscious) sedation was personally administered by an       anesthesia professional. The following parameters were monitored: oxygen       saturation, heart rate, blood pressure, respiratory rate, EKG, adequacy       of pulmonary ventilation, and response to care. Total physician       intraservice time was 14 minutes. Recommendation:           - Patient has a contact number available for                            emergencies. The signs and symptoms of potential                            delayed complications were discussed with the                             patient. Return to normal activities tomorrow.                            Written discharge instructions were provided to the                            patient.                           - Advance diet as tolerated.                           - Continue present medications.                           - Repeat colonoscopy in 3 years for surveillance.                           - Return to GI clinic in 3 years. Procedure Code(s):        --- Professional ---                           [phone], Colonoscopy, flexible; diagnostic, including                            collection of specimen(s) by brushing or washing,                            when performed (separate procedure) Diagnosis Code(s):        --- Professional ---                           [id number], Personal history of other malignant                            neoplasm of large intestine                           Z98.0, Intestinal bypass and anastomosis status CPT  copyright 2016 [employer]. All rights reserved. The codes documented in this report are preliminary and upon coder review may  be revised to meet current compliance requirements. Cristopher Estimable. Wynona Duhamel, MD Norvel Richards, MD 11/14/2015 1:22:47 PM This report has been signed electronically. Number of Addenda: 0

## 2015-11-14 NOTE — Anesthesia Preprocedure Evaluation (Signed)
Anesthesia Evaluation  Patient identified by MRN, date of birth, ID band Patient awake    Reviewed: Allergy & Precautions, NPO status , Patient's Chart, lab work & pertinent test results, reviewed documented beta blocker date and time   Airway Mallampati: III       Dental  (+) Edentulous Upper, Edentulous Lower   Pulmonary sleep apnea , pneumonia, resolved, former smoker,    Pulmonary exam normal        Cardiovascular hypertension, Pt. on medications and Pt. on home beta blockers + CAD  Normal cardiovascular exam     Neuro/Psych  Headaches, Anxiety Depression Bipolar Disorder CVA, No Residual Symptoms    GI/Hepatic GERD  Medicated and Controlled,  Endo/Other  diabetes, Poorly Controlled, Type 2Hypothyroidism   Renal/GU      Musculoskeletal  (+) Arthritis , Osteoarthritis,    Abdominal (+) + obese,   Peds  Hematology   Anesthesia Other Findings   Reproductive/Obstetrics                             Anesthesia Physical Anesthesia Plan  ASA: III  Anesthesia Plan: MAC   Post-op Pain Management:    Induction: Intravenous  Airway Management Planned: Nasal Cannula  Additional Equipment:   Intra-op Plan:   Post-operative Plan:   Informed Consent: I have reviewed the patients History and Physical, chart, labs and discussed the procedure including the risks, benefits and alternatives for the proposed anesthesia with the patient or authorized representative who has indicated his/her understanding and acceptance.     Plan Discussed with: CRNA  Anesthesia Plan Comments:         Anesthesia Quick Evaluation

## 2015-11-14 NOTE — Interval H&P Note (Signed)
History and Physical Interval Note:  11/14/2015 12:42 PM  Derrick [name]  has presented today for surgery, with the diagnosis of history of colon cancer  The various methods of treatment have been discussed with the patient and family. After consideration of risks, benefits and other options for treatment, the patient has consented to  Procedure(s) with comments: COLONOSCOPY WITH PROPOFOL (N/A) - 1200 as a surgical intervention .  The patient's history has been reviewed, patient examined, no change in status, stable for surgery.  I have reviewed the patient's chart and labs.  Questions were answered to the patient's satisfaction.     Derrick [name]  No change. Surveillance colonoscopy per plan.  The risks, benefits, limitations, alternatives and imponderables have been reviewed with the patient. Questions have been answered. All parties are agreeable.

## 2015-11-14 NOTE — Transfer of Care (Signed)
Immediate Anesthesia Transfer of Care Note  Patient: Derrick [name]  Procedure(s) Performed: Procedure(s) with comments: COLONOSCOPY WITH PROPOFOL (N/A) - 1200  Patient Location: PACU  Anesthesia Type:MAC  Level of Consciousness: awake and alert   Airway & Oxygen Therapy: Patient Spontanous Breathing  Post-op Assessment: Report given to RN and Post -op Vital signs reviewed and stable  Post vital signs: Reviewed and stable  Last Vitals:  Filed Vitals:   11/14/15 1245 11/14/15 1319  BP: 155/91 116/64  Pulse:  52  Temp:    Resp: 18 12    Last Pain: There were no vitals filed for this visit.    Patients Stated Pain Goal: 9 ([id number])  Complications: No apparent anesthesia complications

## 2015-11-15 NOTE — Anesthesia Postprocedure Evaluation (Signed)
Anesthesia Post Note Late entry  Patient: Derrick [name]  Procedure(s) Performed: Procedure(s) (LRB): COLONOSCOPY WITH PROPOFOL (N/A)  Patient location during evaluation: PACU Anesthesia Type: MAC Level of consciousness: awake and alert and oriented Pain management: pain level controlled Vital Signs Assessment: post-procedure vital signs reviewed and stable Respiratory status: spontaneous breathing and respiratory function stable Cardiovascular status: stable Postop Assessment: no signs of nausea or vomiting Anesthetic complications: no    Last Vitals:  Filed Vitals:   11/14/15 1345 11/14/15 1401  BP: 145/84 144/86  Pulse: 64 56  Temp:  36.4 C  Resp: 19 18    Last Pain: There were no vitals filed for this visit.               ADAMS, AMY A

## 2015-11-16 ENCOUNTER — Encounter (HOSPITAL_COMMUNITY): Payer: Self-pay | Admitting: Internal Medicine

## 2015-11-25 ENCOUNTER — Inpatient Hospital Stay (HOSPITAL_COMMUNITY): Payer: Medicare Other

## 2015-11-25 ENCOUNTER — Inpatient Hospital Stay (HOSPITAL_COMMUNITY)
Admission: EM | Admit: 2015-11-25 | Discharge: 2015-12-01 | DRG: 062 | Disposition: A | Discharge status: Rehabilitation Facility | Payer: Medicare Other | Attending: Neurology | Admitting: Neurology

## 2015-11-25 ENCOUNTER — Encounter (HOSPITAL_COMMUNITY): Payer: Self-pay | Admitting: Emergency Medicine

## 2015-11-25 ENCOUNTER — Emergency Department (HOSPITAL_COMMUNITY): Payer: Medicare Other

## 2015-11-25 DIAGNOSIS — E039 Hypothyroidism, unspecified: Secondary | ICD-10-CM | POA: Diagnosis present

## 2015-11-25 DIAGNOSIS — M5136 Other intervertebral disc degeneration, lumbar region: Secondary | ICD-10-CM | POA: Diagnosis present

## 2015-11-25 DIAGNOSIS — G8194 Hemiplegia, unspecified affecting left nondominant side: Secondary | ICD-10-CM | POA: Diagnosis present

## 2015-11-25 DIAGNOSIS — R29810 Facial weakness: Secondary | ICD-10-CM | POA: Diagnosis present

## 2015-11-25 DIAGNOSIS — Z23 Encounter for immunization: Secondary | ICD-10-CM | POA: Diagnosis not present

## 2015-11-25 DIAGNOSIS — R4781 Slurred speech: Secondary | ICD-10-CM | POA: Diagnosis present

## 2015-11-25 DIAGNOSIS — Z85048 Personal history of other malignant neoplasm of rectum, rectosigmoid junction, and anus: Secondary | ICD-10-CM

## 2015-11-25 DIAGNOSIS — I10 Essential (primary) hypertension: Secondary | ICD-10-CM | POA: Diagnosis present

## 2015-11-25 DIAGNOSIS — I6789 Other cerebrovascular disease: Secondary | ICD-10-CM | POA: Diagnosis not present

## 2015-11-25 DIAGNOSIS — R001 Bradycardia, unspecified: Secondary | ICD-10-CM | POA: Diagnosis present

## 2015-11-25 DIAGNOSIS — G4733 Obstructive sleep apnea (adult) (pediatric): Secondary | ICD-10-CM | POA: Diagnosis present

## 2015-11-25 DIAGNOSIS — G8104 Flaccid hemiplegia affecting left nondominant side: Secondary | ICD-10-CM | POA: Diagnosis not present

## 2015-11-25 DIAGNOSIS — R29707 NIHSS score 7: Secondary | ICD-10-CM | POA: Diagnosis present

## 2015-11-25 DIAGNOSIS — F319 Bipolar disorder, unspecified: Secondary | ICD-10-CM | POA: Diagnosis present

## 2015-11-25 DIAGNOSIS — Z6837 Body mass index (BMI) 37.0-37.9, adult: Secondary | ICD-10-CM | POA: Diagnosis not present

## 2015-11-25 DIAGNOSIS — Z9049 Acquired absence of other specified parts of digestive tract: Secondary | ICD-10-CM | POA: Diagnosis not present

## 2015-11-25 DIAGNOSIS — Z4659 Encounter for fitting and adjustment of other gastrointestinal appliance and device: Secondary | ICD-10-CM

## 2015-11-25 DIAGNOSIS — R490 Dysphonia: Secondary | ICD-10-CM | POA: Diagnosis present

## 2015-11-25 DIAGNOSIS — I6502 Occlusion and stenosis of left vertebral artery: Secondary | ICD-10-CM | POA: Diagnosis present

## 2015-11-25 DIAGNOSIS — R1310 Dysphagia, unspecified: Secondary | ICD-10-CM | POA: Diagnosis present

## 2015-11-25 DIAGNOSIS — I672 Cerebral atherosclerosis: Secondary | ICD-10-CM | POA: Diagnosis present

## 2015-11-25 DIAGNOSIS — I638 Other cerebral infarction: Secondary | ICD-10-CM | POA: Diagnosis not present

## 2015-11-25 DIAGNOSIS — I69351 Hemiplegia and hemiparesis following cerebral infarction affecting right dominant side: Secondary | ICD-10-CM | POA: Diagnosis not present

## 2015-11-25 DIAGNOSIS — I639 Cerebral infarction, unspecified: Secondary | ICD-10-CM | POA: Diagnosis present

## 2015-11-25 DIAGNOSIS — Z794 Long term (current) use of insulin: Secondary | ICD-10-CM

## 2015-11-25 DIAGNOSIS — E669 Obesity, unspecified: Secondary | ICD-10-CM | POA: Diagnosis present

## 2015-11-25 DIAGNOSIS — Z7902 Long term (current) use of antithrombotics/antiplatelets: Secondary | ICD-10-CM | POA: Diagnosis not present

## 2015-11-25 DIAGNOSIS — E782 Mixed hyperlipidemia: Secondary | ICD-10-CM | POA: Diagnosis present

## 2015-11-25 DIAGNOSIS — I63312 Cerebral infarction due to thrombosis of left middle cerebral artery: Secondary | ICD-10-CM

## 2015-11-25 DIAGNOSIS — E1142 Type 2 diabetes mellitus with diabetic polyneuropathy: Secondary | ICD-10-CM | POA: Diagnosis not present

## 2015-11-25 DIAGNOSIS — G464 Cerebellar stroke syndrome: Secondary | ICD-10-CM | POA: Diagnosis not present

## 2015-11-25 DIAGNOSIS — I69393 Ataxia following cerebral infarction: Secondary | ICD-10-CM | POA: Diagnosis not present

## 2015-11-25 DIAGNOSIS — I69354 Hemiplegia and hemiparesis following cerebral infarction affecting left non-dominant side: Secondary | ICD-10-CM | POA: Diagnosis not present

## 2015-11-25 DIAGNOSIS — E119 Type 2 diabetes mellitus without complications: Secondary | ICD-10-CM | POA: Diagnosis present

## 2015-11-25 DIAGNOSIS — Z8249 Family history of ischemic heart disease and other diseases of the circulatory system: Secondary | ICD-10-CM | POA: Diagnosis not present

## 2015-11-25 DIAGNOSIS — I63212 Cerebral infarction due to unspecified occlusion or stenosis of left vertebral arteries: Secondary | ICD-10-CM | POA: Diagnosis not present

## 2015-11-25 DIAGNOSIS — F17290 Nicotine dependence, other tobacco product, uncomplicated: Secondary | ICD-10-CM | POA: Diagnosis present

## 2015-11-25 DIAGNOSIS — E1159 Type 2 diabetes mellitus with other circulatory complications: Secondary | ICD-10-CM | POA: Diagnosis not present

## 2015-11-25 DIAGNOSIS — E785 Hyperlipidemia, unspecified: Secondary | ICD-10-CM | POA: Diagnosis not present

## 2015-11-25 DIAGNOSIS — I69391 Dysphagia following cerebral infarction: Secondary | ICD-10-CM | POA: Diagnosis not present

## 2015-11-25 DIAGNOSIS — Z91041 Radiographic dye allergy status: Secondary | ICD-10-CM | POA: Diagnosis not present

## 2015-11-25 DIAGNOSIS — Z833 Family history of diabetes mellitus: Secondary | ICD-10-CM | POA: Diagnosis not present

## 2015-11-25 DIAGNOSIS — R066 Hiccough: Secondary | ICD-10-CM | POA: Diagnosis present

## 2015-11-25 DIAGNOSIS — I2510 Atherosclerotic heart disease of native coronary artery without angina pectoris: Secondary | ICD-10-CM | POA: Diagnosis present

## 2015-11-25 DIAGNOSIS — R471 Dysarthria and anarthria: Secondary | ICD-10-CM | POA: Diagnosis present

## 2015-11-25 DIAGNOSIS — I6302 Cerebral infarction due to thrombosis of basilar artery: Secondary | ICD-10-CM | POA: Diagnosis not present

## 2015-11-25 DIAGNOSIS — I6523 Occlusion and stenosis of bilateral carotid arteries: Secondary | ICD-10-CM | POA: Diagnosis present

## 2015-11-25 DIAGNOSIS — I679 Cerebrovascular disease, unspecified: Secondary | ICD-10-CM | POA: Diagnosis not present

## 2015-11-25 LAB — I-STAT CHEM 8, ED
BUN: 6 mg/dL (ref 6–20)
CALCIUM ION: 1.14 mmol/L (ref 1.13–1.30)
CHLORIDE: 106 mmol/L (ref 101–111)
Creatinine, Ser: 1 mg/dL (ref 0.61–1.24)
Glucose, Bld: 184 mg/dL — ABNORMAL HIGH (ref 65–99)
HEMATOCRIT: 40 % (ref 39.0–52.0)
HEMOGLOBIN: 13.6 g/dL (ref 13.0–17.0)
Potassium: 3.8 mmol/L (ref 3.5–5.1)
SODIUM: 144 mmol/L (ref 135–145)
TCO2: 22 mmol/L (ref 0–100)

## 2015-11-25 LAB — URINALYSIS, ROUTINE W REFLEX MICROSCOPIC
Bilirubin Urine: NEGATIVE
Glucose, UA: NEGATIVE mg/dL
Hgb urine dipstick: NEGATIVE
KETONES UR: NEGATIVE mg/dL
LEUKOCYTES UA: NEGATIVE
NITRITE: NEGATIVE
PH: 5.5 (ref 5.0–8.0)
Protein, ur: NEGATIVE mg/dL
SPECIFIC GRAVITY, URINE: 1.025 (ref 1.005–1.030)

## 2015-11-25 LAB — DIFFERENTIAL
Basophils Absolute: 0 10*3/uL (ref 0.0–0.1)
Basophils Relative: 0 %
EOS PCT: 2 %
Eosinophils Absolute: 0.2 10*3/uL (ref 0.0–0.7)
LYMPHS ABS: 2.6 10*3/uL (ref 0.7–4.0)
Lymphocytes Relative: 35 %
MONO ABS: 0.5 10*3/uL (ref 0.1–1.0)
Monocytes Relative: 6 %
Neutro Abs: 4.2 10*3/uL (ref 1.7–7.7)
Neutrophils Relative %: 57 %

## 2015-11-25 LAB — I-STAT TROPONIN, ED: TROPONIN I, POC: 0 ng/mL (ref 0.00–0.08)

## 2015-11-25 LAB — COMPREHENSIVE METABOLIC PANEL
ALK PHOS: 42 U/L (ref 38–126)
ALT: 12 U/L — ABNORMAL LOW (ref 17–63)
ANION GAP: 6 (ref 5–15)
AST: 15 U/L (ref 15–41)
Albumin: 3.9 g/dL (ref 3.5–5.0)
BILIRUBIN TOTAL: 0.6 mg/dL (ref 0.3–1.2)
BUN: 8 mg/dL (ref 6–20)
CALCIUM: 8.6 mg/dL — AB (ref 8.9–10.3)
CO2: 22 mmol/L (ref 22–32)
Chloride: 109 mmol/L (ref 101–111)
Creatinine, Ser: 1.04 mg/dL (ref 0.61–1.24)
GFR calc non Af Amer: >60 mL/min (ref >60)
Glucose, Bld: 187 mg/dL — ABNORMAL HIGH (ref 65–99)
Potassium: 3.6 mmol/L (ref 3.5–5.1)
SODIUM: 137 mmol/L (ref 135–145)
TOTAL PROTEIN: 7.4 g/dL (ref 6.5–8.1)

## 2015-11-25 LAB — PROTIME-INR
INR: 1.09 (ref 0.00–1.49)
PROTHROMBIN TIME: 14.3 s (ref 11.6–15.2)

## 2015-11-25 LAB — APTT: aPTT: 24 seconds (ref 24–37)

## 2015-11-25 LAB — CBC
HCT: 37.2 % — ABNORMAL LOW (ref 39.0–52.0)
Hemoglobin: 13.1 g/dL (ref 13.0–17.0)
MCH: 27.3 pg (ref 26.0–34.0)
MCHC: 35.2 g/dL (ref 30.0–36.0)
MCV: 77.7 fL — AB (ref 78.0–100.0)
RBC: 4.79 MIL/uL (ref 4.22–5.81)
RDW: 15 % (ref 11.5–15.5)
WBC: 7.5 10*3/uL (ref 4.0–10.5)

## 2015-11-25 LAB — ETHANOL: Alcohol, Ethyl (B): <5 mg/dL (ref <5)

## 2015-11-25 LAB — RAPID URINE DRUG SCREEN, HOSP PERFORMED
Amphetamines: NOT DETECTED
Barbiturates: NOT DETECTED
Benzodiazepines: NOT DETECTED
Cocaine: NOT DETECTED
OPIATES: NOT DETECTED
Tetrahydrocannabinol: NOT DETECTED

## 2015-11-25 LAB — CBG MONITORING, ED: GLUCOSE-CAPILLARY: 177 mg/dL — AB (ref 65–99)

## 2015-11-25 MED ORDER — NICARDIPINE HCL IN NACL 20-0.86 MG/200ML-% IV SOLN
3.0000 mg/h | INTRAVENOUS | Status: DC
  Administered 2015-11-26 – 2015-11-27 (×14): 10 mg/h via INTRAVENOUS
  Administered 2015-11-27: 5 mg/h via INTRAVENOUS
  Administered 2015-11-27 (×4): 10 mg/h via INTRAVENOUS
  Filled 2015-11-25 (×2): qty 200
  Filled 2015-11-25: qty 400
  Filled 2015-11-25 (×17): qty 200

## 2015-11-25 MED ORDER — NICARDIPINE HCL IN NACL 20-0.86 MG/200ML-% IV SOLN
5.0000 mg/h | Freq: Once | INTRAVENOUS | Status: AC
  Administered 2015-11-25 (×2): 5 mg/h via INTRAVENOUS
  Filled 2015-11-25: qty 200

## 2015-11-25 MED ORDER — ACETAMINOPHEN 650 MG RE SUPP: 650.0000 mg | RECTAL | Status: DC | PRN

## 2015-11-25 MED ORDER — ACETAMINOPHEN 325 MG PO TABS: 650.0000 mg | ORAL_TABLET | ORAL | Status: DC | PRN

## 2015-11-25 MED ORDER — ALTEPLASE 100 MG IV SOLR
INTRAVENOUS | Status: AC
  Administered 2015-11-25: 90 mg via INTRAVENOUS
  Filled 2015-11-25: qty 100

## 2015-11-25 MED ORDER — SODIUM CHLORIDE 0.9 % IV SOLN
50.0000 mL | Freq: Once | INTRAVENOUS | Status: AC
  Administered 2015-11-25: 50 mL via INTRAVENOUS

## 2015-11-25 MED ORDER — STROKE: EARLY STAGES OF RECOVERY BOOK
Freq: Once | Status: AC
Start: 2015-11-25 — End: 2015-11-25
  Administered 2015-11-25: 22:00:00
  Filled 2015-11-25: qty 1

## 2015-11-25 MED ORDER — ONDANSETRON HCL 4 MG/2ML IJ SOLN
4.0000 mg | Freq: Once | INTRAMUSCULAR | Status: AC
  Administered 2015-11-25: 4 mg via INTRAVENOUS

## 2015-11-25 MED ORDER — ONDANSETRON HCL 4 MG/2ML IJ SOLN
INTRAMUSCULAR | Status: AC
  Administered 2015-11-25: 4 mg via INTRAVENOUS
  Filled 2015-11-25: qty 2

## 2015-11-25 MED ORDER — SODIUM CHLORIDE 0.9 % IV SOLN
INTRAVENOUS | Status: DC
  Administered 2015-11-26: 75 mL/h via INTRAVENOUS
  Administered 2015-11-27: 18:00:00 via INTRAVENOUS
  Administered 2015-11-28: 1000 mL via INTRAVENOUS

## 2015-11-25 MED ORDER — NICARDIPINE HCL IN NACL 20-0.86 MG/200ML-% IV SOLN
INTRAVENOUS | Status: AC
  Administered 2015-11-25: 10 mg
  Filled 2015-11-25: qty 200

## 2015-11-25 MED ORDER — PANTOPRAZOLE SODIUM 40 MG IV SOLR
40.0000 mg | Freq: Every day | INTRAVENOUS | Status: DC
  Administered 2015-11-26 – 2015-11-27 (×3): 40 mg via INTRAVENOUS
  Filled 2015-11-25 (×3): qty 40

## 2015-11-25 MED ORDER — FENTANYL CITRATE (PF) 100 MCG/2ML IJ SOLN
50.0000 ug | Freq: Once | INTRAMUSCULAR | Status: AC
  Administered 2015-11-25: 50 ug via INTRAVENOUS
  Filled 2015-11-25: qty 2

## 2015-11-25 MED ORDER — IOHEXOL 350 MG/ML SOLN
75.0000 mL | Freq: Once | INTRAVENOUS | Status: AC | PRN
  Administered 2015-11-25: 100 mL via INTRAVENOUS

## 2015-11-25 MED ORDER — ONDANSETRON HCL 4 MG/2ML IJ SOLN
INTRAMUSCULAR | Status: AC
  Filled 2015-11-25: qty 2

## 2015-11-25 MED ORDER — ALTEPLASE (STROKE) FULL DOSE INFUSION
90.0000 mg | Freq: Once | INTRAVENOUS | Status: AC
  Administered 2015-11-25: 90 mg via INTRAVENOUS

## 2015-11-25 MED ORDER — LABETALOL HCL 5 MG/ML IV SOLN: 10.0000 mg | INTRAVENOUS | Status: DC | PRN

## 2015-11-25 MED ORDER — NICARDIPINE HCL IN NACL 20-0.86 MG/200ML-% IV SOLN
INTRAVENOUS | Status: AC
  Administered 2015-11-25: 5 mg/h via INTRAVENOUS
  Filled 2015-11-25: qty 200
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
## 2015-11-25 NOTE — ED Notes (Signed)
Patient transported to CT 

## 2015-11-25 NOTE — Progress Notes (Signed)
CODE STROKE Beeper 1601 Pt on table @ [id number] Pt out of room 1620 [id number] called [facility] radiology spoke with diane/bbj

## 2015-11-25 NOTE — ED Notes (Signed)
Some small improvement in symptoms noted, family agrees. Daughter states voice is "clearer" and she is "able to understand him more".

## 2015-11-25 NOTE — ED Notes (Signed)
Pt c/o nausea when he was moved from our stretcher to [facility]

## 2015-11-25 NOTE — ED Notes (Signed)
Pt reporting a "new" headache on the left side of his head. Pt rates this a 10 intermittently. EDP aware and into assess pt. Received order to call ct and have him made a priority.

## 2015-11-25 NOTE — ED Notes (Signed)
Pt is drifting off to sleep. Pt states he is tired.

## 2015-11-25 NOTE — ED Notes (Signed)
Pt reports nausea, 4mg  of zofran administered per protocol.
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
## 2015-11-25 NOTE — ED Notes (Signed)
Pt sleeping. 

## 2015-11-25 NOTE — ED Notes (Signed)
Teleneurology consult in progess

## 2015-11-25 NOTE — ED Notes (Signed)
Unable to give TPA at this time d/t elevated blood pressure. EDP to place orders for blood pressure medication and requests pt have CT Angio head/neck while administering blood pressure medication.

## 2015-11-25 NOTE — ED Notes (Signed)
Derrick [name] [phone] Derrick [name] [phone]

## 2015-11-25 NOTE — ED Notes (Signed)
Pt asking to be suction. Pt states he feels like he can't spit. Small amount of saliva removed from mouth. Pt states that his mouth feels better.

## 2015-11-25 NOTE — ED Notes (Addendum)
Pt brought in EMS, [place]. Pt reports headache since 900 this am. Pt family reports sudden change in speech,left sided weakness,left facial droop at 1530. EDP at time of arrival at bedside to clear pt for CT.

## 2015-11-25 NOTE — ED Notes (Signed)
Pt currently in CTA, TPA pulled,spiked. 42ml of waste pulled off prior to spiking.   EDP discussed care plan with family, family gave consent for TPA "as long as patient is okay with it."

## 2015-11-26 ENCOUNTER — Inpatient Hospital Stay (HOSPITAL_COMMUNITY): Payer: Medicare Other

## 2015-11-26 ENCOUNTER — Encounter (HOSPITAL_COMMUNITY): Payer: Self-pay

## 2015-11-26 DIAGNOSIS — I6789 Other cerebrovascular disease: Secondary | ICD-10-CM

## 2015-11-26 DIAGNOSIS — I639 Cerebral infarction, unspecified: Principal | ICD-10-CM

## 2015-11-26 LAB — LIPID PANEL
Cholesterol: 108 mg/dL (ref 0–200)
HDL: 45 mg/dL (ref >40)
LDL CALC: 59 mg/dL (ref 0–99)
TRIGLYCERIDES: 21 mg/dL (ref <150)
Total CHOL/HDL Ratio: 2.4 RATIO
VLDL: 4 mg/dL (ref 0–40)

## 2015-11-26 LAB — ECHOCARDIOGRAM COMPLETE
HEIGHTINCHES: 68 in
WEIGHTICAEL: 3901.26 [oz_av]

## 2015-11-26 LAB — MRSA PCR SCREENING: MRSA by PCR: NEGATIVE

## 2015-11-26 MED ORDER — ASPIRIN 300 MG RE SUPP
300.0000 mg | Freq: Every day | RECTAL | Status: DC
  Administered 2015-11-26 – 2015-11-28 (×3): 300 mg via RECTAL
  Filled 2015-11-26 (×3): qty 1

## 2015-11-26 NOTE — Progress Notes (Signed)
VASCULAR LAB    Patient had negative CTA of the neck. Please advise if carotid duplex still noted.   Thank you,     Tomy Khim, RVT 11/26/2015, 7:08 AM

## 2015-11-26 NOTE — Progress Notes (Signed)
PT Cancellation Note  Patient Details Name: NURIEL DIMUZIO MRN: [id number] DOB: Dec 02, 1950   Cancelled Treatment:    Reason Eval/Treat Not Completed: Patient not medically ready.  Pt on strict bedrest post-tpa.  Please advance activity orders once appropriate for PT and mobility.     Catarina Hartshorn, [place] 11/26/2015, 8:07 AM

## 2015-11-26 NOTE — Progress Notes (Signed)
STROKE TEAM PROGRESS NOTE   HISTORY OF PRESENT ILLNESS Derrick [name] is an 65 y.o. male history of diabetes mellitus, hypertension, hyperlipidemia, previous stroke and coronary artery disease, brought to the emergency room at [facility] following acute onset of left facial droop and slurred speech as well as weakness of left extremities at 3:30 PM today. Patient has been experiencing a headache since 9 AM today. CT scan of his head showed no acute intracranial abnormality. He was evaluated by teleneurologist ending to be a candidate for TPA which was given. Movement of left extremities improved. There was equivocal improvement in speech, as well. CT angiogram was of less than ideal quality with poor contrast bolus timing. No large or medium vessel occlusion was visualized. NIH stroke score at the time of this evaluation was 7.  LSN: 3:30 PM on 11/25/2015 tPA Given: Yes Friday 11/25/2015 at 1700 mRankin:    SUBJECTIVE (INTERVAL HISTORY) No family is at bedside. He feels improved but still with speech problems, speech therapy at bedside.   OBJECTIVE Temp:  [97.3 F (36.3 C)-98.1 F (36.7 C)] 97.3 F (36.3 C) (05/13 0400) Pulse Rate:  [47-111] 94 (05/13 0800) Cardiac Rhythm:  [-] Normal sinus rhythm (05/12 2130) Resp:  [13-25] 20 (05/13 0800) BP: (137-215)/(78-117) 155/78 mmHg (05/13 0800) SpO2:  [93 %-100 %] 94 % (05/13 0800) Weight:  [110.6 kg (243 lb 13.3 oz)-113.399 kg (250 lb)] 110.6 kg (243 lb 13.3 oz) (05/12 2130)  CBC:  Recent Labs Lab 11/25/15 1610 11/25/15 1617  WBC 7.5  --   NEUTROABS 4.2  --   HGB 13.1 13.6  HCT 37.2* 40.0  MCV 77.7*  --   PLT SPECIMEN CHECKED FOR CLOTS  --     Basic Metabolic Panel:  Recent Labs Lab 11/25/15 1610 11/25/15 1617  NA 137 144  K 3.6 3.8  CL 109 106  CO2 22  --   GLUCOSE 187* 184*  BUN 8 6  CREATININE 1.04 1.00  CALCIUM 8.6*  --     Lipid Panel:    Component Value Date/Time   CHOL 108 11/26/2015 0420   TRIG 21  11/26/2015 0420   HDL 45 11/26/2015 0420   CHOLHDL 2.4 11/26/2015 0420   VLDL 4 11/26/2015 0420   LDLCALC 59 11/26/2015 0420   HgbA1c:  Lab Results  Component Value Date   HGBA1C 7.7 10/31/2015   Urine Drug Screen:    Component Value Date/Time   LABOPIA NONE DETECTED 11/25/2015 1654   COCAINSCRNUR NONE DETECTED 11/25/2015 1654   LABBENZ NONE DETECTED 11/25/2015 1654   AMPHETMU NONE DETECTED 11/25/2015 1654   THCU NONE DETECTED 11/25/2015 1654   LABBARB NONE DETECTED 11/25/2015 1654      IMAGING  Ct Angio Head and Neck W/cm &/or Wo Cm 11/25/2015   Poor contrast bolus timing. I do not think there is any large or medium vessel occlusion that I can visualize. No narrowing at either carotid bifurcation. Extensive atherosclerotic calcification in the carotid siphon regions and supraclinoid internal carotid arteries with potential for flow limiting stenosis in those regions. Calcified plaque affecting the distal vertebral arteries, left worse than right, with potential for flow limiting stenosis in those regions.    Ct Head Wo Contrast 11/25/2015   No acute intracranial abnormality.  No significant change.   Ct Head Wo Contrast 11/25/2015   1.  No evidence for acute intracranial abnormality.  2. Remote fracture of the medial wall of the left orbit.  3. Numerous metallic  retained foreign bodies. One of these is anterior to the left globe.   Physical exam: Exam: Gen: NAD, obese, Alert         CV: RRR, no MRG. No Carotid Bruits. No peripheral edema, warm, nontender Eyes: Conjunctivae clear without exudates or hemorrhage  Neuro: Detailed Neurologic Exam  Speech:    Dysarthric Cognition:    The patient is oriented to person, place, and time; follows commands.  Cranial Nerves:    The pupils are and reactive to light. Attempted fundi could not visualize. Extraocular movements are intact. Trigeminal sensation is intact and the muscles of mastication are normal. Left lower facial  droop.The palate elevates in the midline. Hearing intact. Voice is normal. Shoulder shrug is normal. The tongue has normal motion without fasciculations.   Coordination:  No dysmetria noted  Motor Observation:    No asymmetry, no atrophy, and no involuntary movements noted. Tone:    Normal muscle tone.      Strength: Left leg 4/5 strength otherwise appears intact with 5/5 left arm strength.       Sensation: intact to LT     Reflex Exam:  DTR's:    Deep tendon reflexes in the upper and lower extremities are normal bilaterally.   Toes:    The toes are downgoing bilaterally.   Clonus:    Clonus is absent.       ASSESSMENT/PLAN [name] is a 64 y.o. male with history of  hypertension, hyperlipidemia, coronary artery disease, previous strokes, history of colorectal cancer, obstructive sleep apnea, bipolar disorder, and history of headaches presenting with left-sided weakness and speech difficulties. Patient received tPA Friday 11/25/2015 at 1700.  Stroke:  Non-dominant infarct of uncertain etiology.  Resultant  Dysarthria, lower left facial weakness and left leg weakness, arm appears improved in strength  MRI - Numerous metallic retained foreign bodies noted on CT of the head. May not be able to have MRI, will repeat CT.  MRA - not performed.  Carotid Doppler - refer to CTA of the head and neck.  CTA head and neck - Calcified plaque affecting the distal vertebral arteries, left worse than right, with potential for flow limiting stenosis in those regions. Poor images secondary to contrast timing.  2D Echo - pending  LDL 59  HgbA1c pending  VTE prophylaxis - SCDs  Diet NPO time specified  clopidogrel 75 mg daily prior to admission, now on No antithrombotic secondary to recent TPA therapy. Restart after 24 hours.  Patient counseled to be compliant with his antithrombotic medications  Ongoing aggressive stroke risk factor management  Therapy  recommendations: Pending  Disposition:  Pending  Hypertension  Stable        Permissive hypertension (OK if < 220/120) but gradually normalize in 5-7 days  Hyperlipidemia  Home meds:  Zocor 40 mg daily  LDL 59, goal < 70  Resume Zocor when PO access is available.  Continue statin at discharge  Diabetes  HgbA1c pending, goal < 7.0  Uncontrolled  Other Stroke Risk Factors  Advanced age  Cigarette smoker, quit smoking 18 years ago.  Obesity, Body mass index is 37.08 kg/(m^2).   Hx stroke/TIA  Coronary artery disease  Headache history - question migraines  Obstructive sleep apnea - does not use CPAP anymore. Encouraged to follow up with sleep doctor, untreated OSA is a risk factor for stroke.    Other Active Problems  Numerous metallic retained foreign bodies noted on CT of the head. May not be able  to have MRI of brain. Repeat CT of the head 24 hours after TPA.   PLAN  Status post TPA - start antiplatelet therapy 24 hours after treatment.  Resume statin when PO access is available  Hospital day # 1   Personally examined patient and images, and have participated in and made any corrections needed to history, physical, neuro exam,assessment and plan as stated above.  I have personally obtained the history, evaluated lab date, reviewed imaging studies and agree with radiology interpretations.    Sarina Ill, MD Stroke Neurology [phone] [facility]      To contact Stroke Continuity provider, please refer to [email or web address] After hours, contact General Neurology
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
## 2015-11-26 NOTE — Progress Notes (Signed)
*  PRELIMINARY RESULTS* Echocardiogram 2D Echocardiogram has been performed.  Derrick [name] 11/26/2015, 2:25 PM

## 2015-11-27 DIAGNOSIS — I63212 Cerebral infarction due to unspecified occlusion or stenosis of left vertebral arteries: Secondary | ICD-10-CM

## 2015-11-27 MED ORDER — PNEUMOCOCCAL VAC POLYVALENT 25 MCG/0.5ML IJ INJ
0.5000 mL | INJECTION | INTRAMUSCULAR | Status: AC
  Administered 2015-11-28: 0.5 mL via INTRAMUSCULAR
  Filled 2015-11-27: qty 0.5

## 2015-11-27 NOTE — Progress Notes (Signed)
SLP Cancellation Note  Patient Details Name: Derrick [name] MRN: [id number] DOB: 09-Feb-1951   Cancelled treatment:       Reason Eval/Treat Not Completed: Other (comment). Per RN pt not managing secretions. Will f/u on 5/15 for readiness for objective test.    Micheala Morissette, Katherene Ponto 11/27/2015, 9:17 AM

## 2015-11-27 NOTE — Evaluation (Signed)
Physical Therapy Evaluation Patient Details Name: Derrick [name] MRN: [id number] DOB: Sep 06, 1950 Today's Date: 11/27/2015   History of Present Illness  [place] Regen Sethi is an 65 y.o. male history of diabetes mellitus, hypertension, hyperlipidemia, previous stroke and coronary artery disease admitted with acute onset of left facial droop and slurred speech.  MRI positive for Acute infarction in the lateral medulla on the left consistent with left PICA territory infarction.  Clinical Impression  Patient presents with decreased independence with mobility due to deficits listed in PT problem list.  He will benefit from skilled PT in the acute setting to allow return home following SNF level rehab stay.      Follow Up Recommendations SNF (good rehab candidate; wants to be near home near [facility])    Equipment Recommendations  Rolling walker with 5" wheels    Recommendations for Other Services       Precautions / Restrictions Precautions Precautions: Fall      Mobility  Bed Mobility Overal bed mobility: Needs Assistance Bed Mobility: Supine to Sit     Supine to sit: Min assist     General bed mobility comments: assist for balance and scooting  Transfers Overall transfer level: Needs assistance Equipment used: Rolling walker (2 wheeled) Transfers: Sit to/from Stand Sit to Stand: Min assist         General transfer comment: increased time and cues for hand placement  Ambulation/Gait Ambulation/Gait assistance: Mod assist Ambulation Distance (Feet): 13 Feet Assistive device: Rolling walker (2 wheeled) Gait Pattern/deviations: Step-to pattern;Decreased stride length;Staggering left;Wide base of support;Ataxic     General Gait Details: leaning to L and with increased support on left leans more heavily; difficulty with L foot clearance and initially facilitation for R weight shift to improve L LE clearance; chair was following so when demonstrated worseneing balance brought  chair close for pt to sit  Stairs            Wheelchair Mobility    Modified Rankin (Stroke Patients Only) Modified Rankin (Stroke Patients Only) Pre-Morbid Rankin Score: No significant disability Modified Rankin: Moderately severe disability     Balance Overall balance assessment: Needs assistance   Sitting balance-Leahy Scale: Good Sitting balance - Comments: tends to want to hold rail on L side, but cues to let go and hands in lap able to take min resistance   Standing balance support: Bilateral upper extremity supported Standing balance-Leahy Scale: Poor Standing balance comment: L lateral lean with UE support in standing                             Pertinent Vitals/Pain Pain Assessment: No/denies pain    Home Living Family/patient expects to be discharged to:: Private residence Living Arrangements: Children Available Help at Discharge: Family;Available 24 hours/day Type of Home: Mobile home Home Access: Stairs to enter Entrance Stairs-Rails: Psychiatric nurse of Steps: 10 Home Layout: One level Home Equipment: Cane - single point      Prior Function Level of Independence: Independent;Independent with assistive device(s)               Hand Dominance        Extremity/Trunk Assessment   Upper Extremity Assessment: Overall WFL for tasks assessed           Lower Extremity Assessment: Generalized weakness      Cervical / Trunk Assessment: Normal  Communication   Communication: Expressive difficulties (dysarthria)  Cognition Arousal/Alertness: Awake/alert Behavior  During Therapy: WFL for tasks assessed/performed Overall Cognitive Status: Within Functional Limits for tasks assessed                      General Comments      Exercises        Assessment/Plan    PT Assessment Patient needs continued PT services  PT Diagnosis Abnormality of gait;Generalized weakness   PT Problem List Decreased  strength;Decreased activity tolerance;Decreased balance;Decreased mobility;Decreased safety awareness;Decreased coordination;Impaired sensation  PT Treatment Interventions DME instruction;Gait training;Balance training;Stair training;Functional mobility training;Patient/family education;Therapeutic activities;Therapeutic exercise   PT Goals (Current goals can be found in the Care Plan section) Acute Rehab PT Goals Patient Stated Goal: to go to rehab near [facility] PT Goal Formulation: With patient Time For Goal Achievement: 12/04/15 Potential to Achieve Goals: Good    Frequency Min 4X/week   Barriers to discharge        Co-evaluation               End of Session Equipment Utilized During Treatment: Gait belt Activity Tolerance: Patient limited by fatigue Patient left: with call bell/phone within reach;in chair Nurse Communication: Mobility status         Time: 1440-1505 PT Time Calculation (min) (ACUTE ONLY): 25 min   Charges:   PT Evaluation $PT Eval High Complexity: 1 Procedure PT Treatments $Gait Training: 8-22 mins   PT G CodesReginia Naas 2015/12/22, 3:17 PM  Magda Kiel, [place] 2015/12/22

## 2015-11-27 NOTE — Progress Notes (Signed)
Utilization review completed.  

## 2015-11-27 NOTE — Progress Notes (Addendum)
STROKE TEAM PROGRESS NOTE   HISTORY OF PRESENT ILLNESS Derrick [name] is an 65 y.o. male history of diabetes mellitus, hypertension, hyperlipidemia, previous stroke and coronary artery disease, brought to the emergency room at [facility] following acute onset of left facial droop and slurred speech as well as weakness of left extremities at 3:30 PM today. Patient has been experiencing a headache since 9 AM today. CT scan of his head showed no acute intracranial abnormality. He was evaluated by teleneurologist ending to be a candidate for TPA which was given. Movement of left extremities improved. There was equivocal improvement in speech, as well. CT angiogram was of less than ideal quality with poor contrast bolus timing. No large or medium vessel occlusion was visualized. NIH stroke score at the time of this evaluation was 7.  LSN: 3:30 PM on 11/25/2015 tPA Given: Yes Friday 11/25/2015 at 1700 mRankin:    SUBJECTIVE (INTERVAL HISTORY) No family is at bedside. He feels improved but still with speech problems, has ha not passed is speech evaluation   OBJECTIVE Temp:  [97.8 F (36.6 C)-98.4 F (36.9 C)] 98 F (36.7 C) (05/14 0800) Pulse Rate:  [57-117] 107 (05/14 0700) Cardiac Rhythm:  [-] Normal sinus rhythm (05/13 2000) Resp:  [16-31] 25 (05/14 0700) BP: (135-173)/(67-94) 155/71 mmHg (05/14 0700) SpO2:  [92 %-100 %] 100 % (05/14 0700)  CBC:   Recent Labs Lab 11/25/15 1610 11/25/15 1617  WBC 7.5  --   NEUTROABS 4.2  --   HGB 13.1 13.6  HCT 37.2* 40.0  MCV 77.7*  --   PLT SPECIMEN CHECKED FOR CLOTS  --     Basic Metabolic Panel:   Recent Labs Lab 11/25/15 1610 11/25/15 1617  NA 137 144  K 3.6 3.8  CL 109 106  CO2 22  --   GLUCOSE 187* 184*  BUN 8 6  CREATININE 1.04 1.00  CALCIUM 8.6*  --     Lipid Panel:     Component Value Date/Time   CHOL 108 11/26/2015 0420   TRIG 21 11/26/2015 0420   HDL 45 11/26/2015 0420   CHOLHDL 2.4 11/26/2015 0420    VLDL 4 11/26/2015 0420   LDLCALC 59 11/26/2015 0420   HgbA1c:  Lab Results  Component Value Date   HGBA1C 7.7 10/31/2015   Urine Drug Screen:     Component Value Date/Time   LABOPIA NONE DETECTED 11/25/2015 1654   COCAINSCRNUR NONE DETECTED 11/25/2015 1654   LABBENZ NONE DETECTED 11/25/2015 1654   AMPHETMU NONE DETECTED 11/25/2015 1654   THCU NONE DETECTED 11/25/2015 1654   LABBARB NONE DETECTED 11/25/2015 1654      IMAGING  Ct Angio Head and Neck W/cm &/or Wo Cm 11/25/2015   Poor contrast bolus timing. I do not think there is any large or medium vessel occlusion that I can visualize. No narrowing at either carotid bifurcation. Extensive atherosclerotic calcification in the carotid siphon regions and supraclinoid internal carotid arteries with potential for flow limiting stenosis in those regions. Calcified plaque affecting the distal vertebral arteries, left worse than right, with potential for flow limiting stenosis in those regions.    Ct Head Wo Contrast 11/25/2015   No acute intracranial abnormality.  No significant change.   Ct Head Wo Contrast 11/25/2015   1.  No evidence for acute intracranial abnormality.  2. Remote fracture of the medial wall of the left orbit.  3. Numerous metallic retained foreign bodies. One of these is anterior to the left globe.  Physical exam: Exam: Gen: NAD, obese, Alert         CV: RRR, no MRG. No Carotid Bruits. No peripheral edema, warm, nontender Eyes: Conjunctivae clear without exudates or hemorrhage  Neuro: Detailed Neurologic Exam  Speech:    Dysarthric Cognition:    The patient is oriented to person, place, and time; follows commands.  Cranial Nerves:    The pupils are and reactive to light. Attempted fundi could not visualize. Extraocular movements are intact. Trigeminal sensation is intact and the muscles of mastication are normal. Left lower facial droop.The palate elevates in the midline. Hearing intact. Voice is  normal. Shoulder shrug is normal. The tongue has normal motion without fasciculations.   Coordination:  No dysmetria noted  Motor Observation:    No asymmetry, no atrophy, and no involuntary movements noted. Tone:    Normal muscle tone.      Strength: Left leg 4/5 strength otherwise appears intact with 5/5 left arm strength.       Sensation: intact to LT     Reflex Exam:  DTR's:    Deep tendon reflexes in the upper and lower extremities are normal bilaterally.   Toes:    The toes are downgoing bilaterally.   Clonus:    Clonus is absent.       ASSESSMENT/PLAN [name] is a 65 y.o. male with history of  hypertension, hyperlipidemia, coronary artery disease, previous strokes, history of colorectal cancer, obstructive sleep apnea, bipolar disorder, and history of headaches presenting with left-sided weakness and speech difficulties. Patient received tPA Friday 11/25/2015 at 1700.  Stroke:  Non-dominant infarct due to atherosclerosis  Resultant  Dysarthria, lower left facial weakness and left leg weakness, arm appears improved in strength  MRI - Numerous metallic retained foreign bodies noted on CT of the head. May not be able to have MRI, will repeat CT.  MRA - not performed.  Carotid Doppler - refer to CTA of the head and neck.  CTA head and neck - Calcified plaque affecting the distal vertebral arteries, left worse than right, with potential for flow limiting stenosis in those regions. Poor images secondary to contrast timing.  2D Echo - EF 65-70%. Mildly dilated left atrium. No cardiac source of emboli identified.  LDL 59  HgbA1c pending  VTE prophylaxis - SCDs Diet NPO time specified  clopidogrel 75 mg daily prior to admission, now on rectal ASA due to NPO, at discharge  asa and plavix if possible.  Patient counseled to be compliant with his antithrombotic medications  Ongoing aggressive stroke risk factor management  Therapy recommendations:  Pending  Disposition:  Pending  Hypertension  Stable        Permissive hypertension (OK if < 220/120) but gradually normalize in 5-7 days  Hyperlipidemia  Home meds:  Zocor 40 mg daily  LDL 59, goal < 70  Resume Zocor when PO access is available.  Continue statin at discharge  Diabetes  HgbA1c pending, goal < 7.0  Uncontrolled  Other Stroke Risk Factors  Advanced age  Cigarette smoker, quit smoking 18 years ago.  Obesity, Body mass index is 37.08 kg/(m^2).   Hx stroke/TIA  Coronary artery disease  Headache history - question migraines  Obstructive sleep apnea - does not use CPAP anymore. Encouraged to follow up with sleep doctor, untreated OSA is a risk factor for stroke.    Other Active Problems  Numerous metallic retained foreign bodies noted on CT of the head. May not be able to have  MRI of brain. Repeat CT of the head 24 hours after TPA.   PLAN  Status post TPA - start antiplatelet therapy 24 hours after treatment.  Resume statin when PO access is available  Hospital day # 2   Personally examined patient and images, and have participated in and made any corrections needed to history, physical, neuro exam,assessment and plan as stated above.  I have personally obtained the history, evaluated lab date, reviewed imaging studies and agree with radiology interpretations.         To contact Stroke Continuity provider, please refer to [email or web address] After hours, contact General Neurology

## 2015-11-28 ENCOUNTER — Inpatient Hospital Stay (HOSPITAL_COMMUNITY): Payer: Medicare Other

## 2015-11-28 DIAGNOSIS — E785 Hyperlipidemia, unspecified: Secondary | ICD-10-CM

## 2015-11-28 DIAGNOSIS — I10 Essential (primary) hypertension: Secondary | ICD-10-CM

## 2015-11-28 DIAGNOSIS — I639 Cerebral infarction, unspecified: Secondary | ICD-10-CM | POA: Diagnosis not present

## 2015-11-28 DIAGNOSIS — I6302 Cerebral infarction due to thrombosis of basilar artery: Secondary | ICD-10-CM

## 2015-11-28 DIAGNOSIS — I679 Cerebrovascular disease, unspecified: Secondary | ICD-10-CM

## 2015-11-28 DIAGNOSIS — E1159 Type 2 diabetes mellitus with other circulatory complications: Secondary | ICD-10-CM

## 2015-11-28 LAB — GLUCOSE, CAPILLARY
GLUCOSE-CAPILLARY: 100 mg/dL — AB (ref 65–99)
Glucose-Capillary: 154 mg/dL — ABNORMAL HIGH (ref 65–99)

## 2015-11-28 LAB — HEMOGLOBIN A1C
HEMOGLOBIN A1C: 6.1 % — AB (ref 4.8–5.6)
MEAN PLASMA GLUCOSE: 128 mg/dL

## 2015-11-28 MED ORDER — LEVOTHYROXINE SODIUM 50 MCG PO TABS
50.0000 ug | ORAL_TABLET | Freq: Every day | ORAL | Status: DC
  Administered 2015-11-29 – 2015-12-01 (×3): 50 ug via ORAL
  Filled 2015-11-28 (×3): qty 1

## 2015-11-28 MED ORDER — METOPROLOL TARTRATE 12.5 MG HALF TABLET
12.5000 mg | ORAL_TABLET | Freq: Two times a day (BID) | ORAL | Status: DC
  Administered 2015-11-28 – 2015-11-29 (×3): 12.5 mg via ORAL
  Filled 2015-11-28 (×3): qty 1

## 2015-11-28 MED ORDER — LEVOTHYROXINE SODIUM 100 MCG IV SOLR: 25.0000 ug | Freq: Every day | INTRAVENOUS | Status: DC

## 2015-11-28 MED ORDER — ASPIRIN 325 MG PO TABS
325.0000 mg | ORAL_TABLET | Freq: Every day | ORAL | Status: DC
  Administered 2015-11-29 – 2015-12-01 (×3): 325 mg via ORAL
  Filled 2015-11-28 (×3): qty 1

## 2015-11-28 MED ORDER — VITAL HIGH PROTEIN PO LIQD
1000.0000 mL | ORAL | Status: DC
  Administered 2015-11-28: 1000 mL
  Filled 2015-11-28 (×2): qty 1000

## 2015-11-28 MED ORDER — ENOXAPARIN SODIUM 40 MG/0.4ML ~~LOC~~ SOLN
40.0000 mg | SUBCUTANEOUS | Status: DC
  Administered 2015-11-28 – 2015-11-30 (×3): 40 mg via SUBCUTANEOUS
  Filled 2015-11-28 (×3): qty 0.4

## 2015-11-28 MED ORDER — METFORMIN HCL 500 MG PO TABS
1000.0000 mg | ORAL_TABLET | Freq: Two times a day (BID) | ORAL | Status: DC
  Administered 2015-11-29 – 2015-12-01 (×6): 1000 mg via ORAL
  Filled 2015-11-28 (×6): qty 2

## 2015-11-28 MED ORDER — CLOPIDOGREL BISULFATE 75 MG PO TABS
75.0000 mg | ORAL_TABLET | Freq: Every day | ORAL | Status: DC
Start: 2015-11-28 — End: 2015-12-01
  Administered 2015-11-28 – 2015-12-01 (×4): 75 mg via ORAL
  Filled 2015-11-28 (×4): qty 1

## 2015-11-28 MED ORDER — METOPROLOL TARTRATE 25 MG PO TABS: 25.0000 mg | ORAL_TABLET | Freq: Two times a day (BID) | ORAL | Status: DC

## 2015-11-28 MED ORDER — INSULIN ASPART 100 UNIT/ML ~~LOC~~ SOLN
0.0000 [IU] | Freq: Three times a day (TID) | SUBCUTANEOUS | Status: DC
  Administered 2015-11-28: 4 [IU] via SUBCUTANEOUS

## 2015-11-28 MED ORDER — LISINOPRIL 20 MG PO TABS: 20.0000 mg | ORAL_TABLET | Freq: Every day | ORAL | Status: DC

## 2015-11-28 MED ORDER — LINAGLIPTIN 5 MG PO TABS
5.0000 mg | ORAL_TABLET | Freq: Every day | ORAL | Status: DC
  Administered 2015-11-28 – 2015-12-01 (×4): 5 mg via ORAL
  Filled 2015-11-28 (×4): qty 1

## 2015-11-28 MED ORDER — SIMVASTATIN 40 MG PO TABS
40.0000 mg | ORAL_TABLET | Freq: Every day | ORAL | Status: DC
  Administered 2015-11-28 – 2015-11-29 (×2): 40 mg via ORAL
  Filled 2015-11-28 (×2): qty 1

## 2015-11-28 MED ORDER — LISINOPRIL 10 MG PO TABS
10.0000 mg | ORAL_TABLET | Freq: Two times a day (BID) | ORAL | Status: DC
  Administered 2015-11-28 – 2015-11-29 (×3): 10 mg via ORAL
  Filled 2015-11-28 (×3): qty 1

## 2015-11-28 NOTE — Progress Notes (Signed)
STROKE TEAM PROGRESS NOTE   HISTORY OF PRESENT ILLNESS Derrick [name] is an 65 y.o. male history of diabetes mellitus, hypertension, hyperlipidemia, previous stroke and coronary artery disease, brought to the emergency room at [facility] following acute onset of left facial droop and slurred speech as well as weakness of left extremities at 3:30 PM today. Patient has been experiencing a headache since 9 AM today. CT scan of his head showed no acute intracranial abnormality. He was evaluated by teleneurologist ending to be a candidate for TPA which was given. Movement of left extremities improved. There was equivocal improvement in speech, as well. CT angiogram was of less than ideal quality with poor contrast bolus timing. No large or medium vessel occlusion was visualized. NIH stroke score at the time of this evaluation was 7.  LSN: 3:30 PM on 11/25/2015 tPA Given: Yes Friday 11/25/2015 at 1700 mRankin:   SUBJECTIVE (INTERVAL HISTORY) No family is at bedside. He feels improved but still has left facial droop and mild left LE weakness. Pending speech evaluation. Off cardene but BP still on the high side.   OBJECTIVE Temp:  [98.4 F (36.9 C)-98.9 F (37.2 C)] 98.6 F (37 C) (05/15 0800) Pulse Rate:  [53-119] 119 (05/15 0700) Cardiac Rhythm:  [-] Sinus tachycardia (05/14 2000) Resp:  [17-29] 21 (05/15 0700) BP: (141-181)/(70-113) 171/98 mmHg (05/15 0700) SpO2:  [94 %-100 %] 95 % (05/15 0700)  CBC:   Recent Labs Lab 11/25/15 1610 11/25/15 1617  WBC 7.5  --   NEUTROABS 4.2  --   HGB 13.1 13.6  HCT 37.2* 40.0  MCV 77.7*  --   PLT SPECIMEN CHECKED FOR CLOTS  --     Basic Metabolic Panel:   Recent Labs Lab 11/25/15 1610 11/25/15 1617  NA 137 144  K 3.6 3.8  CL 109 106  CO2 22  --   GLUCOSE 187* 184*  BUN 8 6  CREATININE 1.04 1.00  CALCIUM 8.6*  --     Lipid Panel:     Component Value Date/Time   CHOL 108 11/26/2015 0420   TRIG 21 11/26/2015 0420   HDL 45  11/26/2015 0420   CHOLHDL 2.4 11/26/2015 0420   VLDL 4 11/26/2015 0420   LDLCALC 59 11/26/2015 0420   HgbA1c:  Lab Results  Component Value Date   HGBA1C 7.7 10/31/2015   Urine Drug Screen:     Component Value Date/Time   LABOPIA NONE DETECTED 11/25/2015 1654   COCAINSCRNUR NONE DETECTED 11/25/2015 1654   LABBENZ NONE DETECTED 11/25/2015 1654   AMPHETMU NONE DETECTED 11/25/2015 1654   THCU NONE DETECTED 11/25/2015 1654   LABBARB NONE DETECTED 11/25/2015 1654      IMAGING  Ct Angio Head and Neck W/cm &/or Wo Cm 11/25/2015   Poor contrast bolus timing. I do not think there is any large or medium vessel occlusion that I can visualize. No narrowing at either carotid bifurcation. Extensive atherosclerotic calcification in the carotid siphon regions and supraclinoid internal carotid arteries with potential for flow limiting stenosis in those regions. Calcified plaque affecting the distal vertebral arteries, left worse than right, with potential for flow limiting stenosis in those regions.   Ct Head Wo Contrast 11/25/2015   No acute intracranial abnormality.  No significant change.   Ct Head Wo Contrast 11/25/2015   1.  No evidence for acute intracranial abnormality.  2. Remote fracture of the medial wall of the left orbit.  3. Numerous metallic retained foreign bodies. One  of these is anterior to the left globe.   Mr Brain Wo Contrast  11/26/2015  IMPRESSION: Acute infarction in the lateral medulla on the left consistent with left PICA territory infarction. No other acute infarction. Mild chronic small-vessel disease of the cerebral hemispheric white matter. Extensive artifact related to metallic foreign objects in the superficial soft tissues appear   TTE - - Left ventricle: The cavity size was normal. There was moderate  concentric hypertrophy. Systolic function was vigorous. The  estimated ejection fraction was in the range of 65% to 70%. Wall  motion was normal; there were  no regional wall motion  abnormalities. Doppler parameters are consistent with abnormal  left ventricular relaxation (grade 1 diastolic dysfunction). - Aortic valve: Valve area (Vmax): 3.78 cm^2. - Left atrium: The atrium was mildly dilated.   Physical exam:  Temp:  [98.4 F (36.9 C)-98.9 F (37.2 C)] 98.6 F (37 C) (05/15 0800) Pulse Rate:  [53-119] 119 (05/15 0700) Resp:  [17-29] 21 (05/15 0700) BP: (141-181)/(70-113) 171/98 mmHg (05/15 0700) SpO2:  [94 %-100 %] 95 % (05/15 0700)  General - Well nourished, well developed, in no apparent distress.  Ophthalmologic - Fundi not visualized due to noncooperation.  Cardiovascular - irregular rhythm and tachycardia, on tele consistent with MAT.  Mental Status -  Level of arousal and orientation to time, place, and person were intact. Language including expression, naming, repetition, comprehension was assessed and found intact. Fund of Knowledge was assessed and was intact.  Cranial Nerves II - XII - II - Visual field intact OU. III, IV, VI - Extraocular movements intact, left mild ptosis, PERRL. V - Facial sensation intact bilaterally. VII - left facial droop, left eye closing mildly weaker than right. VIII - Hearing & vestibular intact bilaterally. X - Palate elevates symmetrically. XI - Chin turning & shoulder shrug intact bilaterally. XII - Tongue protrusion intact.  Motor Strength - The patient's strength was normal in all extremities except LLE 4+/5 proximal and distal and pronator drift was absent.  Bulk was normal and fasciculations were absent.   Motor Tone - Muscle tone was assessed at the neck and appendages and was normal.  Reflexes - The patient's reflexes were 1+ in all extremities and he had no pathological reflexes.  Sensory - Light touch, temperature/pinprick, vibration and proprioception, and Romberg testing were assessed and were symmetrical.    Coordination - The patient had subtle dysmetria on the left  FTN.  Tremor was absent.  Gait and Station - deferred due to safety concerns.   ASSESSMENT/PLAN Derrick [name] is a 65 y.o. male with history of  hypertension, hyperlipidemia, coronary artery disease, previous strokes, history of colorectal cancer, obstructive sleep apnea, bipolar disorder, and history of headaches presenting with left-sided weakness and speech difficulties. Patient received tPA Friday 11/25/2015 at 1700.  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  Resultant  Dysarthria, lower left facial weakness and left sided weakness  MRI - left lateral medullary infarct  CTA head and neck - b/l PCOM and small BA/VAs. Left VA distal calcified plaque and stenosis. Bilateral ICA siphon Calcified plaque and stenosis  2D Echo EF 65-70%  LDL 59  HgbA1c 6.1  VTE prophylaxis - SCDs and lovenox subq Diet NPO time specified  clopidogrel 75 mg daily prior to admission, now on DAPT due to intracranial stenosis. Continue DAPT for 3 months, and then plavix alone.   Patient counseled to be compliant with his antithrombotic medications  Ongoing  aggressive stroke risk factor management  Therapy recommendations: Pending  Disposition:  Pending  Dysphagia  Did not pass swallowing  NG tube with tube feeding  Speech following  Hypertension  Stable       Permissive hypertension (OK if < 220/120) but gradually normalize in 5-7 days  BP goal 130-150 due to intracranial stenosis  On half dose of home meds - lisinopril 10mg  bid and metoprolol 12.5mg  bid  Hyperlipidemia  Home meds:  Zocor 40 mg daily  LDL 59, goal < 70  Resume Zocor   Continue statin at discharge  Diabetes  HgbA1c 6.1, goal < 7.0  Controlled  On SSI  Put back on po meds  Other Stroke Risk Factors  Advanced age  Cigarette smoker, quit smoking 18 years ago.  Obesity, Body mass index is 37.08 kg/(m^2).   Hx stroke/TIA - minimal right  side weakness at  baseline  Coronary artery disease s/p PCI  Obstructive sleep apnea - does not use CPAP anymore. Encouraged to follow up with sleep doctor, untreated OSA is a risk factor for stroke.  Other Active Problems     Hospital day # 3  This patient is critically ill due to brainstem infarct s/p tPA and at significant risk of neurological worsening, death form recurrent stroke and hemorrhagic infarct. This patient's care requires constant monitoring of vital signs, hemodynamics, respiratory and cardiac monitoring, review of multiple databases, neurological assessment, discussion with family, other specialists and medical decision making of high complexity. I spent 35 minutes of neurocritical care time in the care of this patient.   Rosalin Hawking, MD PhD Stroke Neurology 11/28/2015 6:55 PM     To contact Stroke Continuity provider, please refer to [email or web address] After hours, contact General Neurology

## 2015-11-28 NOTE — Progress Notes (Signed)
Brief Nutrition Note  Consult received for enteral/tube feeding initiation and management.  Adult Enteral Nutrition Protocol initiated. Full assessment to follow.  Admitting Dx: Cerebral infarction due to unspecified mechanism [I63.9]  Body mass index is 37.08 kg/(m^2). Pt meets criteria for obesity class II based on current BMI.  Labs:   Recent Labs Lab 11/25/15 1610 11/25/15 1617  NA 137 144  K 3.6 3.8  CL 109 106  CO2 22  --   BUN 8 6  CREATININE 1.04 1.00  CALCIUM 8.6*  --   GLUCOSE 187* [address], LDN, CNSC [phone] Pager [phone] After Hours Pager

## 2015-11-28 NOTE — Progress Notes (Signed)
OT Cancellation Note  Patient Details Name: Derrick [name] MRN: [id number] DOB: 09/21/1950   Cancelled Treatment:    Reason Eval/Treat Not Completed: Patient at procedure or test/ unavailable (off unit at this time)  Vonita Moss   OTR/L Pager: [phone] Office: [phone] .  11/28/2015, 1:13 PM

## 2015-11-28 NOTE — Progress Notes (Signed)
Report called and given to [facility], RN

## 2015-11-28 NOTE — Progress Notes (Signed)
  MBSS complete. Full report located under chart review in imaging section.  Recommend: continue NPO, temporary alternative means; trials at bedside with ST   Houston Siren M.Ed [employer] [phone]

## 2015-11-28 NOTE — Progress Notes (Signed)
Speech Language Pathology Treatment: Dysphagia  Patient Details Name: Derrick [name] MRN: [id number] DOB: 1951-07-13 Today's Date: 11/28/2015 Time: [id number] SLP Time Calculation (min) (ACUTE ONLY): 10 min  Assessment / Plan / Recommendation Clinical Impression  Alert, managing secretions. Hoarse vocal quality not present 5/13. Multiple swallows and delayed cough following honey thick liquids therefore recommend objective assessment with MBS scheduled for 1300 today.    HPI HPI: 65 y.o. male history of diabetes mellitus, hypertension, hyperlipidemia, bipolar, previous stroke (90's) and coronary artery disease admitted iwth acute onset left sided weakness. CT scan showed no acute intracranial abnormality, given TPA (may be unable to have MRI, ? metal in head). No recent CXR and no ST notes.       SLP Plan  MBS     Recommendations  Diet recommendations: NPO             Oral Care Recommendations: Oral care QID Follow up Recommendations:  (TBD) Plan: MBS     GO                Derrick [name] 11/28/2015, 9:38 AM  Derrick [name].Ed [employer] [phone]

## 2015-11-29 DIAGNOSIS — R1310 Dysphagia, unspecified: Secondary | ICD-10-CM

## 2015-11-29 DIAGNOSIS — I638 Other cerebral infarction: Secondary | ICD-10-CM

## 2015-11-29 DIAGNOSIS — R066 Hiccough: Secondary | ICD-10-CM

## 2015-11-29 LAB — GLUCOSE, CAPILLARY
GLUCOSE-CAPILLARY: 110 mg/dL — AB (ref 65–99)
GLUCOSE-CAPILLARY: 131 mg/dL — AB (ref 65–99)
GLUCOSE-CAPILLARY: 142 mg/dL — AB (ref 65–99)
Glucose-Capillary: 140 mg/dL — ABNORMAL HIGH (ref 65–99)
Glucose-Capillary: 160 mg/dL — ABNORMAL HIGH (ref 65–99)
Glucose-Capillary: 163 mg/dL — ABNORMAL HIGH (ref 65–99)
Glucose-Capillary: 175 mg/dL — ABNORMAL HIGH (ref 65–99)

## 2015-11-29 MED ORDER — METOPROLOL TARTRATE 25 MG PO TABS
25.0000 mg | ORAL_TABLET | Freq: Two times a day (BID) | ORAL | Status: DC
  Administered 2015-11-30 – 2015-12-01 (×3): 25 mg via ORAL
  Filled 2015-11-29 (×3): qty 1

## 2015-11-29 MED ORDER — LABETALOL HCL 5 MG/ML IV SOLN: 10.0000 mg | INTRAVENOUS | Status: DC | PRN

## 2015-11-29 MED ORDER — JEVITY 1.2 CAL PO LIQD
1000.0000 mL | ORAL | Status: DC
  Administered 2015-11-29 – 2015-11-30 (×2): 1000 mL
  Filled 2015-11-29 (×3): qty 1000
  Filled 2015-11-29: qty 237
  Filled 2015-11-29 (×3): qty 1000

## 2015-11-29 MED ORDER — INSULIN ASPART 100 UNIT/ML ~~LOC~~ SOLN
0.0000 [IU] | Freq: Three times a day (TID) | SUBCUTANEOUS | Status: DC
  Administered 2015-11-29 (×2): 3 [IU] via SUBCUTANEOUS
  Administered 2015-11-29: 4 [IU] via SUBCUTANEOUS
  Administered 2015-11-30: 3 [IU] via SUBCUTANEOUS
  Administered 2015-11-30 – 2015-12-01 (×5): 4 [IU] via SUBCUTANEOUS

## 2015-11-29 MED ORDER — LISINOPRIL 20 MG PO TABS
20.0000 mg | ORAL_TABLET | Freq: Two times a day (BID) | ORAL | Status: DC
Start: 2015-11-30 — End: 2015-12-01
  Administered 2015-11-30 – 2015-12-01 (×3): 20 mg via ORAL
  Filled 2015-11-29 (×3): qty 1

## 2015-11-29 MED ORDER — BACLOFEN 10 MG PO TABS
5.0000 mg | ORAL_TABLET | Freq: Three times a day (TID) | ORAL | Status: DC
  Administered 2015-11-29 – 2015-11-30 (×4): 5 mg via ORAL
  Filled 2015-11-29 (×4): qty 1

## 2015-11-29 NOTE — Progress Notes (Addendum)
Initial Nutrition Assessment  DOCUMENTATION CODES:   Obesity unspecified  INTERVENTION:  Initiate Jevity 1.2 @ 50 ml/hr via NGT and increase by 10 ml every 4 hours to goal rate of 70 ml/hr.   Tube feeding regimen provides 2016 kcal (100% of needs), 93 grams of protein, and 1361 ml of H2O.   Recommend providing 130 ml free water flushes every 3 hours to provide 1040 ml. TF plus FWF's will provide 2401 ml.    NUTRITION DIAGNOSIS:   Inadequate oral intake related to inability to eat, dysphagia as evidenced by NPO status.   GOAL:   Patient will meet greater than or equal to 90% of their needs   MONITOR:   TF tolerance, Weight trends, Skin, I & O's, Diet advancement, Labs  REASON FOR ASSESSMENT:   Consult Enteral/tube feeding initiation and management  ASSESSMENT:   65 y.o. male history of diabetes mellitus, hypertension, hyperlipidemia, previous stroke and coronary artery disease, brought to the emergency room at [facility] following acute onset of left facial droop and slurred speech as well as weakness of left extremities   Pt is on day 4 of admission and has been NPO. Tube feedings were started at 2000 hr 5/15. Pt receiving Vital High protein via cortrak NGT at 40 ml/hr at time of visit. He denies any abdominal discomfort or nausea. States that he usually weighs 245 lbs. Pt appears well-nourished.   Labs reviewed.   Diet Order:  Diet NPO time specified  Skin:  Reviewed, no issues  Last BM:  5/12  Height:   Ht Readings from Last 1 Encounters:  11/25/15 5\' 8"  (1.727 m)    Weight:   Wt Readings from Last 1 Encounters:  11/29/15 236 lb 3.2 oz (107.14 kg)    Ideal Body Weight:  70 kg  BMI:  Body mass index is 35.92 kg/(m^2).  Estimated Nutritional Needs:   Kcal:  1900-2100  Protein:  95-110 grams  Fluid:  2.4 L/day  EDUCATION NEEDS:   No education needs identified at this time  Brielle, LDN Inpatient Clinical Dietitian Pager:  [phone] After Hours Pager: [phone]

## 2015-11-29 NOTE — Progress Notes (Signed)
I will follow up with pt and family tomorrow to discuss their wishes for rehab vs home with family support. Noted refusing SNF. Limited beds available over next 48 hrs. [id number]

## 2015-11-29 NOTE — Clinical Social Work Note (Signed)
Clinical Social Worker met with patient and adult children at bedside in reference to post-acute placement for SNF. CSW introduced CSW role and explained PT recommendation. Patient's daughter, Alyse Low reported that family is not interested in SNF placement and prefers that patient returns home with home health care. Pt's dtr stated that she lives with patient and he has 38 other children that are able to assist with home care. Family supportive and involved in pt's care. CSW briefly explained home health care.  RNCM notified.   Patient to d/c home with home health once medically stable for d/c. RNCM aware. No further concerns reported by family.   Clinical Social Worker will sign off for now as social work intervention is no longer needed. Please consult [place] again if new need arises.  Glendon Axe, MSW, LCSWA [phone] 11/29/2015 11:55 AM

## 2015-11-29 NOTE — Progress Notes (Signed)
Physical Therapy Treatment Patient Details Name: Derrick [name] MRN: [id number] DOB: [birth year] Today's Date: 11/29/2015    History of Present Illness Derrick [name] is an 65 y.o. male history of diabetes mellitus, hypertension, hyperlipidemia, previous stroke and coronary artery disease admitted with acute onset of left facial droop and slurred speech.  MRI positive for Acute infarction in the lateral medulla on the left consistent with left PICA territory infarction.    PT Comments    Pt con't to demo proprioception deficits, impaired spatial awareness, L sided neglect, L sided weakness and impaired motor planning. Spoke with pt regarding CIR upon d/c and he agreed to think about it. Pt to strongly benefit from CIR to address mentioned deficits and progress towards indep mobility.  Follow Up Recommendations  CIR     Equipment Recommendations  Rolling walker with 5" wheels    Recommendations for Other Services Rehab consult     Precautions / Restrictions Precautions Precautions: Fall Restrictions Weight Bearing Restrictions: No    Mobility  Bed Mobility Overal bed mobility: Needs Assistance Bed Mobility: Supine to Sit Rolling: Modified independent (Device/Increase time) Sidelying to sit: Min assist       General bed mobility comments: assist for trunk elevation  Transfers Overall transfer level: Needs assistance Equipment used: None Transfers: Sit to/from [facility] Sit to Stand: Min assist Stand pivot transfers: Mod assist       General transfer comment: heavy lean to the left, requires cues from therapist to correct   Ambulation/Gait Ambulation/Gait assistance: Mod assist;+2 physical assistance (2nd person for chair follow) Ambulation Distance (Feet): 25 Feet (x1, 35'x1) Assistive device: Rolling walker (2 wheeled) Gait Pattern/deviations: Step-through pattern;Staggering left;Narrow base of support;Scissoring Gait velocity: decraesed Gait  velocity interpretation: Below normal speed for age/gender General Gait Details: pt with strong lean to the left requiring tech to provide support on left side to bring body to neutral and then therapist was on L LE to promotoe optimal stepping pattern due to pt with adduction/cross over gait and impaired proprioception. pt unable to self correct to midline position, requiring maxA x2 for optimal gait pattern   Stairs            Wheelchair Mobility    Modified Rankin (Stroke Patients Only) Modified Rankin (Stroke Patients Only) Pre-Morbid Rankin Score: No significant disability Modified Rankin: Moderately severe disability     Balance Overall balance assessment: Needs assistance Sitting-balance support: Feet supported;No upper extremity supported Sitting balance-Leahy Scale: Fair Sitting balance - Comments: Pt lost balance to Lt when donning/doffing Lt sock  Postural control: Left lateral lean Standing balance support: No upper extremity supported;Single extremity supported Standing balance-Leahy Scale: Poor Standing balance comment: Pt leans to Lt.  Does not initiate balance reaction without cuing from therapist.  Requires min - mod A to correct balance                     Cognition Arousal/Alertness: Awake/alert Behavior During Therapy: WFL for tasks assessed/performed Overall Cognitive Status: Within Functional Limits for tasks assessed                      Exercises      General Comments General comments (skin integrity, edema, etc.): family present at beginning of session.  Discussed rehab options with them.  They all (including pt) feel he would like CIR if that is an option - MD notified       Pertinent Vitals/Pain Pain Assessment: No/denies pain  Home Living Family/patient expects to be discharged to:: Private residence Living Arrangements: Children Available Help at Discharge: Family;Available 24 hours/day Type of Home: Mobile home Home  Access: Stairs to enter Entrance Stairs-Rails: Right;Left Home Layout: One level Home Equipment: Cane - single point      Prior Function Level of Independence: Independent;Independent with assistive device(s)      Comments: drives, uses a cane and performs ADLs on his own. Retired [employer]   PT [place] (current goals can now be found in the care plan section) Acute Rehab PT Goals Patient Stated Goal: to get better  Progress towards PT goals: Progressing toward goals    Frequency  Min 4X/week    PT Plan Discharge plan needs to be updated    Co-evaluation             End of Session Equipment Utilized During Treatment: Gait belt Activity Tolerance: Patient tolerated treatment well Patient left: in chair;with call bell/phone within reach     Time: 0900-0928 PT Time Calculation (min) (ACUTE ONLY): 28 min  Charges:  $Gait Training: 23-37 mins                    G Codes:      Kingsley Callander 11/29/2015, 3:35 PM  Kittie Plater, PT, DPT Pager #: [phone] Office #: [phone]

## 2015-11-29 NOTE — Progress Notes (Signed)
STROKE TEAM PROGRESS NOTE   SUBJECTIVE (INTERVAL HISTORY) Wife and sister are at bedside. He did not pass swallow and had Cortrak placed yesterday. Currently on TF. Has hiccup over night on and off, will order baclofen. Due to TF, his best choice for rehab will be CIR.    OBJECTIVE Temp:  [97.7 F (36.5 C)-98.5 F (36.9 C)] 97.7 F (36.5 C) (05/16 2216) Pulse Rate:  [65-85] 80 (05/16 2216) Cardiac Rhythm:  [-] Normal sinus rhythm (05/16 1900) Resp:  [16-20] 20 (05/16 2216) BP: (126-176)/(74-99) 176/97 mmHg (05/16 2216) SpO2:  [91 %-100 %] 99 % (05/16 2216) Weight:  [236 lb 3.2 oz (107.14 kg)] 236 lb 3.2 oz (107.14 kg) (05/16 0543)  CBC:   Recent Labs Lab 11/25/15 1610 11/25/15 1617  WBC 7.5  --   NEUTROABS 4.2  --   HGB 13.1 13.6  HCT 37.2* 40.0  MCV 77.7*  --   PLT SPECIMEN CHECKED FOR CLOTS  --     Basic Metabolic Panel:   Recent Labs Lab 11/25/15 1610 11/25/15 1617  NA 137 144  K 3.6 3.8  CL 109 106  CO2 22  --   GLUCOSE 187* 184*  BUN 8 6  CREATININE 1.04 1.00  CALCIUM 8.6*  --     Lipid Panel:     Component Value Date/Time   CHOL 108 11/26/2015 0420   TRIG 21 11/26/2015 0420   HDL 45 11/26/2015 0420   CHOLHDL 2.4 11/26/2015 0420   VLDL 4 11/26/2015 0420   LDLCALC 59 11/26/2015 0420   HgbA1c:  Lab Results  Component Value Date   HGBA1C 6.1* 11/26/2015   Urine Drug Screen:     Component Value Date/Time   LABOPIA NONE DETECTED 11/25/2015 1654   COCAINSCRNUR NONE DETECTED 11/25/2015 1654   LABBENZ NONE DETECTED 11/25/2015 1654   AMPHETMU NONE DETECTED 11/25/2015 1654   THCU NONE DETECTED 11/25/2015 1654   LABBARB NONE DETECTED 11/25/2015 1654      IMAGING I have personally reviewed the radiological images below and agree with the radiology interpretations.  Ct Angio Head and Neck W/cm &/or Wo Cm 11/25/2015   Poor contrast bolus timing. I do not think there is any large or medium vessel occlusion that I can visualize. No narrowing at  either carotid bifurcation. Extensive atherosclerotic calcification in the carotid siphon regions and supraclinoid internal carotid arteries with potential for flow limiting stenosis in those regions. Calcified plaque affecting the distal vertebral arteries, left worse than right, with potential for flow limiting stenosis in those regions.   Ct Head Wo Contrast 11/25/2015   No acute intracranial abnormality.  No significant change.   Ct Head Wo Contrast 11/25/2015   1.  No evidence for acute intracranial abnormality.  2. Remote fracture of the medial wall of the left orbit.  3. Numerous metallic retained foreign bodies. One of these is anterior to the left globe.   Mr Brain Wo Contrast  11/26/2015  IMPRESSION: Acute infarction in the lateral medulla on the left consistent with left PICA territory infarction. No other acute infarction. Mild chronic small-vessel disease of the cerebral hemispheric white matter. Extensive artifact related to metallic foreign objects in the superficial soft tissues appear   TTE - - Left ventricle: The cavity size was normal. There was moderate  concentric hypertrophy. Systolic function was vigorous. The  estimated ejection fraction was in the range of 65% to 70%. Wall  motion was normal; there were no regional wall motion  abnormalities. Doppler  parameters are consistent with abnormal  left ventricular relaxation (grade 1 diastolic dysfunction). - Aortic valve: Valve area (Vmax): 3.78 cm^2. - Left atrium: The atrium was mildly dilated.   Physical exam:  Temp:  [97.7 F (36.5 C)-98.5 F (36.9 C)] 97.7 F (36.5 C) (05/16 2216) Pulse Rate:  [65-85] 80 (05/16 2216) Resp:  [16-20] 20 (05/16 2216) BP: (126-176)/(74-99) 176/97 mmHg (05/16 2216) SpO2:  [91 %-100 %] 99 % (05/16 2216) Weight:  [236 lb 3.2 oz (107.14 kg)] 236 lb 3.2 oz (107.14 kg) (05/16 0543)  General - Well nourished, well developed, in no apparent distress.  Ophthalmologic - Fundi not  visualized due to noncooperation.  Cardiovascular - irregular rhythm and tachycardia, on tele consistent with MAT.  Mental Status -  Level of arousal and orientation to time, place, and person were intact. Language including expression, naming, repetition, comprehension was assessed and found intact. Fund of Knowledge was assessed and was intact.  Cranial Nerves II - XII - II - Visual field intact OU. III, IV, VI - Extraocular movements intact, left mild ptosis, PERRL. V - Facial sensation intact bilaterally. VII - left facial droop, left eye closing mildly weaker than right. VIII - Hearing & vestibular intact bilaterally. X - Palate elevates symmetrically, hoarseness speech. XI - Chin turning & shoulder shrug intact bilaterally. XII - Tongue protrusion intact.  Motor Strength - The patient's strength was normal in all extremities except LLE 4+/5 proximal and distal and pronator drift was absent.  Bulk was normal and fasciculations were absent.   Motor Tone - Muscle tone was assessed at the neck and appendages and was normal.  Reflexes - The patient's reflexes were 1+ in all extremities and he had no pathological reflexes.  Sensory - Light touch, temperature/pinprick, vibration and proprioception, and Romberg testing were assessed and were symmetrical.    Coordination - The patient had subtle dysmetria on the left FTN.  Tremor was absent.  Gait and Station - deferred due to safety concerns.   ASSESSMENT/PLAN Derrick [name] is a 65 y.o. male with history of  hypertension, hyperlipidemia, coronary artery disease, previous strokes, history of colorectal cancer, obstructive sleep apnea, bipolar disorder, and history of headaches presenting with left-sided weakness and speech difficulties. Patient received tPA.  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  Resultant  Dysarthria, left facial weakness and left sided weakness, hiccup, hoarseness  MRI -  left lateral medullary infarct  CTA head and neck - b/l PCOM and small BA/VAs. Left VA distal calcified plaque and stenosis. Bilateral ICA siphon Calcified plaque and stenosis  2D Echo EF 65-70%  LDL 59  HgbA1c 6.1  VTE prophylaxis - SCDs and lovenox subq Diet NPO time specified  clopidogrel 75 mg daily prior to admission, now on DAPT due to intracranial stenosis. Continue DAPT for 3 months, and then plavix alone.   Patient counseled to be compliant with his antithrombotic medications  Ongoing aggressive stroke risk factor management  Therapy recommendations: Pending  Disposition:  Pending  Dysphagia  Did not pass swallowing  NG tube with tube feeding  Speech following  Best option for rehab is CIR at this time. Pt agreed.  Intractable hiccup  Common for brainstem infarct  Put on baclofen  Close monitoring  Hypertension  BP still at high side Permissive hypertension (OK if < 220/120) but gradually normalize in 5-7 days BP goal 130-150 due to intracranial stenosis Resume home meds in full  Hyperlipidemia  Home meds:  Zocor 40 mg daily  LDL 59, goal < 70  Resume Zocor   Continue statin at discharge  Diabetes  HgbA1c 6.1, goal < 7.0  Controlled  On SSI  Put back on po meds  Other Stroke Risk Factors  Advanced age  Cigarette smoker, quit smoking 18 years ago.  Obesity, Body mass index is 35.92 kg/(m^2).   Hx stroke/TIA - minimal right  side weakness at baseline  Coronary artery disease s/p PCI  Obstructive sleep apnea - does not use CPAP anymore. Encouraged to follow up with sleep doctor, untreated OSA is a risk factor for stroke.  Other Active Problems    Hospital day # 4  Rosalin Hawking, MD PhD Stroke Neurology 11/29/2015 11:05 PM     To contact Stroke Continuity provider, please refer to [email or web address] After hours, contact General Neurology

## 2015-11-29 NOTE — Progress Notes (Signed)
Speech Language Pathology Treatment: Dysphagia  Patient Details Name: Derrick [name] MRN: [id number] DOB: 19-Sep-1950 Today's Date: 11/29/2015 Time: 1439-1500 SLP Time Calculation (min) (ACUTE ONLY): 21 min  Assessment / Plan / Recommendation Clinical Impression  Dysphagia intervention included po trials with puree, tongue base and pharyngeal strengthening exercises. Min verbal/visual cues and functional purpose/reasoning for increased execution of exercises. Masako (tongue base), effortful swallows and pitch modulations performed with moderate accuracy. Multiple swallows mildly less and delayed cough x 1. Encouraged pt to practice exercises.    HPI HPI: 65 y.o. male history of diabetes mellitus, hypertension, hyperlipidemia, bipolar, previous stroke (90's) and coronary artery disease admitted iwth acute onset left sided weakness. CT scan showed no acute intracranial abnormality, given TPA (may be unable to have MRI, ? metal in head). No recent CXR and no ST notes.       SLP Plan  Continue with current plan of care     Recommendations  Diet recommendations: NPO Medication Administration: Via alternative means             Oral Care Recommendations: Oral care QID Follow up Recommendations: Inpatient Rehab Plan: Continue with current plan of care     GO                Houston Siren 11/29/2015, 3:25 PM  .[place] [employer] [phone]

## 2015-11-29 NOTE — Care Management Important Message (Signed)
Important Message  Patient Details  Name: AYDN BOLIVAR MRN: [id number] Date of Birth: 22-Jan-1951   Medicare Important Message Given:  Yes    Rolm Baptise, RN 11/29/2015, 11:55 AM
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
## 2015-11-29 NOTE — Evaluation (Signed)
Occupational Therapy Evaluation Patient Details Name: Derrick [name] MRN: [id number] DOB: August 09, 1950 Today's Date: 11/29/2015    History of Present Illness Derrick [name] is an 65 y.o. male history of diabetes mellitus, hypertension, hyperlipidemia, previous stroke and coronary artery disease admitted with acute onset of left facial droop and slurred speech.  MRI positive for Acute infarction in the lateral medulla on the left consistent with left PICA territory infarction.   Clinical Impression   Pt admitted with above. He demonstrates the below listed deficits and will benefit from continued OT to maximize safety and independence with BADLs.  Pt presents to OT with generalized weakness, visual deficits including horizontal nystagmus with Lt gaze and associated nausea with position changes.  He demonstrates impaired balance requiring min - mod A for static standing.  Pt and family are now agreeable to CIR prior to return home.  Recommend CIR.       Follow Up Recommendations  CIR;Supervision/Assistance - 24 hour    Equipment Recommendations  Tub/shower bench    Recommendations for Other Services Rehab consult     Precautions / Restrictions Precautions Precautions: Fall Restrictions Weight Bearing Restrictions: No      Mobility Bed Mobility Overal bed mobility: Needs Assistance Bed Mobility: Supine to Sit Rolling: Modified independent (Device/Increase time) Sidelying to sit: Min assist       General bed mobility comments: assist for trunk elevation  Transfers Overall transfer level: Needs assistance Equipment used: None Transfers: Sit to/from [facility] Sit to Stand: Min assist Stand pivot transfers: Mod assist       General transfer comment: heavy lean to the left, requires cues from therapist to correct     Balance Overall balance assessment: Needs assistance Sitting-balance support: Feet supported;No upper extremity supported Sitting  balance-Leahy Scale: Fair Sitting balance - Comments: Pt lost balance to Lt when donning/doffing Lt sock  Postural control: Left lateral lean Standing balance support: No upper extremity supported;Single extremity supported Standing balance-Leahy Scale: Poor Standing balance comment: Pt leans to Lt.  Does not initiate balance reaction without cuing from therapist.  Requires min - mod A to correct balance                             ADL Overall ADL's : Needs assistance/impaired Eating/Feeding: NPO   Grooming: Wash/dry hands;Wash/dry face;Oral care;Brushing hair;Minimal assistance;Standing   Upper Body Bathing: Set up;Sitting   Lower Body Bathing: Moderate assistance;Sit to/from stand   Upper Body Dressing : Supervision/safety;Set up;Sitting   Lower Body Dressing: Moderate assistance;Sit to/from stand Lower Body Dressing Details (indicate cue type and reason): Able to don/doff socks sitting EOB.  He loses his balance to the left when donning Lt sock.  He requires mod A for standing balance when pulling pants over hips (simulated) Toilet Transfer: Moderate assistance;Stand-pivot;BSC Toilet Transfer Details (indicate cue type and reason): Pt with lean to the Lt.  Requires min - mod facilitation to correct  Toileting- Clothing Manipulation and Hygiene: Moderate assistance;Sit to/from stand       Functional mobility during ADLs: Moderate assistance;Minimal assistance General ADL Comments: Pt requires assist due to balance deficits      Vision Vision Assessment?: Yes Eye Alignment: Within Functional Limits Ocular Range of Motion: Within Functional Limits Tracking/Visual Pursuits: Decreased smoothness of horizontal tracking Visual Fields: No apparent deficits Additional Comments: Pt currently denies diplopia.  Her reports he was seeing double yesterday, but reports it has improved with no evidence  of diplopia today.  Horizontally beating nystagmus noted with Lt gaze.  He  endorses dizziness when moving supine to EOB.  Instructed pt to fixate gaze on object to reduce impact of nystagmus    Perception Perception Perception Tested?: Yes   Praxis Praxis Praxis tested?: Within functional limits    Pertinent Vitals/Pain Pain Assessment: No/denies pain     Hand Dominance Right   Extremity/Trunk Assessment Upper Extremity Assessment Upper Extremity Assessment: Overall WFL for tasks assessed   Lower Extremity Assessment Lower Extremity Assessment: Defer to PT evaluation   Cervical / Trunk Assessment Cervical / Trunk Assessment: Normal   Communication Communication Communication: Expressive difficulties (dysarthric )   Cognition Arousal/Alertness: Awake/alert Behavior During Therapy: WFL for tasks assessed/performed Overall Cognitive Status: Within Functional Limits for tasks assessed                     General Comments       Exercises       Shoulder Instructions      Home Living Family/patient expects to be discharged to:: Private residence Living Arrangements: Children Available Help at Discharge: Family;Available 24 hours/day Type of Home: Mobile home Home Access: Stairs to enter Entrance Stairs-Number of Steps: 10 Entrance Stairs-Rails: Right;Left Home Layout: One level     Bathroom Shower/Tub: Tub/shower unit Shower/tub characteristics: Curtain Biochemist, clinical: Handicapped height     Home Equipment: [place] - single point          Prior Functioning/Environment Level of Independence: Independent;Independent with assistive device(s)        Comments: drives, uses a cane and performs ADLs on his own. Retired Barrister's clerk    OT Diagnosis: Generalized weakness;Disturbance of vision   OT Problem List: Decreased activity tolerance;Impaired balance (sitting and/or standing);Impaired vision/perception;Decreased coordination;Decreased safety awareness;Decreased knowledge of use of DME or AE   OT Treatment/Interventions:  Self-care/ADL training;Neuromuscular education;DME and/or AE instruction;Therapeutic activities;Visual/perceptual remediation/compensation;Patient/family education;Balance training    OT Goals(Current goals can be found in the care plan section) Acute Rehab OT Goals Patient Stated Goal: to get better  OT Goal Formulation: With patient Time For Goal Achievement: 12/13/15 Potential to Achieve Goals: Good ADL Goals Pt Will Perform Lower Body Bathing: with min guard assist;sit to/from stand Pt Will Perform Lower Body Dressing: with min guard assist;sit to/from stand Pt Will Transfer to Toilet: with min assist;ambulating;regular height toilet;bedside commode;grab bars Pt Will Perform Toileting - Clothing Manipulation and hygiene: with min guard assist;sit to/from stand  OT Frequency: Min 2X/week   Barriers to D/C:            Co-evaluation              End of Session Equipment Utilized During Treatment: Gait belt Nurse Communication: Mobility status  Activity Tolerance: Patient tolerated treatment well Patient left: in chair;with chair alarm set;with family/visitor present   Time: [id number] OT Time Calculation (min): 41 min Charges:  OT General Charges $OT Visit: 1 Procedure OT Evaluation $OT Eval Moderate Complexity: 1 Procedure OT Treatments $Therapeutic Activity: 23-37 mins G-Codes:    Randy Whitener M 12/18/15, 2:46 PM

## 2015-11-29 NOTE — Progress Notes (Signed)
Feeding tube begins at 47ml/hr at 0800, adjust feeding rate to 61ml/hr at 0000 and to 74m/hr at 0400.

## 2015-11-30 LAB — GLUCOSE, CAPILLARY
GLUCOSE-CAPILLARY: 185 mg/dL — AB (ref 65–99)
GLUCOSE-CAPILLARY: 132 mg/dL — AB (ref 65–99)
Glucose-Capillary: 165 mg/dL — ABNORMAL HIGH (ref 65–99)
Glucose-Capillary: 127 mg/dL — ABNORMAL HIGH (ref 65–99)

## 2015-11-30 MED ORDER — ATORVASTATIN CALCIUM 10 MG PO TABS
20.0000 mg | ORAL_TABLET | Freq: Every day | ORAL | Status: DC
  Administered 2015-11-30: 20 mg via ORAL
  Filled 2015-11-30: qty 2

## 2015-11-30 MED ORDER — BACLOFEN 10 MG PO TABS
10.0000 mg | ORAL_TABLET | Freq: Three times a day (TID) | ORAL | Status: DC
  Administered 2015-11-30 – 2015-12-01 (×3): 10 mg via ORAL
  Filled 2015-11-30 (×3): qty 1

## 2015-11-30 MED ORDER — INSULIN GLARGINE 100 UNIT/ML ~~LOC~~ SOLN: 67.0000 [IU] | Freq: Every day | SUBCUTANEOUS | Status: DC

## 2015-11-30 MED ORDER — AMLODIPINE BESYLATE 10 MG PO TABS
10.0000 mg | ORAL_TABLET | Freq: Every day | ORAL | Status: DC
Start: 2015-11-30 — End: 2015-12-01
  Administered 2015-11-30 – 2015-12-01 (×2): 10 mg via ORAL
  Filled 2015-11-30 (×2): qty 1

## 2015-11-30 NOTE — Progress Notes (Signed)
Patient has blood sugar checks ACHS with no HS coverage, if patient is to have q4 blood sugar checks orders need to be corrected.

## 2015-11-30 NOTE — Progress Notes (Signed)
STROKE TEAM PROGRESS NOTE   SUBJECTIVE (INTERVAL HISTORY) No family is at bedside. He continues on TF. Hiccup better but not resolved yet. Will increase baclofen to 10mg  tid. Pending CIR.    OBJECTIVE Temp:  [97.7 F (36.5 C)-98.3 F (36.8 C)] 98 F (36.7 C) (05/17 1845) Pulse Rate:  [61-80] 72 (05/17 1845) Cardiac Rhythm:  [-] Normal sinus rhythm (05/17 0800) Resp:  [16-20] 18 (05/17 1845) BP: (142-176)/(90-103) 168/90 mmHg (05/17 1845) SpO2:  [99 %-100 %] 100 % (05/17 1845)  CBC:   Recent Labs Lab 11/25/15 1610 11/25/15 1617  WBC 7.5  --   NEUTROABS 4.2  --   HGB 13.1 13.6  HCT 37.2* 40.0  MCV 77.7*  --   PLT SPECIMEN CHECKED FOR CLOTS  --     Basic Metabolic Panel:   Recent Labs Lab 11/25/15 1610 11/25/15 1617  NA 137 144  K 3.6 3.8  CL 109 106  CO2 22  --   GLUCOSE 187* 184*  BUN 8 6  CREATININE 1.04 1.00  CALCIUM 8.6*  --     Lipid Panel:     Component Value Date/Time   CHOL 108 11/26/2015 0420   TRIG 21 11/26/2015 0420   HDL 45 11/26/2015 0420   CHOLHDL 2.4 11/26/2015 0420   VLDL 4 11/26/2015 0420   LDLCALC 59 11/26/2015 0420   HgbA1c:  Lab Results  Component Value Date   HGBA1C 6.1* 11/26/2015   Urine Drug Screen:     Component Value Date/Time   LABOPIA NONE DETECTED 11/25/2015 1654   COCAINSCRNUR NONE DETECTED 11/25/2015 1654   LABBENZ NONE DETECTED 11/25/2015 1654   AMPHETMU NONE DETECTED 11/25/2015 1654   THCU NONE DETECTED 11/25/2015 1654   LABBARB NONE DETECTED 11/25/2015 1654      IMAGING I have personally reviewed the radiological images below and agree with the radiology interpretations.  Ct Angio Head and Neck W/cm &/or Wo Cm 11/25/2015   Poor contrast bolus timing. I do not think there is any large or medium vessel occlusion that I can visualize. No narrowing at either carotid bifurcation. Extensive atherosclerotic calcification in the carotid siphon regions and supraclinoid internal carotid arteries with potential for  flow limiting stenosis in those regions. Calcified plaque affecting the distal vertebral arteries, left worse than right, with potential for flow limiting stenosis in those regions.   Ct Head Wo Contrast 11/25/2015   No acute intracranial abnormality.  No significant change.   Ct Head Wo Contrast 11/25/2015   1.  No evidence for acute intracranial abnormality.  2. Remote fracture of the medial wall of the left orbit.  3. Numerous metallic retained foreign bodies. One of these is anterior to the left globe.   Mr Brain Wo Contrast  11/26/2015  IMPRESSION: Acute infarction in the lateral medulla on the left consistent with left PICA territory infarction. No other acute infarction. Mild chronic small-vessel disease of the cerebral hemispheric white matter. Extensive artifact related to metallic foreign objects in the superficial soft tissues appear   TTE - - Left ventricle: The cavity size was normal. There was moderate  concentric hypertrophy. Systolic function was vigorous. The  estimated ejection fraction was in the range of 65% to 70%. Wall  motion was normal; there were no regional wall motion  abnormalities. Doppler parameters are consistent with abnormal  left ventricular relaxation (grade 1 diastolic dysfunction). - Aortic valve: Valve area (Vmax): 3.78 cm^2. - Left atrium: The atrium was mildly dilated.   Physical exam:  Temp:  [97.7 F (36.5 C)-98.3 F (36.8 C)] 98 F (36.7 C) (05/17 1845) Pulse Rate:  [61-80] 72 (05/17 1845) Resp:  [16-20] 18 (05/17 1845) BP: (142-176)/(90-103) 168/90 mmHg (05/17 1845) SpO2:  [99 %-100 %] 100 % (05/17 1845)  General - Well nourished, well developed, in no apparent distress.  Ophthalmologic - Fundi not visualized due to noncooperation.  Cardiovascular - irregular rhythm and tachycardia, on tele consistent with MAT.  Mental Status -  Level of arousal and orientation to time, place, and person were intact. Language including  expression, naming, repetition, comprehension was assessed and found intact. Fund of Knowledge was assessed and was intact.  Cranial Nerves II - XII - II - Visual field intact OU. III, IV, VI - Extraocular movements intact, left mild ptosis, PERRL. V - Facial sensation intact bilaterally. VII - left facial droop, left eye closing mildly weaker than right. VIII - Hearing & vestibular intact bilaterally. X - Palate elevates symmetrically, hoarseness speech. XI - Chin turning & shoulder shrug intact bilaterally. XII - Tongue protrusion intact.  Motor Strength - The patient's strength was normal in all extremities except LLE 4+/5 proximal and distal and pronator drift was absent.  Bulk was normal and fasciculations were absent.   Motor Tone - Muscle tone was assessed at the neck and appendages and was normal.  Reflexes - The patient's reflexes were 1+ in all extremities and he had no pathological reflexes.  Sensory - Light touch, temperature/pinprick, vibration and proprioception, and Romberg testing were assessed and were symmetrical.    Coordination - The patient had subtle dysmetria on the left FTN.  Tremor was absent.  Gait and Station - deferred due to safety concerns.   ASSESSMENT/PLAN Derrick [name] is a 65 y.o. male with history of  hypertension, hyperlipidemia, coronary artery disease, previous strokes, history of colorectal cancer, obstructive sleep apnea, bipolar disorder, and history of headaches presenting with left-sided weakness and speech difficulties. Patient received tPA.  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  Resultant  Dysarthria, left facial weakness and left sided weakness, hiccup, hoarseness  MRI - left lateral medullary infarct  CTA head and neck - b/l PCOM and small BA/VAs. Left VA distal calcified plaque and stenosis. Bilateral ICA siphon Calcified plaque and stenosis  2D Echo EF 65-70%  LDL 59  HgbA1c 6.1  VTE  prophylaxis - SCDs and lovenox subq Diet NPO time specified  clopidogrel 75 mg daily prior to admission, now on DAPT due to intracranial stenosis. Continue DAPT for 3 months, and then plavix alone.   Patient counseled to be compliant with his antithrombotic medications  Ongoing aggressive stroke risk factor management  Therapy recommendations: CIR  Disposition:  Pending  Dysphagia  Did not pass swallowing  NG tube with tube feeding  Speech following  Pending CIR.  Intractable hiccup  Common for brainstem infarct  Put on baclofen, improving  Increasing baclofen to 10mg  tid  Hypertension  BP still at high side Permissive hypertension (OK if < 220/120) but gradually normalize in 5-7 days BP goal 130-150 due to intracranial stenosis Resume home meds (lisinopril and metoprolol) Add amlodipine 10mg   Hyperlipidemia  Home meds:  Zocor 40 mg daily  LDL 59, goal < 70  Resume Zocor   Continue statin at discharge  Diabetes  HgbA1c 6.1, goal < 7.0  Controlled  On SSI  Put back on po meds   Other Stroke Risk Factors  Advanced age  Cigarette smoker, quit  smoking 18 years ago.  Obesity, Body mass index is 35.92 kg/(m^2).   Hx stroke/TIA - minimal right  side weakness at baseline  Coronary artery disease s/p PCI  Obstructive sleep apnea - does not use CPAP anymore. Encouraged to follow up with sleep doctor, untreated OSA is a risk factor for stroke.  Other Active Problems    Hospital day # 5  Rosalin Hawking, MD PhD Stroke Neurology 11/30/2015 7:13 PM     To contact Stroke Continuity provider, please refer to [email or web address] After hours, contact General Neurology

## 2015-11-30 NOTE — Progress Notes (Signed)
Nutrition Follow-up  DOCUMENTATION CODES:   Obesity unspecified  INTERVENTION:  Continue Jevity 1.2 @ 70 ml/hr via NGT. Tube feeding regimen provides 2016 kcal (100% of needs), 93 grams of protein, and 1361 ml of H2O.   When IV fluids are discontinued, recommend providing 130 ml free water flushes every 3 hours to provide 1040 ml. TF plus FWF's will provide 2401 ml.    NUTRITION DIAGNOSIS:   Inadequate oral intake related to inability to eat, dysphagia as evidenced by NPO status.  Ongoing  GOAL:   Patient will meet greater than or equal to 90% of their needs  Being met  MONITOR:   TF tolerance, Weight trends, Skin, I & O's, Diet advancement, Labs  REASON FOR ASSESSMENT:   Consult Enteral/tube feeding initiation and management  ASSESSMENT:   65 y.o. male history of diabetes mellitus, hypertension, hyperlipidemia, previous stroke and coronary artery disease, brought to the emergency room at [facility] following acute onset of left facial droop and slurred speech as well as weakness of left extremities   Pt is receiving Jevity 1.2 @ goal rate of 70 ml/hr. IV fluids at 40 ml/hr. Complains of ongoing hiccups, but he denies any abdominal discomfort or nausea. He reports plan for repeat SLP evaluation tomorrow.   Labs: glucose ranging 140 to 185 mg/dL  Diet Order:  Diet NPO time specified  Skin:  Reviewed, no issues  Last BM:  5/12  Height:   Ht Readings from Last 1 Encounters:  11/25/15 '5\' 8"'  (1.727 m)    Weight:   Wt Readings from Last 1 Encounters:  11/29/15 236 lb 3.2 oz (107.14 kg)    Ideal Body Weight:  70 kg  BMI:  Body mass index is 35.92 kg/(m^2).  Estimated Nutritional Needs:   Kcal:  1900-2100  Protein:  95-110 grams  Fluid:  2.4 L/day  EDUCATION NEEDS:   No education needs identified at this time  [place], LDN Inpatient Clinical Dietitian Pager: [phone] After Hours Pager: [phone]

## 2015-11-30 NOTE — Progress Notes (Addendum)
I met with pt at bedside to discuss a possible inpt rehab admission pending bed availability. Pt is in agreement. I contacted his daughter, Alyse Low, and she will call me upon her arrival this morning to discuss further. [phone] I met with pt and three daughters to discuss rehab goals. All are in agreement. Hopeful for bed tomorrow.

## 2015-11-30 NOTE — Care Management Note (Signed)
Case Management Note  Patient Details  Name: Derrick [name] MRN: [id number] Date of Birth: 06-16-1951  Subjective/Objective:     Pt admitted with CVA. He is from home with his family.             Action/Plan: PT/OT recs if for CIR. Pt refusing SNF/rehab. CM following for d/c needs.   Expected Discharge Date:                  Expected Discharge Plan:  [place]  In-House Referral:     Discharge planning Services     Post Acute Care Choice:    Choice offered to:     DME Arranged:    DME Agency:     HH Arranged:    [place] Agency:     Status of Service:  In process, will continue to follow  Medicare Important Message Given:  Yes Date Medicare IM Given:    Medicare IM give by:    Date Additional Medicare IM Given:    Additional Medicare Important Message give by:     If discussed at [place] of Stay Meetings, dates discussed:    Additional Comments:  Pollie Friar, RN 11/30/2015, 10:37 AM
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
## 2015-11-30 NOTE — Progress Notes (Signed)
Physical Therapy Treatment Patient Details Name: Derrick [name] MRN: [id number] DOB: 12-21-1950 Today's Date: 11/30/2015    History of Present Illness Derrick [name] is an 65 y.o. male history of diabetes mellitus, hypertension, hyperlipidemia, previous stroke and coronary artery disease admitted with acute onset of left facial droop and slurred speech.  MRI positive for Acute infarction in the lateral medulla on the left consistent with left PICA territory infarction.    PT Comments    Pt progressing well towards all goals. Remains to have significant L lateral lean and decreased proprioception with L LE during attempting to sequence gait pattern. Remains appropriate for CIR upon d/c.  Follow Up Recommendations  CIR     Equipment Recommendations  Rolling walker with 5" wheels    Recommendations for Other Services Rehab consult     Precautions / Restrictions Precautions Precautions: Fall Restrictions Weight Bearing Restrictions: No    Mobility  Bed Mobility Overal bed mobility: Needs Assistance Bed Mobility: Supine to Sit Rolling: Modified independent (Device/Increase time)         General bed mobility comments: used bed rail and hob elevated  Transfers Overall transfer level: Needs assistance Equipment used: Rolling walker (2 wheeled) Transfers: Sit to/from Stand Sit to Stand: Min assist         General transfer comment: v/c's for hand placement, v/c'sto achieve midline  Ambulation/Gait Ambulation/Gait assistance: Mod assist;+2 physical assistance Ambulation Distance (Feet): 25 Feet (x2) Assistive device: Rolling walker (2 wheeled) Gait Pattern/deviations: Step-through pattern;Decreased weight shift to right;Ataxic;Narrow base of support Gait velocity: slow Gait velocity interpretation: Below normal speed for age/gender General Gait Details: pt con't to have L lateral lean but is better than yesterday, modA to maintain midline and min/modA for optimal  kinematic to advance L LE due to strong adduction and impaired proprioception   Stairs            Wheelchair Mobility    Modified Rankin (Stroke Patients Only) Modified Rankin (Stroke Patients Only) Pre-Morbid Rankin Score: No significant disability Modified Rankin: Moderately severe disability     Balance Overall balance assessment: Needs assistance Sitting-balance support: Feet supported Sitting balance-Leahy Scale: Fair Sitting balance - Comments: pt able to achieve midline and maintain for 30 sec beefore L lateral lean   Standing balance support: No upper extremity supported Standing balance-Leahy Scale: Poor Standing balance comment: worked on achieve midline with feedback from mirror, pt required minA to adhere and maintain                    Cognition Arousal/Alertness: Awake/alert Behavior During Therapy: WFL for tasks assessed/performed Overall Cognitive Status: Within Functional Limits for tasks assessed                      Exercises Other Exercises Other Exercises: worked on single limb stance x 3 trials on R and L approx 15-20 sec each with maxAx2    General Comments        Pertinent Vitals/Pain Pain Assessment: No/denies pain    Home Living   Living Arrangements:  Veterinary surgeon, Derrick [name] and her 39 mos old daughter live with pt)                  Prior Function            PT Goals (current goals can now be found in the care plan section) Acute Rehab PT Goals Patient Stated Goal: to get better Progress towards PT goals: Progressing toward goals  Frequency  Min 4X/week    PT Plan Discharge plan needs to be updated    Co-evaluation             End of Session Equipment Utilized During Treatment: Gait belt Activity Tolerance: Patient tolerated treatment well Patient left: in chair;with call bell/phone within reach;with chair alarm set     Time: [id number] PT Time Calculation (min) (ACUTE ONLY): 30 min  Charges:   $Gait Training: 8-22 mins $Neuromuscular Re-education: 8-22 mins                    G Codes:      Kingsley Callander 11/30/2015, 1:57 PM  Kittie Plater, PT, DPT Pager #: [phone] Office #: [phone]

## 2015-12-01 ENCOUNTER — Encounter (HOSPITAL_COMMUNITY): Payer: Self-pay | Admitting: *Deleted

## 2015-12-01 ENCOUNTER — Inpatient Hospital Stay (HOSPITAL_COMMUNITY): Payer: Medicare Other

## 2015-12-01 ENCOUNTER — Inpatient Hospital Stay (HOSPITAL_COMMUNITY)
Admission: RE | Admit: 2015-12-01 | Discharge: 2015-12-13 | DRG: 057 | Disposition: A | Discharge status: Home / Self Care | Payer: Medicare Other | Source: Intra-hospital | Attending: Physical Medicine & Rehabilitation | Admitting: Physical Medicine & Rehabilitation

## 2015-12-01 DIAGNOSIS — Z9841 Cataract extraction status, right eye: Secondary | ICD-10-CM

## 2015-12-01 DIAGNOSIS — Z91041 Radiographic dye allergy status: Secondary | ICD-10-CM | POA: Diagnosis not present

## 2015-12-01 DIAGNOSIS — Z7902 Long term (current) use of antithrombotics/antiplatelets: Secondary | ICD-10-CM | POA: Diagnosis not present

## 2015-12-01 DIAGNOSIS — Z888 Allergy status to other drugs, medicaments and biological substances status: Secondary | ICD-10-CM

## 2015-12-01 DIAGNOSIS — Z87891 Personal history of nicotine dependence: Secondary | ICD-10-CM | POA: Diagnosis not present

## 2015-12-01 DIAGNOSIS — R1310 Dysphagia, unspecified: Secondary | ICD-10-CM | POA: Diagnosis present

## 2015-12-01 DIAGNOSIS — K5900 Constipation, unspecified: Secondary | ICD-10-CM | POA: Diagnosis present

## 2015-12-01 DIAGNOSIS — Z794 Long term (current) use of insulin: Secondary | ICD-10-CM

## 2015-12-01 DIAGNOSIS — G463 Brain stem stroke syndrome: Secondary | ICD-10-CM | POA: Diagnosis present

## 2015-12-01 DIAGNOSIS — E782 Mixed hyperlipidemia: Secondary | ICD-10-CM | POA: Diagnosis present

## 2015-12-01 DIAGNOSIS — E1142 Type 2 diabetes mellitus with diabetic polyneuropathy: Secondary | ICD-10-CM | POA: Diagnosis present

## 2015-12-01 DIAGNOSIS — R066 Hiccough: Secondary | ICD-10-CM | POA: Diagnosis present

## 2015-12-01 DIAGNOSIS — Z9842 Cataract extraction status, left eye: Secondary | ICD-10-CM | POA: Diagnosis not present

## 2015-12-01 DIAGNOSIS — R490 Dysphonia: Secondary | ICD-10-CM | POA: Diagnosis present

## 2015-12-01 DIAGNOSIS — I6302 Cerebral infarction due to thrombosis of basilar artery: Secondary | ICD-10-CM

## 2015-12-01 DIAGNOSIS — Z961 Presence of intraocular lens: Secondary | ICD-10-CM | POA: Diagnosis present

## 2015-12-01 DIAGNOSIS — R270 Ataxia, unspecified: Secondary | ICD-10-CM | POA: Diagnosis present

## 2015-12-01 DIAGNOSIS — R471 Dysarthria and anarthria: Secondary | ICD-10-CM | POA: Diagnosis present

## 2015-12-01 DIAGNOSIS — F419 Anxiety disorder, unspecified: Secondary | ICD-10-CM | POA: Diagnosis present

## 2015-12-01 DIAGNOSIS — I69391 Dysphagia following cerebral infarction: Secondary | ICD-10-CM | POA: Diagnosis not present

## 2015-12-01 DIAGNOSIS — G464 Cerebellar stroke syndrome: Secondary | ICD-10-CM | POA: Diagnosis not present

## 2015-12-01 DIAGNOSIS — Z9861 Coronary angioplasty status: Secondary | ICD-10-CM

## 2015-12-01 DIAGNOSIS — Z79899 Other long term (current) drug therapy: Secondary | ICD-10-CM | POA: Diagnosis not present

## 2015-12-01 DIAGNOSIS — I69354 Hemiplegia and hemiparesis following cerebral infarction affecting left non-dominant side: Principal | ICD-10-CM

## 2015-12-01 DIAGNOSIS — F319 Bipolar disorder, unspecified: Secondary | ICD-10-CM | POA: Diagnosis present

## 2015-12-01 DIAGNOSIS — I2510 Atherosclerotic heart disease of native coronary artery without angina pectoris: Secondary | ICD-10-CM | POA: Diagnosis present

## 2015-12-01 DIAGNOSIS — I639 Cerebral infarction, unspecified: Secondary | ICD-10-CM | POA: Diagnosis present

## 2015-12-01 DIAGNOSIS — I69393 Ataxia following cerebral infarction: Secondary | ICD-10-CM

## 2015-12-01 DIAGNOSIS — E039 Hypothyroidism, unspecified: Secondary | ICD-10-CM | POA: Diagnosis present

## 2015-12-01 DIAGNOSIS — I10 Essential (primary) hypertension: Secondary | ICD-10-CM | POA: Diagnosis present

## 2015-12-01 LAB — CBC
HCT: 38.7 % — ABNORMAL LOW (ref 39.0–52.0)
Hemoglobin: 13.9 g/dL (ref 13.0–17.0)
MCH: 27.4 pg (ref 26.0–34.0)
MCHC: 35.9 g/dL (ref 30.0–36.0)
MCV: 76.2 fL — ABNORMAL LOW (ref 78.0–100.0)
Platelets: 259 10*3/uL (ref 150–400)
RBC: 5.08 MIL/uL (ref 4.22–5.81)
RDW: 14.7 % (ref 11.5–15.5)
WBC: 7 10*3/uL (ref 4.0–10.5)

## 2015-12-01 LAB — BASIC METABOLIC PANEL WITH GFR
Anion gap: 13 (ref 5–15)
BUN: 16 mg/dL (ref 6–20)
CO2: 22 mmol/L (ref 22–32)
Calcium: 8.8 mg/dL — ABNORMAL LOW (ref 8.9–10.3)
Chloride: 105 mmol/L (ref 101–111)
Creatinine, Ser: 1.24 mg/dL (ref 0.61–1.24)
GFR calc Af Amer: >60 mL/min
GFR calc non Af Amer: 59 mL/min — ABNORMAL LOW
Glucose, Bld: 198 mg/dL — ABNORMAL HIGH (ref 65–99)
Potassium: 3.7 mmol/L (ref 3.5–5.1)
Sodium: 140 mmol/L (ref 135–145)

## 2015-12-01 LAB — GLUCOSE, CAPILLARY
GLUCOSE-CAPILLARY: 106 mg/dL — AB (ref 65–99)
Glucose-Capillary: 179 mg/dL — ABNORMAL HIGH (ref 65–99)
Glucose-Capillary: 165 mg/dL — ABNORMAL HIGH (ref 65–99)
Glucose-Capillary: 167 mg/dL — ABNORMAL HIGH (ref 65–99)

## 2015-12-01 MED ORDER — ENOXAPARIN SODIUM 40 MG/0.4ML ~~LOC~~ SOLN
40.0000 mg | SUBCUTANEOUS | Status: DC
  Administered 2015-12-01 – 2015-12-12 (×12): 40 mg via SUBCUTANEOUS
  Filled 2015-12-01 (×12): qty 0.4

## 2015-12-01 MED ORDER — ONDANSETRON HCL 4 MG PO TABS
4.0000 mg | ORAL_TABLET | Freq: Four times a day (QID) | ORAL | Status: DC | PRN
  Administered 2015-12-03 – 2015-12-05 (×3): 4 mg via ORAL
  Filled 2015-12-01 (×3): qty 1

## 2015-12-01 MED ORDER — ATORVASTATIN CALCIUM 20 MG PO TABS
20.0000 mg | ORAL_TABLET | Freq: Every day | ORAL | Status: DC
  Administered 2015-12-02 – 2015-12-12 (×11): 20 mg via ORAL
  Filled 2015-12-01 (×11): qty 1

## 2015-12-01 MED ORDER — INSULIN ASPART 100 UNIT/ML ~~LOC~~ SOLN
0.0000 [IU] | Freq: Three times a day (TID) | SUBCUTANEOUS | Status: DC
  Administered 2015-12-02 – 2015-12-03 (×4): 4 [IU] via SUBCUTANEOUS
  Administered 2015-12-03: 7 [IU] via SUBCUTANEOUS
  Administered 2015-12-03: 3 [IU] via SUBCUTANEOUS
  Administered 2015-12-04 (×3): 4 [IU] via SUBCUTANEOUS
  Administered 2015-12-05: 3 [IU] via SUBCUTANEOUS
  Administered 2015-12-05: 7 [IU] via SUBCUTANEOUS
  Administered 2015-12-05: 3 [IU] via SUBCUTANEOUS
  Administered 2015-12-06: 4 [IU] via SUBCUTANEOUS
  Administered 2015-12-06: 7 [IU] via SUBCUTANEOUS
  Administered 2015-12-06: 3 [IU] via SUBCUTANEOUS
  Administered 2015-12-07 (×3): 4 [IU] via SUBCUTANEOUS
  Administered 2015-12-08: 3 [IU] via SUBCUTANEOUS
  Administered 2015-12-08 (×2): 4 [IU] via SUBCUTANEOUS
  Administered 2015-12-09: 7 [IU] via SUBCUTANEOUS
  Administered 2015-12-10: 4 [IU] via SUBCUTANEOUS
  Administered 2015-12-10: 3 [IU] via SUBCUTANEOUS
  Administered 2015-12-11 – 2015-12-12 (×4): 4 [IU] via SUBCUTANEOUS

## 2015-12-01 MED ORDER — ASPIRIN 325 MG PO TABS
325.0000 mg | ORAL_TABLET | Freq: Every day | ORAL | Status: DC
  Administered 2015-12-02 – 2015-12-13 (×12): 325 mg via ORAL
  Filled 2015-12-01 (×12): qty 1

## 2015-12-01 MED ORDER — LINAGLIPTIN 5 MG PO TABS
5.0000 mg | ORAL_TABLET | Freq: Every day | ORAL | Status: DC
  Administered 2015-12-02 – 2015-12-13 (×12): 5 mg via ORAL
  Filled 2015-12-01 (×13): qty 1

## 2015-12-01 MED ORDER — LISINOPRIL 20 MG PO TABS
20.0000 mg | ORAL_TABLET | Freq: Two times a day (BID) | ORAL | Status: DC
Start: 2015-12-01 — End: 2015-12-10
  Administered 2015-12-01 – 2015-12-09 (×17): 20 mg via ORAL
  Filled 2015-12-01 (×17): qty 1

## 2015-12-01 MED ORDER — RESOURCE THICKENUP CLEAR PO POWD
ORAL | Status: DC | PRN
  Filled 2015-12-01: qty 125

## 2015-12-01 MED ORDER — CETYLPYRIDINIUM CHLORIDE 0.05 % MT LIQD
7.0000 mL | Freq: Two times a day (BID) | OROMUCOSAL | Status: DC
  Administered 2015-12-01 – 2015-12-13 (×22): 7 mL via OROMUCOSAL

## 2015-12-01 MED ORDER — AMLODIPINE BESYLATE 10 MG PO TABS
10.0000 mg | ORAL_TABLET | Freq: Every day | ORAL | Status: DC
  Administered 2015-12-02 – 2015-12-13 (×12): 10 mg via ORAL
  Filled 2015-12-01 (×12): qty 1

## 2015-12-01 MED ORDER — CLOPIDOGREL BISULFATE 75 MG PO TABS
75.0000 mg | ORAL_TABLET | Freq: Every day | ORAL | Status: DC
  Administered 2015-12-02 – 2015-12-13 (×12): 75 mg via ORAL
  Filled 2015-12-01 (×12): qty 1

## 2015-12-01 MED ORDER — ACETAMINOPHEN 325 MG PO TABS
650.0000 mg | ORAL_TABLET | ORAL | Status: DC | PRN
  Filled 2015-12-01: qty 2

## 2015-12-01 MED ORDER — ONDANSETRON HCL 4 MG/2ML IJ SOLN: 4.0000 mg | Freq: Four times a day (QID) | INTRAMUSCULAR | Status: DC | PRN

## 2015-12-01 MED ORDER — BACLOFEN 10 MG PO TABS
10.0000 mg | ORAL_TABLET | Freq: Three times a day (TID) | ORAL | Status: DC
  Administered 2015-12-01: 10 mg via ORAL
  Filled 2015-12-01: qty 1

## 2015-12-01 MED ORDER — ENOXAPARIN SODIUM 40 MG/0.4ML ~~LOC~~ SOLN: 40.0000 mg | SUBCUTANEOUS | Status: DC

## 2015-12-01 MED ORDER — SORBITOL 70 % SOLN: 30.0000 mL | Freq: Every day | Status: DC | PRN

## 2015-12-01 MED ORDER — METOPROLOL TARTRATE 25 MG PO TABS
25.0000 mg | ORAL_TABLET | Freq: Two times a day (BID) | ORAL | Status: DC
  Administered 2015-12-01 – 2015-12-06 (×11): 25 mg via ORAL
  Filled 2015-12-01 (×11): qty 1

## 2015-12-01 MED ORDER — ACETAMINOPHEN 650 MG RE SUPP: 650.0000 mg | RECTAL | Status: DC | PRN

## 2015-12-01 MED ORDER — LEVOTHYROXINE SODIUM 25 MCG PO TABS
50.0000 ug | ORAL_TABLET | Freq: Every day | ORAL | Status: DC
  Administered 2015-12-02 – 2015-12-13 (×12): 50 ug via ORAL
  Filled 2015-12-01 (×12): qty 2

## 2015-12-01 MED ORDER — METFORMIN HCL 500 MG PO TABS
1000.0000 mg | ORAL_TABLET | Freq: Two times a day (BID) | ORAL | Status: DC
  Administered 2015-12-02 – 2015-12-13 (×23): 1000 mg via ORAL
  Filled 2015-12-01 (×23): qty 2

## 2015-12-01 NOTE — Progress Notes (Signed)
Patient's NG tube to left nasal nare coming out. Passed swallow eval and on D3 nectar thick diet. MD ordered to remove NG tube and order carried out.

## 2015-12-01 NOTE — Progress Notes (Signed)
Patient is discharged from room 5M01 at this time. Alert and in stable condition. IV site d/c'd as well as tele. Report given to receiving nurse, Roselyn Reef RN in unit 4100. Transported via wheelchair with all belongings at side.
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
## 2015-12-01 NOTE — Care Management Important Message (Signed)
Important Message  Patient Details  Name: Derrick [name] MRN: [id number] Date of Birth: [birth year]   Medicare Important Message Given:  Yes    Shada Nienaber T, RN 12/01/2015, 11:46 AM

## 2015-12-01 NOTE — Progress Notes (Addendum)
   MBSS complete. Full report located under chart review in imaging section. Recommend: Dys 3, nectar, small sips, no straws, attempt to avoid swallowing during hiccup (if able), crush meds. Suspect pt will meet nutritional needs safely and NGT could be d/c'd if MD agrees.     Derrick [name].Ed [employer] [phone]

## 2015-12-01 NOTE — Progress Notes (Signed)
I have an inpt rehab bed available today. I contacted Dr. Erlinda Hong and he is in agreement to d/c to rehab today.Pt is in agreement. I will make the arrangements for today. I will notify [place] and SW. [phone]

## 2015-12-01 NOTE — Progress Notes (Signed)
Physical Therapy Treatment Patient Details Name: Derrick [name] MRN: [id number] DOB: 06/29/1951 Today's Date: 12/01/2015    History of Present Illness Derrick [name] is an 65 y.o. male history of diabetes mellitus, hypertension, hyperlipidemia, previous stroke and coronary artery disease admitted with acute onset of left facial droop and slurred speech.  MRI positive for Acute infarction in the lateral medulla on the left consistent with left PICA territory infarction.    PT Comments    Patient better able to achieve and maintain midline today especially in the seated and static standing position. Manual and verbal cues needed to achieve midline during gait training esp when fatigued or with distractions. Continues to need assist with weight shifting and proper placement of LLE during advancement due to impaired proprioception. Left lateral lean worsened when tired. Plans to d/c to CIR today. Will follow.   Follow Up Recommendations  CIR     Equipment Recommendations  Rolling walker with 5" wheels    Recommendations for Other Services       Precautions / Restrictions Precautions Precautions: Fall Restrictions Weight Bearing Restrictions: No    Mobility  Bed Mobility Overal bed mobility: Needs Assistance Bed Mobility: Supine to Sit     Supine to sit: Supervision;HOB elevated     General bed mobility comments: used bed rail and hob elevated  Transfers Overall transfer level: Needs assistance Equipment used: Rolling walker (2 wheeled) Transfers: Sit to/from Stand Sit to Stand: Min assist         General transfer comment: Assist to steady in standing. V/c's to achieve midline. + dizziness. Stood from [employer], from Youth worker. transferred to chair post ambulation bout.  Ambulation/Gait Ambulation/Gait assistance: Mod assist Ambulation Distance (Feet): 50 Feet (+100') Assistive device: Rolling walker (2 wheeled) Gait Pattern/deviations: Step-through pattern;Step-to  pattern;Shuffle;Decreased weight shift to right;Ataxic;Narrow base of support Gait velocity: slow Gait velocity interpretation: Below normal speed for age/gender General Gait Details: pt with left lateral lean most notable when fatigued or distracted; Mod A to maintain midline and cues for optimal placement/advancement of LLE due to impaired proprioception. 1 seated rest break.   Stairs            Wheelchair Mobility    Modified Rankin (Stroke Patients Only) Modified Rankin (Stroke Patients Only) Pre-Morbid Rankin Score: No significant disability Modified Rankin: Moderately severe disability     Balance Overall balance assessment: Needs assistance Sitting-balance support: Feet supported;No upper extremity supported Sitting balance-Leahy Scale: Fair Sitting balance - Comments: Able to maintain midline sitting EOB with cues and self correct.    Standing balance support: During functional activity Standing balance-Leahy Scale: Poor Standing balance comment: Reliant on BUEs for support. Able to notice when he was leaning and find midline with verbal cues.                    Cognition Arousal/Alertness: Awake/alert Behavior During Therapy: WFL for tasks assessed/performed Overall Cognitive Status: Within Functional Limits for tasks assessed                      Exercises      General Comments        Pertinent Vitals/Pain Pain Assessment: No/denies pain    Home Living                      Prior Function            PT Goals (current goals can now be found  in the care plan section) Progress towards PT goals: Progressing toward goals    Frequency  Min 4X/week    PT Plan Current plan remains appropriate    Co-evaluation             End of Session Equipment Utilized During Treatment: Gait belt Activity Tolerance: Patient tolerated treatment well Patient left: in chair;with call bell/phone within reach;with chair alarm set      Time: 1311-1334 PT Time Calculation (min) (ACUTE ONLY): 23 min  Charges:  $Gait Training: 8-22 mins $Neuromuscular Re-education: 8-22 mins                    G Codes:      Duane Trias A Rebakah Cokley 12/01/2015, 1:52 PM Wray Kearns, Lynn, DPT [phone]

## 2015-12-01 NOTE — Progress Notes (Addendum)
OT Cancellation Note  Patient Details Name: Derrick [name] MRN: [id number] DOB: 1951/01/15   Cancelled Treatment:    Reason Eval/Treat Not Completed: Other (comment) (d/cing to CIR today) Per Inpatient Admissions note pt d/cing to CIR today. Will defer continued OT treatment to next venue.    Tyrone Schimke OTR/L Pager: [phone]  12/01/2015, 12:07 PM
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
## 2015-12-01 NOTE — H&P (Signed)
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 Electrodiagnostic Med  11/30/2015

## 2015-12-02 ENCOUNTER — Inpatient Hospital Stay (HOSPITAL_COMMUNITY): Payer: Medicare Other | Admitting: *Deleted

## 2015-12-02 ENCOUNTER — Inpatient Hospital Stay (HOSPITAL_COMMUNITY): Payer: Medicare Other | Admitting: Speech Pathology

## 2015-12-02 ENCOUNTER — Inpatient Hospital Stay (HOSPITAL_COMMUNITY): Payer: Medicare Other | Admitting: Occupational Therapy

## 2015-12-02 DIAGNOSIS — I69393 Ataxia following cerebral infarction: Secondary | ICD-10-CM

## 2015-12-02 DIAGNOSIS — G464 Cerebellar stroke syndrome: Secondary | ICD-10-CM

## 2015-12-02 DIAGNOSIS — R490 Dysphonia: Secondary | ICD-10-CM

## 2015-12-02 DIAGNOSIS — I69391 Dysphagia following cerebral infarction: Secondary | ICD-10-CM

## 2015-12-02 LAB — COMPREHENSIVE METABOLIC PANEL
ALT: 14 U/L — ABNORMAL LOW (ref 17–63)
AST: 13 U/L — AB (ref 15–41)
Albumin: 3.5 g/dL (ref 3.5–5.0)
Alkaline Phosphatase: 47 U/L (ref 38–126)
Anion gap: 12 (ref 5–15)
BILIRUBIN TOTAL: 0.9 mg/dL (ref 0.3–1.2)
BUN: 18 mg/dL (ref 6–20)
CO2: 22 mmol/L (ref 22–32)
Calcium: 9.1 mg/dL (ref 8.9–10.3)
Chloride: 108 mmol/L (ref 101–111)
Creatinine, Ser: 1.2 mg/dL (ref 0.61–1.24)
Glucose, Bld: 140 mg/dL — ABNORMAL HIGH (ref 65–99)
POTASSIUM: 4 mmol/L (ref 3.5–5.1)
Sodium: 142 mmol/L (ref 135–145)
TOTAL PROTEIN: 7.3 g/dL (ref 6.5–8.1)

## 2015-12-02 LAB — GLUCOSE, CAPILLARY
GLUCOSE-CAPILLARY: 152 mg/dL — AB (ref 65–99)
GLUCOSE-CAPILLARY: 164 mg/dL — AB (ref 65–99)
GLUCOSE-CAPILLARY: 173 mg/dL — AB (ref 65–99)
Glucose-Capillary: 151 mg/dL — ABNORMAL HIGH (ref 65–99)

## 2015-12-02 LAB — CBC WITH DIFFERENTIAL/PLATELET
BASOS ABS: 0 10*3/uL (ref 0.0–0.1)
Basophils Relative: 0 %
EOS PCT: 4 %
Eosinophils Absolute: 0.3 10*3/uL (ref 0.0–0.7)
HEMATOCRIT: 38.8 % — AB (ref 39.0–52.0)
Hemoglobin: 13.9 g/dL (ref 13.0–17.0)
LYMPHS ABS: 3.1 10*3/uL (ref 0.7–4.0)
LYMPHS PCT: 41 %
MCH: 27.1 pg (ref 26.0–34.0)
MCHC: 35.8 g/dL (ref 30.0–36.0)
MCV: 75.8 fL — AB (ref 78.0–100.0)
MONO ABS: 0.7 10*3/uL (ref 0.1–1.0)
MONOS PCT: 10 %
NEUTROS ABS: 3.3 10*3/uL (ref 1.7–7.7)
Neutrophils Relative %: 45 %
PLATELETS: 273 10*3/uL (ref 150–400)
RBC: 5.12 MIL/uL (ref 4.22–5.81)
RDW: 14.6 % (ref 11.5–15.5)
WBC: 7.5 10*3/uL (ref 4.0–10.5)

## 2015-12-02 MED ORDER — BACLOFEN 10 MG PO TABS
10.0000 mg | ORAL_TABLET | Freq: Four times a day (QID) | ORAL | Status: DC
  Administered 2015-12-02 – 2015-12-04 (×12): 10 mg via ORAL
  Filled 2015-12-02 (×12): qty 1

## 2015-12-02 NOTE — Care Management Note (Signed)
[facility] Individual Statement of Services  Patient Name:  Derrick [name]  Date:  12/02/2015  Welcome to the [facility].  Our goal is to provide you with an individualized program based on your diagnosis and situation, designed to meet your specific needs.  With this comprehensive rehabilitation program, you will be expected to participate in at least 3 hours of rehabilitation therapies Monday-Friday, with modified therapy programming on the weekends.  Your rehabilitation program will include the following services:  Physical Therapy (PT), Occupational Therapy (OT), Speech Therapy (ST), 24 hour per day rehabilitation nursing, Therapeutic Recreaction (TR), Case Management (Social Worker), Rehabilitation Medicine, Nutrition Services and Pharmacy Services  Weekly team conferences will be held on Wednesday to discuss your progress.  Your Social Worker will talk with you frequently to get your input and to update you on team discussions.  Team conferences with you and your family in attendance may also be held.  Expected length of stay: 7-10 days Overall anticipated outcome: mod/i level  Depending on your progress and recovery, your program may change. Your Social Worker will coordinate services and will keep you informed of any changes. Your Social Worker's name and contact numbers are listed  below.  The following services may also be recommended but are not provided by the [facility] will be made to provide these services after discharge if needed.  Arrangements include referral to agencies that provide these services.  Your insurance has been verified to be:  Medicare & Medicaid Your primary doctor is:  [name]  Pertinent information will be shared with your doctor and your insurance company.  Social  Worker:  Ovidio Kin, [place] or [phone]  Information discussed with and copy given to patient by: Elease Hashimoto, 12/02/2015, 10:27 AM
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
## 2015-12-02 NOTE — Progress Notes (Signed)
Occupational Therapy Session Note  Patient Details  Name: Derrick [name] MRN: [id number] Date of Birth: [birth year]  Today's Date: 12/02/2015 OT Individual Time: 1415-1445 OT Individual Time Calculation (min): 30 min    Skilled Therapeutic Interventions/Progress Updates:   Pt ambulated to and from the gym with use of the RW for support and mod assist.  Decreased step length at times on the left with decreased weightshift to the right as well during mobiity.  Mod assist needed for balance as he cannot correct lean to the left.  Worked in standing on weightshifts to the right to keep balance while stepping up on 5" block with the LLE.  Positioned pt up against a wall on the right side and instructed him to maintain contact with his shoulder and the wall when attempting to lift and place the LLE.  He was able to complete, needing min assist at times secondary to LOB forward.  Progressed to having him reach up on the wall with the RUE while holding washcloth (washing the wall) in order to increase weightshift as well.  Finished with work on stand to squat and back to stand while not pushing himself over to the left side.  Mod assist needed to maintain his balance.  Pt left in bed with bed alarm in place and call button in reach to conclude session.     Therapy Documentation Precautions:  Precautions Precautions: Fall Precaution Comments: L-lean/LOB Restrictions Weight Bearing Restrictions: No  Pain: Pain Assessment Pain Assessment: No/denies pain ADL: See Function Navigator for Current Functional Status.   Therapy/Group: Individual Therapy  Cali Cuartas OTR/L 12/02/2015, 4:47 PM

## 2015-12-02 NOTE — IPOC Note (Signed)
Overall Plan of Care [facility]) Patient Details Name: DERRICK MOCKLER MRN: [id number] DOB: 27-Apr-1951  Admitting Diagnosis: CVA  Hospital Problems: Principal Problem:   Stroke, Wallenberg's syndrome Active Problems:   CVA (cerebral infarction)   Dysphagia, post-stroke   Dysphonia   Ataxia, post-stroke     Functional Problem List: Nursing Edema, Endurance, Medication Management, Nutrition, Safety  PT Balance, Endurance, Motor, Pain, Safety  OT Balance, Endurance, Safety  SLP Cognition, Nutrition  TR         Basic ADL's: OT Grooming, Bathing, Dressing, Toileting     Advanced  ADL's: OT Simple Meal Preparation     Transfers: PT Bed Mobility, Bed to Chair, Car, Furniture, Floor  OT Toilet, Metallurgist: PT Ambulation, Stairs     Additional Impairments: OT None  SLP Swallowing, Communication, Social Cognition   Problem Solving  TR      Anticipated Outcomes Item Anticipated Outcome  Self Feeding independent  Swallowing  mod I for least restrictive diet   Basic self-care  modified independent level  Toileting  modified independent   Bathroom Transfers supervision to modified independent  Bowel/Bladder  independent-maintain continence  Transfers  Mod I   Locomotion  Mod I with LRAD  Communication  mod I  Cognition  mod I  Pain  less than 2 on pain scale  Safety/Judgment  free from fall    Therapy Plan: PT Intensity: Minimum of 1-2 x/day ,45 to 90 minutes PT Frequency: 5 out of 7 days PT Duration Estimated Length of Stay: 7-10 OT Intensity: Minimum of 1-2 x/day, 45 to 90 minutes OT Frequency: 5 out of 7 days OT Duration/Estimated Length of Stay: 9-10 days SLP Intensity: Minumum of 1-2 x/day, 30 to 90 minutes SLP Frequency: 3 to 5 out of 7 days SLP Duration/Estimated Length of Stay: 7-10 days       Team Interventions: Nursing Interventions Patient/Family Education, Disease Management/Prevention, Medication Management,  Dysphagia/Aspiration Precaution Training, Discharge Planning, Psychosocial Support  PT interventions Ambulation/gait training, Training and development officer, Community reintegration, Discharge planning, Disease management/prevention, DME/adaptive equipment instruction, Functional mobility training, Neuromuscular re-education, Pain management, Patient/family education, Psychosocial support, Stair training, Therapeutic Activities, Therapeutic Exercise, UE/LE Coordination activities, Visual/perceptual remediation/compensation  OT Interventions Training and development officer, Academic librarian, Discharge planning, Functional mobility training, Therapeutic Activities, Patient/family education, DME/adaptive equipment instruction, Neuromuscular re-education, Self Care/advanced ADL retraining  SLP Interventions Cognitive remediation/compensation, Dysphagia/aspiration precaution training, Multimodal communication approach, Cueing hierarchy, Functional tasks, Patient/family education, Medication managment  TR Interventions    SW/CM Interventions Discharge Planning, Psychosocial Support, Patient/Family Education    Team Discharge Planning: Destination: PT-Home ,OT- Home , SLP-Home Projected Follow-up: PT-24 hour supervision/assistance, Outpatient PT, OT-  Home health OT, SLP-Outpatient SLP Projected Equipment Needs: PT-Rolling walker with 5" wheels, OT- To be determined, SLP-None recommended by SLP Equipment Details: PT- , OT-  Patient/family involved in discharge planning: PT- Patient,  OT-Patient, SLP-Patient  MD ELOS: 7-10 days Medical Rehab Prognosis:  Excellent Assessment: The patient has been admitted for CIR therapies with the diagnosis of left medullar infarct. The team will be addressing functional mobility, strength, stamina, balance, safety, adaptive techniques and equipment, self-care, bowel and bladder mgt, patient and caregiver education, NMR, visual-perceptual awareness, stroke education, ego  support, community reintegration. Goals have been set at mod I to supervision with mobility, self-care and cognition/communication.    [name], MD, FAAPMR      See Team Conference Notes for weekly updates to the plan of care
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
## 2015-12-03 ENCOUNTER — Inpatient Hospital Stay (HOSPITAL_COMMUNITY): Payer: Medicare Other | Admitting: *Deleted

## 2015-12-03 ENCOUNTER — Inpatient Hospital Stay (HOSPITAL_COMMUNITY): Payer: Medicare Other | Admitting: Occupational Therapy

## 2015-12-03 LAB — GLUCOSE, CAPILLARY
GLUCOSE-CAPILLARY: 152 mg/dL — AB (ref 65–99)
GLUCOSE-CAPILLARY: 211 mg/dL — AB (ref 65–99)
Glucose-Capillary: 140 mg/dL — ABNORMAL HIGH (ref 65–99)
Glucose-Capillary: 187 mg/dL — ABNORMAL HIGH (ref 65–99)
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
## 2015-12-03 NOTE — Progress Notes (Signed)
Physical Therapy Session Note  Patient Details  Name: Derrick [name] MRN: [id number] Date of Birth: 1951/01/24  Today's Date: 12/03/2015 PT Individual Time:  -  0800-0900  60 min    and Today's Date: 12/03/2015 PT Group Time: 1330-1430 PT Group Time Calculation (min): 60 min   Short Term Goals: Week 1:  PT Short Term Goal 1 (Week 1): STG=LTG due to LOS, progress towards Mod I for all mobility  Skilled Therapeutic Interventions/Progress Updates:  First Tx focused on functional mobility training, gait with RW, and NMR via forced use, manual facilitation, and multi-modal cues.  Pt up in recliner, ready to go.   Sit<>stands and stand-pivot transfers with Min A 75% of the time, but up to Mod A during transitions due to L lean and occasional posterior LOB.   Gait with RW 2x125' and up to Mod A during turns due to decreased BOS and crossing midline. Multi-modal cues for decreasing L stand time and increasing R weight shfit  Nustep x10 min for coordination NMR with bil LEs only from levels 4>>5 challenged to 60 spm average. Pt needed only 1 reast break.   NMR at mat including each of the following with mirror for visual feedback to increase R weight shift - Serial sit<>stands with Bobath UE placement on R knee - lateral weight shifts to targets - L cone taps - L step-taps to 5" block - marching   Second tx included participation in stride right walking group for gait and balance training. STRIDE RIGHT Ambulation Assist: Mod A Device:RW  Distance:  3x75  Address: indicate with an x Gait controlled environment: x Gait home environment:  Balance: X - dynamic tasks including ball toss Endurance: x- Nustep x4min level 5 Postural control:x- Standing ball toss with close S Cognition:  Coordination:x  Safety: x Stairs: X 12x with 1 rail and min A, safety cues for foot on step  Comments: Seated rest with bil UEs rowing with 1# bar, LAQ and marching bil 2x10 with 3# weights. Pt  continues to be limited by decreased R weight shift and L lateral lean, but able to adjust with multi-modal cues to shift to target, improves with practice.        Therapy Documentation Precautions:  Precautions Precautions: Fall Precaution Comments: L-lean/LOB Restrictions Weight Bearing Restrictions: No General:   Vital Signs: Therapy Vitals Temp: 98.4 F (36.9 C) Temp Source: Oral Pulse Rate: 72 Resp: 18 BP: (!) 154/103 mmHg Patient Position (if appropriate): Lying Oxygen Therapy SpO2: 98 % O2 Device: Not Delivered Pain: none    See Function Navigator for Current Functional Status.   Therapy/Group: Individual Therapy and group therapy  Kolette Vey Soundra Pilon, PT, DPT  12/03/2015, 7:10 AM
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
## 2015-12-04 ENCOUNTER — Inpatient Hospital Stay (HOSPITAL_COMMUNITY): Payer: Medicare Other

## 2015-12-04 LAB — GLUCOSE, CAPILLARY
GLUCOSE-CAPILLARY: 158 mg/dL — AB (ref 65–99)
GLUCOSE-CAPILLARY: 154 mg/dL — AB (ref 65–99)
GLUCOSE-CAPILLARY: 155 mg/dL — AB (ref 65–99)
Glucose-Capillary: 177 mg/dL — ABNORMAL HIGH (ref 65–99)

## 2015-12-04 NOTE — Progress Notes (Signed)
Patient vomiting clear liquid last three days mid-morning. Not eating breakfast. Does not eat breakfast at home. Zofran improves symptoms.
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
## 2015-12-04 NOTE — Progress Notes (Signed)
Occupational Therapy Note  Patient Details  Name: ALFONS RAMAMURTHY MRN: [id number] Date of Birth: 10-22-1950  Today's Date: 12/04/2015 OT Concurrent Time: 1100-1200 OT Concurrent Time Calculation (min): 60 min  Pt denied pain Concurrent therapy  Pt engaged in dynamic standing tasks while using his LUE to retrieve objects in all visual planes and return objects to original place.  Pt required steady A during tasks.  Pt required rest breaks X 3.  Pt noted with a slight lean to his left when starting to fatigue during standing tasks.  Pt c/o LUE fatigue nearing the end of the session.  Pt propelled his w/c back to his room from Day Room with rest breaks X 2.  Pt remained in w/c with family present and all needs within reach. Focus on dynamic standing balance, increased LUE use, w/c mobility, activity tolerance, and safety awareness to increase independence with BADLS.    Leotis Shames [facility] 12/04/2015, 12:19 PM

## 2015-12-05 ENCOUNTER — Inpatient Hospital Stay (HOSPITAL_COMMUNITY): Payer: Medicare Other | Admitting: Speech Pathology

## 2015-12-05 ENCOUNTER — Inpatient Hospital Stay (HOSPITAL_COMMUNITY): Payer: Medicare Other | Admitting: Occupational Therapy

## 2015-12-05 ENCOUNTER — Inpatient Hospital Stay (HOSPITAL_COMMUNITY): Payer: Self-pay

## 2015-12-05 DIAGNOSIS — I6302 Cerebral infarction due to thrombosis of basilar artery: Secondary | ICD-10-CM

## 2015-12-05 LAB — GLUCOSE, CAPILLARY
GLUCOSE-CAPILLARY: 144 mg/dL — AB (ref 65–99)
GLUCOSE-CAPILLARY: 237 mg/dL — AB (ref 65–99)
GLUCOSE-CAPILLARY: 150 mg/dL — AB (ref 65–99)
GLUCOSE-CAPILLARY: 198 mg/dL — AB (ref 65–99)

## 2015-12-05 MED ORDER — BACLOFEN 5 MG HALF TABLET
15.0000 mg | ORAL_TABLET | Freq: Four times a day (QID) | ORAL | Status: DC
  Administered 2015-12-05 – 2015-12-07 (×7): 15 mg via ORAL
  Filled 2015-12-05 (×8): qty 1

## 2015-12-05 NOTE — Progress Notes (Signed)
Subjective/Complaints: Occ Nausea and vomiting in ams  Still with hiccups ROS- no CP/SOB/ , N/V/D, no limb or spine pain Objective: Vital Signs: Blood pressure 136/90, pulse 64, temperature 97.8 F (36.6 C), temperature source Oral, resp. rate 18, height 5\' 8"  (1.727 m), weight 102.15 kg (225 lb 3.2 oz), SpO2 98 %. No results found. Results for orders placed or performed during the hospital encounter of 12/01/15 (from the past 72 hour(s))  Glucose, capillary     Status: Abnormal   Collection Time: 12/02/15  6:39 AM  Result Value Ref Range   Glucose-Capillary 152 (H) 65 - 99 mg/dL  Glucose, capillary     Status: Abnormal   Collection Time: 12/02/15 11:25 AM  Result Value Ref Range   Glucose-Capillary 164 (H) 65 - 99 mg/dL  Glucose, capillary     Status: Abnormal   Collection Time: 12/02/15  4:49 PM  Result Value Ref Range   Glucose-Capillary 151 (H) 65 - 99 mg/dL  Glucose, capillary     Status: Abnormal   Collection Time: 12/02/15  8:45 PM  Result Value Ref Range   Glucose-Capillary 173 (H) 65 - 99 mg/dL  Glucose, capillary     Status: Abnormal   Collection Time: 12/03/15  6:38 AM  Result Value Ref Range   Glucose-Capillary 152 (H) 65 - 99 mg/dL  Glucose, capillary     Status: Abnormal   Collection Time: 12/03/15 12:00 PM  Result Value Ref Range   Glucose-Capillary 211 (H) 65 - 99 mg/dL  Glucose, capillary     Status: Abnormal   Collection Time: 12/03/15  4:41 PM  Result Value Ref Range   Glucose-Capillary 140 (H) 65 - 99 mg/dL  Glucose, capillary     Status: Abnormal   Collection Time: 12/03/15  8:43 PM  Result Value Ref Range   Glucose-Capillary 187 (H) 65 - 99 mg/dL  Glucose, capillary     Status: Abnormal   Collection Time: 12/04/15  6:50 AM  Result Value Ref Range   Glucose-Capillary 158 (H) 65 - 99 mg/dL  Glucose, capillary     Status: Abnormal   Collection Time: 12/04/15 12:08 PM  Result Value Ref Range   Glucose-Capillary 177 (H) 65 - 99 mg/dL  Glucose,  capillary     Status: Abnormal   Collection Time: 12/04/15  4:53 PM  Result Value Ref Range   Glucose-Capillary 154 (H) 65 - 99 mg/dL  Glucose, capillary     Status: Abnormal   Collection Time: 12/04/15  8:48 PM  Result Value Ref Range   Glucose-Capillary 155 (H) 65 - 99 mg/dL     HEENT: normal Cardio: RRR and no murmur Resp: CTA B/L and unlabored GI: BS positive and NT, ND Extremity:  Pulses positive and No Edema Skin:   Intact Neuro: Alert/Oriented, Normal Sensory, Normal Motor, Abnormal FMC Ataxic/ dec FMC and Other protracted hiccups Musc/Skel:  Normal Gen NAD   Assessment/Plan: 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 Physiatrist is providing close team supervision and 24 hour management of active medical problems listed below. Physiatrist and rehab team continue to assess barriers to discharge/monitor patient progress toward functional and medical goals. FIM: Function - Bathing Body parts bathed by patient: Right arm, Left arm, Chest, Abdomen, Front perineal area, Buttocks, Right upper leg, Left upper leg, Right lower leg, Back Assist Level: Touching or steadying assistance(Pt > 75%)  Function- Upper Body Dressing/Undressing What  is the patient wearing?: Pull over shirt/dress Pull over shirt/dress - Perfomed by patient: Thread/unthread right sleeve, Thread/unthread left sleeve, Put head through opening, Pull shirt over trunk Function - Lower Body Dressing/Undressing What is the patient wearing?: Pants, Underwear Position: Wheelchair/chair at sink Underwear - Performed by patient: Thread/unthread right underwear leg, Thread/unthread left underwear leg, Pull underwear up/down Pants- Performed by patient: Thread/unthread right pants leg, Thread/unthread left pants leg, Pull pants up/down, Fasten/unfasten pants Socks - Performed by patient: Don/doff left sock,  Don/doff right sock  Function - Toileting Toileting activity did not occur: Refused Toileting steps completed by patient:  (used urinal,no BM) Toileting steps completed by helper: Adjust clothing prior to toileting, Performs perineal hygiene, Adjust clothing after toileting Toileting Assistive Devices: Grab bar or rail Assist level: Touching or steadying assistance (Pt.75%)  Function - Air cabin crew transfer activity did not occur: Refused Toilet transfer assistive device: Grab bar Assist level to toilet: Touching or steadying assistance (Pt > 75%) Assist level from toilet: Touching or steadying assistance (Pt > 75%)  Function - Chair/bed transfer Chair/bed transfer method: Stand pivot Chair/bed transfer assist level: Touching or steadying assistance (Pt > 75%) Chair/bed transfer assistive device: Walker, Armrests Chair/bed transfer details: Verbal cues for sequencing, Verbal cues for technique, Visual cues/gestures for sequencing, Manual facilitation for weight shifting  Function - Locomotion: Wheelchair Will patient use wheelchair at discharge?: No Type: Manual Max wheelchair distance: 150 Assist Level: Supervision or verbal cues Assist Level: Supervision or verbal cues Assist Level: Supervision or verbal cues Turns around,maneuvers to table,bed, and toilet,negotiates 3% grade,maneuvers on rugs and over doorsills: No Function - Locomotion: Ambulation Assistive device: Walker-rolling Max distance: 125 Assist level: Moderate assist (Pt 50 - 74%) Assist level: Touching or steadying assistance (Pt > 75%) Walk 50 feet with 2 turns activity did not occur: Safety/medical concerns Assist level: Moderate assist (Pt 50 - 74%) Walk 150 feet activity did not occur: Safety/medical concerns Walk 10 feet on uneven surfaces activity did not occur: Safety/medical concerns  Function - Comprehension Comprehension: Auditory Comprehension assist level: Understands basic 90% of the  time/cues < 10% of the time  Function - Expression Expression: Verbal Expression assist level: Expresses basic 90% of the time/requires cueing < 10% of the time.  Function - Social Interaction Social Interaction assist level: Interacts appropriately 90% of the time - Needs monitoring or encouragement for participation or interaction.  Function - Problem Solving Problem solving assist level: Solves basic 90% of the time/requires cueing < 10% of the time  Function - Memory Memory assist level: Recognizes or recalls 75 - 89% of the time/requires cueing 10 - 24% of the time Patient normally able to recall (first 3 days only): Current season    Medical Problem List and Plan: 1.  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  DVT Prophylaxis/Anticoagulation: Subcutaneous Lovenox. Monitor platelet counts and any signs of bleeding 3. Pain Management: Tylenol as needed 4. Dysphagia. Dysphagia 3 nectar liquids .Follow-up speech therapy.   5. Neuropsych: This patient is capable of making decisions on his own behalf. 6. Skin/Wound Care: Routine skin checks 7. Fluids/Electrolytes/Nutrition: Routine I&O's with follow-up chemistries 8. Diabetes mellitus peripheral neuropathy. Hemoglobin A1c 6.1.Tradjenta 5 mg daily, Glucophage 1000 mg twice a day. Check blood sugars before meals and at bedtime. Diabetic teaching CBG (last 3)   Recent Labs  12/04/15 1208 12/04/15 1653 12/04/15 2048  GLUCAP 177* 154* 155*  9. Hypertension. Norvasc 10 mg daily, Lopressor 25 mg twice a day, lisinopril 20 mg twice a day. Monitor with increased mobility, no change in meds for now Filed Vitals:   12/04/15 1502 12/05/15 0606  BP: 149/70 136/90  Pulse: 67 64  Temp: 97.8 F (36.6 C) 97.8 F (36.6 C)  Resp: 20 18   10. CAD. Status post PCI. Continue aspirin and Plavix. No chest pain or shortness of breath 11.  Hypothyroidism. Continue Synthroid. 12. Hiccups. Increase baclofen 15mg  to QID 13. Hyperlipidemia. Lipitor   LOS (Days) 4 A FACE TO FACE EVALUATION WAS PERFORMED  KIRSTEINS,ANDREW E 12/05/2015, 6:37 AM

## 2015-12-05 NOTE — Progress Notes (Signed)
Speech Language Pathology Daily Session Note  Patient Details  Name: Derrick [name] MRN: [id number] Date of Birth: [birth year]  Today's Date: 12/05/2015 SLP Individual Time: 1545-1630 SLP Individual Time Calculation (min): 45 min  Short Term Goals: Week 1: SLP Short Term Goal 1 (Week 1): Pt tolerate current diet with use of compensatory strategies without respiratory decline while maintaining nutrition. SLP Short Term Goal 2 (Week 1): Pt demonstrate ability to manage cash with 100% acc mod I. SLP Short Term Goal 3 (Week 1): Pt demonstrate 100% acc with medication management mod I. SLP Short Term Goal 4 (Week 1): Pt demonstrate readiness for repeat objective measure of swallow function. SLP Short Term Goal 5 (Week 1): Pt demonstrate sustained phonation for >4 secs with min A.  Skilled Therapeutic Interventions: Skilled treatment session focused on cognition. SLP facilitated session by providing medication management task. Pt was 100% accurate, independently. Pt also problem solved semi-complex task with Mod I. Pt was returned to room, left in bed with alarms in place and all needs within reach. Continue current plan of care.    Function:   Cognition Comprehension Comprehension assist level: Follows basic conversation/direction with no assist  Expression   Expression assist level: Expresses basic needs/ideas: With no assist  Social Interaction Social Interaction assist level: Interacts appropriately 90% of the time - Needs monitoring or encouragement for participation or interaction.  Problem Solving Problem solving assist level: Solves basic 90% of the time/requires cueing < 10% of the time  Memory Memory assist level: Recognizes or recalls 90% of the time/requires cueing < 10% of the time    Pain Pain Assessment Pain Assessment: No/denies pain Pain Score: 0-No pain  Therapy/Group: Individual Therapy  Derrick [name] 12/05/2015, 4:31 PM

## 2015-12-05 NOTE — Progress Notes (Signed)
Speech Language Pathology Daily Session Note  Patient Details  Name: Derrick [name] MRN: [id number] Date of Birth: Oct 09, 1950  Today's Date: 12/05/2015 SLP Individual Time: 1000-1100 SLP Individual Time Calculation (min): 60 min  Short Term Goals: Week 1: SLP Short Term Goal 1 (Week 1): Pt tolerate current diet with use of compensatory strategies without respiratory decline while maintaining nutrition. SLP Short Term Goal 2 (Week 1): Pt demonstrate ability to manage cash with 100% acc mod I. SLP Short Term Goal 3 (Week 1): Pt demonstrate 100% acc with medication management mod I. SLP Short Term Goal 4 (Week 1): Pt demonstrate readiness for repeat objective measure of swallow function. SLP Short Term Goal 5 (Week 1): Pt demonstrate sustained phonation for >4 secs with min A.  Skilled Therapeutic Interventions: Pt alert and pleasant. Reports eating adequate PO intake with increased time. Trialed with small sips of thins. 1 episode of significant choking episode following hiccup during swallow. Cash management activitiy completed at mod I for 100% acc for bill paying. Hiccups continue to be pt's primary limiting factor for nutrition/safe PO intake.   Function:  Eating Eating   Modified Consistency Diet: Yes Eating Assist Level: More than reasonable amount of time;Swallowing techniques: self managed;Supervision or verbal cues           Cognition Comprehension Comprehension assist level: Follows basic conversation/direction with no assist  Expression   Expression assist level: Expresses basic needs/ideas: With no assist  Social Interaction Social Interaction assist level: Interacts appropriately 90% of the time - Needs monitoring or encouragement for participation or interaction.  Problem Solving Problem solving assist level: Solves basic 90% of the time/requires cueing < 10% of the time  Memory Memory assist level: Recognizes or recalls 90% of the time/requires cueing < 10% of the time     Pain Pain Assessment Pain Assessment: No/denies pain Pain Score: 0-No pain  Therapy/Group: Individual Therapy  Vinetta Bergamo MA, CCC-SLP 12/05/2015, 3:56 PM

## 2015-12-05 NOTE — Progress Notes (Signed)
Occupational Therapy Session Note  Patient Details  Name: Derrick [name] MRN: [id number] Date of Birth: 01/06/1951  Today's Date: 12/05/2015 OT Individual Time: 1445-1530 OT Individual Time Calculation (min): 45 min    Short Term Goals: Week 1:  OT Short Term Goal 1 (Week 1): Pt will complete all bathing and dressing sit to stand with supervision. OT Short Term Goal 2 (Week 1): Pt will complete toilet transfers with supervision using RW to elevated toilet.  OT Short Term Goal 3 (Week 1): Pt will maintain dynamic standing balance with no more than supervision during simple meal prep.   OT Short Term Goal 4 (Week 1): Pt will complete tub/shower transfers with supervision using RW and shower bench.       Skilled Therapeutic Interventions/Progress Updates:    Pt seen for skilled OT to facilitate dynamic standing balance and wt shifts to the L in sitting and standing.  Pt received in recliner, used RW to transfer to W/c, transported to the gym and pt transferred to the mat. Pt worked on lateral reaching to L in sitting without difficult. He held his static stands well even with overhead reaching. Pt was very hesitant to advance L foot out and wt shift onto it. Focused on that this session using UE support on back of sturdy chair to allow pt to step out laterally and forward.  He appears to have knee instability. Pt reports he had some of this PTA.  At end of session, pt practiced basic AROM of knee ext 10 x followed by isometric knee ext with ankle pumps 10x. Encouraged pt to work on this in his room.  Pt taken back to room and transferred back to bed with all needs met. Bed alarm set.    Therapy Documentation Precautions:  Precautions Precautions: Fall Precaution Comments: L-lean/LOB Restrictions Weight Bearing Restrictions: No    Vital Signs: Therapy Vitals Temp: 98.1 F (36.7 C) Temp Source: Oral Pulse Rate: 68 Resp: 16 BP: 132/75 mmHg Patient Position (if appropriate):  Sitting Oxygen Therapy SpO2: 98 % O2 Device: Not Delivered Pain: Pain Assessment Pain Assessment: No/denies pain Pain Score: 0-No pain ADL:  See Function Navigator for Current Functional Status.   Therapy/Group: Individual Therapy  [place] 12/05/2015, 3:55 PM

## 2015-12-05 NOTE — Progress Notes (Addendum)
Physical Therapy Note  Patient Details  Name: Derrick [name] MRN: [id number] Date of Birth: 12-29-1950 Today's Date: 12/05/2015  1300-1400, 60 min individual tx Pain: none reported; pt had hiccups throughout session  Pt donned TEDs and yellow socks in sitting with supervision.  neuromuscular re-education via forced use, manual and VCs, for : W/c propulsion to gym using bil UEs;  trunk shortening/lengthening/rotating in unsupported sitting as he reached laterally L/R out of BOS; prolonged heel cord stretch in standing on wedge x 2 minutes; R/L lateral lean position for pelvic protraction/retraction to increase pelvic dissociation; [place] A exs in standing: heel raises, mini squats, hip abd R/L x 10 each.  Mild L drift noted often; pt unaware. Seated prolonged stretch bil hip ERs using Theraband and Yoga block at feet for positioning.    Gait with grocery cart x 150' without lean noted, but mod cues to decrease UE reliance and relax L shoulder and arm. Gait with RW x 15' x 3.  Gait to return to room with RW, min guard except for turn to L mod assist due to L drift.  Pt left resting in recliner with all needs within reach, quick release belt donned.    Kendra Woolford 12/05/2015, 1:34 PM

## 2015-12-06 ENCOUNTER — Inpatient Hospital Stay (HOSPITAL_COMMUNITY): Payer: Self-pay | Admitting: Occupational Therapy

## 2015-12-06 ENCOUNTER — Inpatient Hospital Stay (HOSPITAL_COMMUNITY): Payer: Medicare Other | Admitting: Speech Pathology

## 2015-12-06 ENCOUNTER — Inpatient Hospital Stay (HOSPITAL_COMMUNITY): Payer: Self-pay | Admitting: Physical Therapy

## 2015-12-06 LAB — TROPONIN I
Troponin I: <0.03 ng/mL (ref <0.031)
Troponin I: <0.03 ng/mL (ref <0.031)

## 2015-12-06 LAB — GLUCOSE, CAPILLARY
GLUCOSE-CAPILLARY: 155 mg/dL — AB (ref 65–99)
GLUCOSE-CAPILLARY: 154 mg/dL — AB (ref 65–99)
Glucose-Capillary: 208 mg/dL — ABNORMAL HIGH (ref 65–99)
Glucose-Capillary: 124 mg/dL — ABNORMAL HIGH (ref 65–99)

## 2015-12-06 NOTE — Progress Notes (Signed)
Occupational Therapy Session Note  Patient Details  Name: Derrick [name] MRN: [id number] Date of Birth: 1950/08/16  Today's Date: 12/06/2015 OT Individual Time: [id number] Individual Time Calculation (min): 75 min    Short Term Goals: Week 1:  OT Short Term Goal 1 (Week 1): Pt will complete all bathing and dressing sit to stand with supervision. OT Short Term Goal 2 (Week 1): Pt will complete toilet transfers with supervision using RW to elevated toilet.  OT Short Term Goal 3 (Week 1): Pt will maintain dynamic standing balance with no more than supervision during simple meal prep.   OT Short Term Goal 4 (Week 1): Pt will complete tub/shower transfers with supervision using RW and shower bench.    Skilled Therapeutic Interventions/Progress Updates:    1:1 self care retraining at sink level - pt's choice today. Focus on sit to stands, functional dynamic standing balance, etc. PT able to perform bathing and dressing at sink level with supervision. Focus on functional ambulation from room to ADL apartment. Pt required min to mod A to weight shift towards the right for balance while advancing left LE.  Couch transfer with supervision with RW. Also educated and return demonstration tub shower transfer with tub bench.  Recommendation for tub bench for access to tub shower. Transitioned to gym to continue to focus on standing balance while working to weight shift off of left Le on to right on [place] in standing progressing from bilateral UE support to no UE support with mod VC and facilitation at hips. Transitioned to parellel bars with rocker block to continue to perform exaggerated  weight shifts/ Required max VC and mod A to maintain balance without UE support.  Pt with improved function ambulation as returned to room with increased/ wider BOS and improved weight shifts with decr facilitation needed.   Therapy Documentation Precautions:  Precautions Precautions: Fall Precaution Comments:  L-lean/LOB Restrictions Weight Bearing Restrictions: No General:   Vital Signs:  Pain:   ADL:   Exercises:   Other Treatments:    See Function Navigator for Current Functional Status.   Therapy/Group: Individual Therapy  Willeen Cass [facility] 12/06/2015, 9:51 AM

## 2015-12-06 NOTE — Progress Notes (Signed)
Physical Therapy Session Note  Patient Details  Name: Derrick [name] MRN: [id number] Date of Birth: Nov 20, 1950  Today's Date: 12/06/2015 PT Individual Time: [id number] PT Individual Time Calculation (min): 30 min   Short Term Goals: Week 1:  PT Short Term Goal 1 (Week 1): STG=LTG due to LOS, progress towards Mod I for all mobility  Skilled Therapeutic Interventions/Progress Updates:   Pt received in recliner.  Pt reporting HA, feeling lightheaded/dizzy, "woozy" and very fatigued.  He feels that the medication he is taking for hiccups is causing him to be nauseous and weak/fatigued.  Pt also asking for ice chips.  Pt's vitals assessed due to symptoms with vitals recorded below.  Alerted RN of vitals and pt's symptoms, RN alerted PA who ordered EKG.  Returned to room and assisted pt with sit > stand from recliner with RW and min A and transfer recliner > bed with forwards, lateral and retro stepping with RW with min-mod A and verbal/visual cues for safety, weight shifting and sequencing with RW.  Pt performed sit > supine with supervision-min A.  Consulted with speech therapist who confirmed that pt is safe to perform ice chips with SLP ONLY right now due to silent aspiration.  Alerted pt and RN that pt should have ice chips with SLP only right now (due to pt asking multiple staff members for ice); swallowing recommendations above bed also updated.  RN and NT present to perform EKG.  Pt missed 30 minutes pm PT due to EKG and increased fatigue.  Will f/u tomorrow.      Therapy Documentation Precautions:  Precautions Precautions: Fall Precaution Comments: L-lean/LOB Restrictions Weight Bearing Restrictions: No General: PT Amount of Missed Time (min): 30 Minutes PT Missed Treatment Reason: Other (Comment) (EKG) Vital Signs: Therapy Vitals Temp: 98.2 F (36.8 C) Temp Source: Oral Pulse Rate: (!) 42 Resp: 16 BP: (!) 167/88 mmHg Patient Position (if appropriate): Sitting Oxygen Therapy SpO2:  100 % O2 Device: Not Delivered Pain: Pain Assessment Pain Assessment: No/denies pain Pain Score: 5  Pain Type: Acute pain Pain Location: Head Pain Descriptors / Indicators: Headache Pain Onset: Progressive Pain Intervention(s): RN made aware   See Function Navigator for Current Functional Status.   Therapy/Group: Individual Therapy  Raylene Everts [facility] 12/06/2015, 4:09 PM

## 2015-12-06 NOTE — Progress Notes (Signed)
Subjective/Complaints: No hiccups today amb to bathroom without gait belt    ROS- no CP/SOB/ , N/V/D, no limb or spine pain Objective: Vital Signs: Blood pressure 132/98, pulse 64, temperature 97.6 F (36.4 C), temperature source Oral, resp. rate 16, height 5\' 8"  (1.727 m), weight 102.921 kg (226 lb 14.4 oz), SpO2 100 %. No results found. Results for orders placed or performed during the hospital encounter of 12/01/15 (from the past 72 hour(s))  Glucose, capillary     Status: Abnormal   Collection Time: 12/03/15 12:00 PM  Result Value Ref Range   Glucose-Capillary 211 (H) 65 - 99 mg/dL  Glucose, capillary     Status: Abnormal   Collection Time: 12/03/15  4:41 PM  Result Value Ref Range   Glucose-Capillary 140 (H) 65 - 99 mg/dL  Glucose, capillary     Status: Abnormal   Collection Time: 12/03/15  8:43 PM  Result Value Ref Range   Glucose-Capillary 187 (H) 65 - 99 mg/dL  Glucose, capillary     Status: Abnormal   Collection Time: 12/04/15  6:50 AM  Result Value Ref Range   Glucose-Capillary 158 (H) 65 - 99 mg/dL  Glucose, capillary     Status: Abnormal   Collection Time: 12/04/15 12:08 PM  Result Value Ref Range   Glucose-Capillary 177 (H) 65 - 99 mg/dL  Glucose, capillary     Status: Abnormal   Collection Time: 12/04/15  4:53 PM  Result Value Ref Range   Glucose-Capillary 154 (H) 65 - 99 mg/dL  Glucose, capillary     Status: Abnormal   Collection Time: 12/04/15  8:48 PM  Result Value Ref Range   Glucose-Capillary 155 (H) 65 - 99 mg/dL  Glucose, capillary     Status: Abnormal   Collection Time: 12/05/15  6:45 AM  Result Value Ref Range   Glucose-Capillary 144 (H) 65 - 99 mg/dL  Glucose, capillary     Status: Abnormal   Collection Time: 12/05/15 11:29 AM  Result Value Ref Range   Glucose-Capillary 237 (H) 65 - 99 mg/dL  Glucose, capillary     Status: Abnormal   Collection Time: 12/05/15  4:33 PM  Result Value Ref Range   Glucose-Capillary 150 (H) 65 - 99 mg/dL   Glucose, capillary     Status: Abnormal   Collection Time: 12/05/15  8:52 PM  Result Value Ref Range   Glucose-Capillary 198 (H) 65 - 99 mg/dL  Glucose, capillary     Status: Abnormal   Collection Time: 12/06/15  7:04 AM  Result Value Ref Range   Glucose-Capillary 155 (H) 65 - 99 mg/dL     HEENT: normal Cardio: RRR and no murmur Resp: CTA B/L and unlabored GI: BS positive and NT, ND Extremity:  Pulses positive and No Edema Skin:   Intact Neuro: Alert/Oriented, Normal Sensory, Normal Motor, Abnormal FMC Ataxic/ dec FMC and Other protracted hiccups Musc/Skel:  Normal Gen NAD   Assessment/Plan: 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 Physiatrist is providing close team supervision and 24 hour management of active medical problems listed below. Physiatrist and rehab team continue to assess barriers to discharge/monitor patient progress toward functional and medical goals. FIM: Function - Bathing Body parts bathed by patient: Right arm, Left arm, Chest, Abdomen, Front perineal area, Buttocks, Right upper leg, Left upper leg, Right lower leg, Back Assist Level: Touching or steadying assistance(Pt > 75%)  Function- Upper Body  Dressing/Undressing What is the patient wearing?: Pull over shirt/dress Pull over shirt/dress - Perfomed by patient: Thread/unthread right sleeve, Thread/unthread left sleeve, Put head through opening, Pull shirt over trunk Function - Lower Body Dressing/Undressing What is the patient wearing?: Pants, Underwear Position: Wheelchair/chair at sink Underwear - Performed by patient: Thread/unthread right underwear leg, Thread/unthread left underwear leg, Pull underwear up/down Pants- Performed by patient: Thread/unthread right pants leg, Thread/unthread left pants leg, Pull pants up/down, Fasten/unfasten pants Socks - Performed by patient: Don/doff  left sock, Don/doff right sock  Function - Toileting Toileting activity did not occur: Refused Toileting steps completed by patient:  (used urinal,no BM) Toileting steps completed by helper: Adjust clothing prior to toileting, Performs perineal hygiene, Adjust clothing after toileting Toileting Assistive Devices: Grab bar or rail Assist level: Touching or steadying assistance (Pt.75%)  Function - Air cabin crew transfer activity did not occur: Refused Toilet transfer assistive device: Grab bar Assist level to toilet: Touching or steadying assistance (Pt > 75%) Assist level from toilet: Touching or steadying assistance (Pt > 75%)  Function - Chair/bed transfer Chair/bed transfer method: Stand pivot Chair/bed transfer assist level: Touching or steadying assistance (Pt > 75%) Chair/bed transfer assistive device: Walker, Armrests Chair/bed transfer details: Verbal cues for sequencing, Verbal cues for technique, Visual cues/gestures for sequencing, Manual facilitation for weight shifting, Verbal cues for safe use of DME/AE  Function - Locomotion: Wheelchair Will patient use wheelchair at discharge?: No Type: Manual Max wheelchair distance: 150 Assist Level: Supervision or verbal cues Assist Level: Supervision or verbal cues Assist Level: Supervision or verbal cues Turns around,maneuvers to table,bed, and toilet,negotiates 3% grade,maneuvers on rugs and over doorsills: No Function - Locomotion: Ambulation Assistive device: Walker-rolling Max distance: 150 Assist level: Moderate assist (Pt 50 - 74%) Assist level: Touching or steadying assistance (Pt > 75%) Walk 50 feet with 2 turns activity did not occur: Safety/medical concerns Assist level: Moderate assist (Pt 50 - 74%) Walk 150 feet activity did not occur: Safety/medical concerns Assist level: Moderate assist (Pt 50 - 74%) Walk 10 feet on uneven surfaces activity did not occur: Safety/medical concerns  Function -  Comprehension Comprehension: Auditory Comprehension assist level: Follows basic conversation/direction with no assist  Function - Expression Expression: Verbal Expression assist level: Expresses basic needs/ideas: With no assist  Function - Social Interaction Social Interaction assist level: Interacts appropriately 90% of the time - Needs monitoring or encouragement for participation or interaction.  Function - Problem Solving Problem solving assist level: Solves basic 90% of the time/requires cueing < 10% of the time  Function - Memory Memory assist level: Recognizes or recalls 50 - 74% of the time/requires cueing 25 - 49% of the time Patient normally able to recall (first 3 days only): Current season, Location of own room, Staff names and faces, That he or she is in a hospital    Medical Problem List and Plan: 1.  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  DVT Prophylaxis/Anticoagulation: Subcutaneous Lovenox. Monitor platelet counts and any signs of bleeding 3. Pain Management: Tylenol as needed 4. Dysphagia. Dysphagia 3 nectar liquids .Follow-up speech therapy.   5. Neuropsych: This patient is capable of making decisions on his own behalf. 6. Skin/Wound Care: Routine skin checks 7. Fluids/Electrolytes/Nutrition: Routine I&O's with follow-up chemistries 8. Diabetes mellitus peripheral neuropathy. Hemoglobin A1c 6.1.Tradjenta 5 mg daily, Glucophage 1000 mg twice a day. Check blood sugars before meals and at bedtime. Diabetic teaching CBG (  last 3)   Recent Labs  12/05/15 1633 12/05/15 2052 12/06/15 0704  GLUCAP 150* 198* 155*    9. Hypertension. Norvasc 10 mg daily, Lopressor 25 mg twice a day, lisinopril 20 mg twice a day. Monitor with increased mobility, no change in meds for now Filed Vitals:   12/05/15 2115 12/06/15 0549  BP: 148/84 132/98  Pulse: 80 64  Temp:  97.6 F (36.4 C)  Resp:  16    10. CAD. Status post PCI. Continue aspirin and Plavix. No chest pain or shortness of breath, monitor with increased walking distance 11. Hypothyroidism. Continue Synthroid. 12. Hiccups. Increased baclofen 15mg  to QID on 5/22, improved 13. Hyperlipidemia. Lipitor   LOS (Days) 5 A FACE TO FACE EVALUATION WAS PERFORMED  KIRSTEINS,ANDREW E 12/06/2015, 7:19 AM

## 2015-12-06 NOTE — Progress Notes (Signed)
Speech Language Pathology Daily Session Note  Patient Details  Name: Derrick [name] MRN: [id number] Date of Birth: 02/25/1951  Today's Date: 12/06/2015 SLP Individual Time: 1100-1200 SLP Individual Time Calculation (min): 60 min  Short Term Goals: Week 1: SLP Short Term Goal 1 (Week 1): Pt tolerate current diet with use of compensatory strategies without respiratory decline while maintaining nutrition. SLP Short Term Goal 2 (Week 1): Pt demonstrate ability to manage cash with 100% acc mod I. SLP Short Term Goal 3 (Week 1): Pt demonstrate 100% acc with medication management mod I. SLP Short Term Goal 4 (Week 1): Pt demonstrate readiness for repeat objective measure of swallow function. SLP Short Term Goal 5 (Week 1): Pt demonstrate sustained phonation for >4 secs with min A.  Skilled Therapeutic Interventions:  Pt was seen for skilled ST targeting goals for dysphagia and communication.  SLP facilitated the session with trials of ice chips and thin liquids to continue working towards liquids advancement.  Pt consumed ice chips with multiple audible swallows but no overt s/s of aspiration.  Trials of thin liquids via teaspoon elicited frequent eructation which were mitigated with warm liquids versus cold.  Cup sips of thin liquids resulted in consistent throat clearing.  Pt also reports intermittent solid food dysphagia with meats during meals.  Recommend that pt remain on dys 3, nectar thick liquids with full supervision for use of swallowing precautions.  SLP also facilitated the session with structured tasks targeting sustained phonation.  Pt was able to sustain /ah/ for 4 second x1 but was unable to replicate results on consecutive trials.  His average for sustained phonation was ~2 seconds.  Pt also able to sustain voicing when counting from 1-20 with distant supervision.  Pt was left in recliner with call bell within reach.  Continue per current plan of care.    Function:  Eating Eating                 Cognition Comprehension Comprehension assist level: Follows complex conversation/direction with no assist  Expression   Expression assist level: Expresses basic 90% of the time/requires cueing < 10% of the time.  Social Interaction Social Interaction assist level: Interacts appropriately with others - No medications needed.  Problem Solving Problem solving assist level: Solves basic 90% of the time/requires cueing < 10% of the time  Memory Memory assist level: Recognizes or recalls 90% of the time/requires cueing < 10% of the time    Pain Pain Assessment Pain Assessment: No/denies pain  Therapy/Group: Individual Therapy  Erlene Devita, Selinda Orion 12/06/2015, 12:37 PM

## 2015-12-07 ENCOUNTER — Inpatient Hospital Stay (HOSPITAL_COMMUNITY): Payer: Medicare Other | Admitting: Occupational Therapy

## 2015-12-07 ENCOUNTER — Inpatient Hospital Stay (HOSPITAL_COMMUNITY): Payer: Medicare Other | Admitting: Speech Pathology

## 2015-12-07 ENCOUNTER — Inpatient Hospital Stay (HOSPITAL_COMMUNITY): Payer: Medicare Other

## 2015-12-07 LAB — GLUCOSE, CAPILLARY
GLUCOSE-CAPILLARY: 153 mg/dL — AB (ref 65–99)
GLUCOSE-CAPILLARY: 120 mg/dL — AB (ref 65–99)
Glucose-Capillary: 155 mg/dL — ABNORMAL HIGH (ref 65–99)
Glucose-Capillary: 173 mg/dL — ABNORMAL HIGH (ref 65–99)

## 2015-12-07 LAB — TROPONIN I

## 2015-12-07 MED ORDER — METOPROLOL TARTRATE 12.5 MG HALF TABLET
12.5000 mg | ORAL_TABLET | Freq: Two times a day (BID) | ORAL | Status: DC
  Administered 2015-12-07 (×2): 12.5 mg via ORAL
  Filled 2015-12-07 (×2): qty 1

## 2015-12-07 MED ORDER — BACLOFEN 10 MG PO TABS
15.0000 mg | ORAL_TABLET | Freq: Four times a day (QID) | ORAL | Status: DC | PRN
  Administered 2015-12-07: 15 mg via ORAL
  Filled 2015-12-07: qty 1

## 2015-12-07 NOTE — Progress Notes (Signed)
Physical Therapy Weekly Progress Note  Patient Details  Name: AMIRI RIECHERS MRN: [id number] Date of Birth: [birth year]  Beginning of progress report period: 12/01/15 End of progress report period: 12/06/25  Today's Date: 12/07/2015   -     Patient has met 0 of 9 short term/ LTGs.    Patient continues to demonstrate the following deficits: activity tolerance, balance, midline orientation, coordination,  and therefore will continue to benefit from skilled PT intervention to enhance overall performance with activity tolerance, balance, ability to compensate for deficits, functional use of  left upper extremity and left lower extremity, attention, awareness and coordination.  Patient progressing toward long term goals..  Continue plan of care.  PT Short Term Goals Week 1:  PT Short Term Goal 1 (Week 1): STG=LTG due to LOS, progress towards Mod I for all mobility  Skilled Therapeutic Interventions/Progress Updates: pt continues to have bradycardia intermittently.  MD is adjusting cardiac meds.  Pt did not have hiccups during session today.  He demonstrates L drift during gait and transitional movements, but it has improved since admission.  L knee buckles occasionally during gait, also affecting his balance.      Therapy Documentation Precautions:  Precautions Precautions: Fall Precaution Comments: L-lean/LOB Restrictions Weight Bearing Restrictions: No   Vital Signs: Therapy Vitals Pulse Rate: 46 after 7 minutes on NuStep at level 4 BP: 129/70 mmHg Patient Position (if appropriate): Lying Oxygen Therapy SpO2: 99 % O2 Device: Not Delivered Pain: Pain Assessment Pain Assessment: No/denies pain      See Function Navigator for Current Functional Status.  Therapy/Group: Individual Therapy  Xzayvier Fagin 12/07/2015, 4:38 PM

## 2015-12-07 NOTE — Progress Notes (Signed)
Occupational Therapy Session Note  Patient Details  Name: Derrick [name] MRN: [id number] Date of Birth: 03/05/1951  Today's Date: 12/07/2015 OT Individual Time: 7902-4097 OT Individual Time Calculation (min): 28 min    Short Term Goals: Week 1:  OT Short Term Goal 1 (Week 1): Pt will complete all bathing and dressing sit to stand with supervision. OT Short Term Goal 1 - Progress (Week 1): Met OT Short Term Goal 2 (Week 1): Pt will complete toilet transfers with supervision using RW to elevated toilet.  OT Short Term Goal 2 - Progress (Week 1): Not met OT Short Term Goal 3 (Week 1): Pt will maintain dynamic standing balance with no more than supervision during simple meal prep.   OT Short Term Goal 3 - Progress (Week 1): Not met OT Short Term Goal 4 (Week 1): Pt will complete tub/shower transfers with supervision using RW and shower bench.   OT Short Term Goal 4 - Progress (Week 1): Not met  Skilled Therapeutic Interventions/Progress Updates:    Pt ambulated to the [facility] gym with min assist and no assistive device.  Utilized the [employer] for static standing balance and eye hand coordination as well as divided attention.  He demonstrated good static standing balance in a dark room.  No LOB noted during eval or treatment.  He exhibited reaction time of less than 2 seconds during testing with treatment focusing on reaction time of less than 2 seconds while dividing attention between 2 digit numbers flashing in the T-scope.  He was 8/10 on the correct digits with 75% accuracy on pressing red buttons before they go out.  Pt ambulated back to the room with min assist to complete session.  Pt left on the toilet with nursing tech notified and pt instructed to push button when he is ready to get up.   Therapy Documentation Precautions:  Precautions Precautions: Fall Precaution Comments: L-lean/LOB Restrictions Weight Bearing Restrictions: No  Pain: Pain Assessment Pain Assessment: No/denies  pain ADL: See Function Navigator for Current Functional Status.   Therapy/Group: Individual Therapy  Taje Littler OTR/L 12/07/2015, 2:33 PM

## 2015-12-07 NOTE — Patient Care Conference (Signed)
Inpatient RehabilitationTeam Conference and Plan of Care Update Date: 12/07/2015   Time: 10:45 AM    Patient Name: Derrick [name]      Medical Record Number: [id number]  Date of Birth: April 21, 1951 Sex: Male         Room/Bed: 4M07C/4M07C-01 Payor Info: Payor: MEDICARE / Plan: MEDICARE PART A AND B / Product Type: *No Product type* /    Admitting Diagnosis: CVA  Admit Date/Time:  12/01/2015  5:10 PM Admission Comments: No comment available   Primary Diagnosis:  Stroke, Wallenberg's syndrome Principal Problem: Stroke, Wallenberg's syndrome  Patient Active Problem List   Diagnosis Date Noted  . 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 Dysphonia 12/01/2015  . 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 CVA (cerebral infarction) 11/25/2015  . History of colon cancer   . Personal history of colon cancer 10/19/2015  . Elevated TSH 08/04/2015  . Cancer of ascending colon ([place]) 09/06/2014  . CAD S/P percutaneous coronary angioplasty 08/17/2014  . Essential hypertension 08/17/2014  . Mixed hyperlipidemia 08/17/2014  . Type 2 diabetes mellitus ([place]) 08/17/2014  . History of stroke 08/17/2014  . Major depression ([place]) 04/20/2014  . Hx of adenomatous colonic polyps 03/19/2012    Expected Discharge Date: Expected Discharge Date: 12/13/15  Team Members Present: Physician leading conference: Dr. Alysia Penna Social Worker Present: Ovidio Kin, LCSW Nurse Present: Dorien Chihuahua, RN PT Present: Georjean Mode, PT;Rodney Lajuana Matte, PT OT Present: Clyda Greener, OT SLP Present: Windell Moulding, SLP PPS Coordinator present : Daiva Nakayama, RN, CRRN     Current Status/Progress Goal Weekly Team Focus  Medical   Pt without pain, dizziness  cardiac stabilityprior to d/c  d/c planning   Bowel/Bladder   continent Bowel/bladder; LBM 5/23  Patient to remain continet of Bow[facility]  monitor bowel/bladded function and timed toiet patient     Swallow/Nutrition/ Hydration   Dys 3, nectar thick; full supervision    mod I   trials of ice chips to continue working towards repeat objective assessment    ADL's   supervision to min A with basic ADLs  mod I goals  dynamic standing balance, balance reactions, functional ambulation , activity tolerance   Mobility   min-mod A; still with L lateropulsion   Mod I overall   Reorientation to midline, balance, decreasing assistance with gait and transfers   Communication   hoarse vocal quality s/p CVA, impacting intelligibility in conversations    mod I   phonation    Safety/Cognition/ Behavioral Observations  higher level executive function deficits   supervision   medication and money management    Pain   Denied any pain or discomfort  <3  Assess and treat any pain or discomfort   Skin   No new skin issue found  Assess patient skin q shift and as needed  Patient skin to remain free from new breakdown/infection      *See Care Plan and progress notes for long and short-term goals.  Barriers to Discharge: still bradycaric    Possible Resolutions to Barriers:  adjust meds    Discharge Planning/Teaching Needs:  Daughter and granddaughter live with pt and can provide assistance. Daughter does take care of 22 month old daughter. Pt has 10 children to assist him      Team Discussion:  Goals supervision-mod/i level. Working on balance, swallowing and dizziness. L-knee buckles pt is aware it will happen. MD adjusting medicines-decreased lopressor and baclofen. Fees later this week,  hopefully can upgrade diet.  Revisions to Treatment Plan:  DC Tuesday 5/30   Continued Need for Acute Rehabilitation Level of Care: The patient requires daily medical management by a physician with specialized training in physical medicine and rehabilitation for the following conditions: Daily direction of a multidisciplinary physical rehabilitation program to ensure safe treatment while eliciting the highest  outcome that is of practical value to the patient.: Yes Daily medical management of patient stability for increased activity during participation in an intensive rehabilitation regime.: Yes Daily analysis of laboratory values and/or radiology reports with any subsequent need for medication adjustment of medical intervention for : Cardiac problems;Neurological problems  Elease Hashimoto 12/07/2015, 12:43 PM

## 2015-12-07 NOTE — Progress Notes (Signed)
Subjective/Complaints: Had dizziness yest but this is better today, per RN EKG and troponins ordered by PA yesterday Eating breakfast I reviewed results No CP or SOB   ROS- no , N/V/D, no limb or spine pain Objective: Vital Signs: Blood pressure 111/83, pulse 65, temperature 98 F (36.7 C), temperature source Oral, resp. rate 18, height '5\' 8"'  (1.727 m), weight 103.647 kg (228 lb 8 oz), SpO2 97 %. No results found. Results for orders placed or performed during the hospital encounter of 12/01/15 (from the past 72 hour(s))  Glucose, capillary     Status: Abnormal   Collection Time: 12/04/15 12:08 PM  Result Value Ref Range   Glucose-Capillary 177 (H) 65 - 99 mg/dL  Glucose, capillary     Status: Abnormal   Collection Time: 12/04/15  4:53 PM  Result Value Ref Range   Glucose-Capillary 154 (H) 65 - 99 mg/dL  Glucose, capillary     Status: Abnormal   Collection Time: 12/04/15  8:48 PM  Result Value Ref Range   Glucose-Capillary 155 (H) 65 - 99 mg/dL  Glucose, capillary     Status: Abnormal   Collection Time: 12/05/15  6:45 AM  Result Value Ref Range   Glucose-Capillary 144 (H) 65 - 99 mg/dL  Glucose, capillary     Status: Abnormal   Collection Time: 12/05/15 11:29 AM  Result Value Ref Range   Glucose-Capillary 237 (H) 65 - 99 mg/dL  Glucose, capillary     Status: Abnormal   Collection Time: 12/05/15  4:33 PM  Result Value Ref Range   Glucose-Capillary 150 (H) 65 - 99 mg/dL  Glucose, capillary     Status: Abnormal   Collection Time: 12/05/15  8:52 PM  Result Value Ref Range   Glucose-Capillary 198 (H) 65 - 99 mg/dL  Glucose, capillary     Status: Abnormal   Collection Time: 12/06/15  7:04 AM  Result Value Ref Range   Glucose-Capillary 155 (H) 65 - 99 mg/dL  Glucose, capillary     Status: Abnormal   Collection Time: 12/06/15 11:50 AM  Result Value Ref Range   Glucose-Capillary 208 (H) 65 - 99 mg/dL  Troponin I (q 6hr x 3)     Status: None   Collection Time: 12/06/15   4:28 PM  Result Value Ref Range   Troponin I <0.03 <0.031 ng/mL    Comment:        NO INDICATION OF MYOCARDIAL INJURY.   Glucose, capillary     Status: Abnormal   Collection Time: 12/06/15  4:58 PM  Result Value Ref Range   Glucose-Capillary 124 (H) 65 - 99 mg/dL  Glucose, capillary     Status: Abnormal   Collection Time: 12/06/15  8:43 PM  Result Value Ref Range   Glucose-Capillary 154 (H) 65 - 99 mg/dL  Troponin I (q 6hr x 3)     Status: None   Collection Time: 12/06/15  9:20 PM  Result Value Ref Range   Troponin I <0.03 <0.031 ng/mL    Comment:        NO INDICATION OF MYOCARDIAL INJURY.   Troponin I (q 6hr x 3)     Status: None   Collection Time: 12/07/15  4:00 AM  Result Value Ref Range   Troponin I <0.03 <0.031 ng/mL    Comment:        NO INDICATION OF MYOCARDIAL INJURY.   Glucose, capillary     Status: Abnormal   Collection Time: 12/07/15  6:45 AM  Result Value Ref Range   Glucose-Capillary 155 (H) 65 - 99 mg/dL     HEENT: normal Cardio: Ireg Irreg and no murmur Resp: CTA B/L and unlabored GI: BS positive and NT, ND Extremity:  Pulses positive and No Edema Skin:   Intact Neuro: Alert/Oriented, Normal Sensory, Normal Motor, Abnormal FMC Ataxic/ dec FMC and Other protracted hiccups Musc/Skel:  Normal Gen NAD   Assessment/Plan: 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 Physiatrist is providing close team supervision and 24 hour management of active medical problems listed below. Physiatrist and rehab team continue to assess barriers to discharge/monitor patient progress toward functional and medical goals. FIM: Function - Bathing Position: Wheelchair/chair at sink Body parts bathed by patient: Right arm, Left arm, Chest, Abdomen, Front perineal area, Buttocks, Right upper leg, Left upper leg, Right lower leg, Back, Left lower leg Assist  Level: Supervision or verbal cues  Function- Upper Body Dressing/Undressing What is the patient wearing?: Pull over shirt/dress Pull over shirt/dress - Perfomed by patient: Thread/unthread right sleeve, Thread/unthread left sleeve, Put head through opening, Pull shirt over trunk Assist Level: Set up (per OT note) Set up : To obtain clothing/put away Function - Lower Body Dressing/Undressing What is the patient wearing?: Pants, Underwear, Ted Hose, Non-skid slipper socks Position: Wheelchair/chair at [employer] - Performed by patient: Thread/unthread right underwear leg, Thread/unthread left underwear leg, Pull underwear up/down Pants- Performed by patient: Thread/unthread right pants leg, Thread/unthread left pants leg, Pull pants up/down, Fasten/unfasten pants Non-skid slipper socks- Performed by patient: Don/doff right sock, Don/doff left sock Socks - Performed by patient: Don/doff left sock, Don/doff right sock TED Hose - Performed by patient: Don/doff right TED hose, Don/doff left TED hose Assist for footwear: Supervision/touching assist Assist for lower body dressing: Supervision or verbal cues (per OT note)  Function - Toileting Toileting activity did not occur: Refused Toileting steps completed by patient: Performs perineal hygiene Toileting steps completed by helper: Adjust clothing prior to toileting, Adjust clothing after toileting Toileting Assistive Devices: Grab bar or rail Assist level: Touching or steadying assistance (Pt.75%)  Function - Air cabin crew transfer activity did not occur: Refused Toilet transfer assistive device: Grab bar Assist level to toilet: Moderate assist (Pt 50 - 74%/lift or lower) Assist level from toilet: Moderate assist (Pt 50 - 74%/lift or lower)  Function - Chair/bed transfer Chair/bed transfer method: Ambulatory, Stand pivot Chair/bed transfer assist level: Touching or steadying assistance (Pt > 75%) Chair/bed transfer assistive  device: Armrests, Walker Chair/bed transfer details: Verbal cues for sequencing, Verbal cues for technique, Visual cues/gestures for sequencing, Manual facilitation for weight shifting, Verbal cues for safe use of DME/AE  Function - Locomotion: Wheelchair Will patient use wheelchair at discharge?: No Type: Manual Max wheelchair distance: 150 Assist Level: Supervision or verbal cues Assist Level: Supervision or verbal cues Assist Level: Supervision or verbal cues Turns around,maneuvers to table,bed, and toilet,negotiates 3% grade,maneuvers on rugs and over doorsills: No Function - Locomotion: Ambulation Assistive device: Walker-rolling Max distance: 150 Assist level: Moderate assist (Pt 50 - 74%) Assist level: Touching or steadying assistance (Pt > 75%) Walk 50 feet with 2 turns activity did not occur: Safety/medical concerns Assist level: Moderate assist (Pt 50 - 74%) Walk 150 feet activity did not occur: Safety/medical concerns Assist level: Moderate assist (Pt 50 - 74%) Walk 10 feet on uneven surfaces activity did not occur: Safety/medical concerns  Function - Comprehension Comprehension: Auditory  Comprehension assist level: Understands basic 90% of the time/cues < 10% of the time  Function - Expression Expression: Verbal Expression assist level: Expresses basic 90% of the time/requires cueing < 10% of the time.  Function - Social Interaction Social Interaction assist level: Interacts appropriately 90% of the time - Needs monitoring or encouragement for participation or interaction.  Function - Problem Solving Problem solving assist level: Solves complex 90% of the time/cues < 10% of the time  Function - Memory Memory assist level: Recognizes or recalls 90% of the time/requires cueing < 10% of the time Patient normally able to recall (first 3 days only): Current season    Medical Problem List and Plan: 1.  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 Formulized individual treatment plan based on medical history, underlying problem and comorbidities. 2.  DVT Prophylaxis/Anticoagulation: Subcutaneous Lovenox. Monitor platelet counts and any signs of bleeding 3. Pain Management: Tylenol as needed 4. Dysphagia. Dysphagia 3 nectar liquids .Follow-up speech therapy.   5. Neuropsych: This patient is capable of making decisions on his own behalf. 6. Skin/Wound Care: Routine skin checks 7. Fluids/Electrolytes/Nutrition: Routine I&O's with follow-up chemistries 8. Diabetes mellitus peripheral neuropathy. Hemoglobin A1c 6.1.Tradjenta 5 mg daily, Glucophage 1000 mg twice a day. Check blood sugars before meals and at bedtime. Diabetic teaching CBG (last 3)   Recent Labs  12/06/15 1658 12/06/15 2043 12/07/15 0645  GLUCAP 124* 154* 155*    9. Hypertension. Norvasc 10 mg daily, Lopressor 25 mg twice a day, lisinopril 20 mg twice a day. Monitor with increased mobility, no change in meds for now Filed Vitals:   12/06/15 2100 12/07/15 0525  BP: 132/76 111/83  Pulse: 64 65  Temp:  98 F (36.7 C)  Resp:  18   10. CAD. Status post PCI. Continue aspirin and Plavix. No chest pain or shortness of breath, monitor with increased walking distance 11. Hypothyroidism. Continue Synthroid. 12. Hiccups. Increased baclofen 22m to QID on 5/22, hiccups better but c/o dizziness, will change to prn 13. Hyperlipidemia. Lipitor 14.  Dizziness- had low HR yesterday, EKG prolonged QT, PACs, HR sounds irregularly irregular this am will recheck EKG, Troponins neg, will reduce lopressor dose and monitor, write parameters,   LOS (Days) 6 A FACE TO FACE EVALUATION WAS PERFORMED  KIRSTEINS,ANDREW E 12/07/2015, 7:12 AM

## 2015-12-07 NOTE — Progress Notes (Signed)
Social Work Derrick Hashimoto, LCSW Social Worker Signed  Patient Care Conference 12/07/2015 12:43 PM    Expand All Collapse All   Inpatient RehabilitationTeam Conference and Plan of Care Update Date: 12/07/2015   Time: 10:45 AM     Patient Name: Derrick [name]       Medical Record Number: [id number]  Date of Birth: 1951-01-05 Sex: Male         Room/Bed: 4M07C/4M07C-01 Payor Info: Payor: MEDICARE / Plan: MEDICARE PART A AND B / Product Type: *No Product type* /    Admitting Diagnosis: CVA  Admit Date/Time:  12/01/2015  5:10 PM Admission Comments: No comment available   Primary Diagnosis:  Stroke, Wallenberg's syndrome Principal Problem: Stroke, Wallenberg's syndrome    Patient Active Problem List     Diagnosis  Date Noted   .  Stroke, Wallenberg's syndrome  12/01/2015   .  Dysphagia, post-stroke  12/01/2015   .  Dysphonia  12/01/2015   .  Ataxia, post-stroke  12/01/2015   .  CVA (cerebral infarction)  11/25/2015   .  History of colon cancer     .  Personal history of colon cancer  10/19/2015   .  Elevated TSH  08/04/2015   .  Cancer of ascending colon ([place])  09/06/2014   .  CAD S/P percutaneous coronary angioplasty  08/17/2014   .  Essential hypertension  08/17/2014   .  Mixed hyperlipidemia  08/17/2014   .  Type 2 diabetes mellitus ([place])  08/17/2014   .  History of stroke  08/17/2014   .  Major depression ([place])  04/20/2014   .  Hx of adenomatous colonic polyps  03/19/2012     Expected Discharge Date: Expected Discharge Date: 12/13/15  Team Members Present: Physician leading conference: Dr. Alysia [name] Social Worker Present: Derrick Kin, LCSW Nurse Present: Derrick Chihuahua, RN PT Present: Derrick [name], PT;Derrick [name], PT OT Present: Derrick [name], OT SLP Present: Derrick [name], SLP PPS Coordinator present : Derrick Nakayama, RN, CRRN        Current Status/Progress  Goal  Weekly Team Focus   Medical     Pt without pain, dizziness  cardiac stabilityprior to d/c  d/c  planning   Bowel/Bladder     continent Bowel/bladder; LBM 5/23  Patient to remain continet of Bowle and bladder whilw in [facility]   monitor bowel/bladded function and timed toiet patient    Swallow/Nutrition/ Hydration     Dys 3, nectar thick; full supervision    mod I   trials of ice chips to continue working towards repeat objective assessment    ADL's     supervision to min A with basic ADLs   mod I goals  dynamic standing balance, balance reactions, functional ambulation , activity tolerance   Mobility     min-mod A; still with L lateropulsion    Mod I overall   Reorientation to midline, balance, decreasing assistance with gait and transfers   Communication     hoarse vocal quality s/p CVA, impacting intelligibility in conversations    mod I   phonation    Safety/Cognition/ Behavioral Observations    higher level executive function deficits   supervision   medication and money management    Pain     Denied any pain or discomfort  <3  Assess and treat any pain or discomfort    Skin     No new skin issue found  Assess patient skin q shift and as needed  Patient skin to remain free from new breakdown/infection      *See Care Plan and progress notes for long and short-term goals.    Barriers to Discharge:  still bradycaric     Possible Resolutions to Barriers:   adjust meds     Discharge Planning/Teaching Needs:   Daughter and granddaughter live with pt and can provide assistance. Daughter does take care of 53 month old daughter. Pt has 10 children to assist him       Team Discussion:    Goals supervision-mod/i level. Working on balance, swallowing and dizziness. L-knee buckles pt is aware it will happen. MD adjusting medicines-decreased lopressor and baclofen. Fees later this week, hopefully can upgrade diet.   Revisions to Treatment Plan:    DC Tuesday 5/30    Continued Need for Acute Rehabilitation Level of Care: The patient requires daily medical management by a physician with  specialized training in physical medicine and rehabilitation for the following conditions: Daily direction of a multidisciplinary physical rehabilitation program to ensure safe treatment while eliciting the highest outcome that is of practical value to the patient.: Yes Daily medical management of patient stability for increased activity during participation in an intensive rehabilitation regime.: Yes Daily analysis of laboratory values and/or radiology reports with any subsequent need for medication adjustment of medical intervention for : Cardiac problems;Neurological problems  Derrick [name] 12/07/2015, 12:43 PM                  Patient ID: Derrick [name], male   DOB: Sep 02, 1950, 65 y.o.   MRN: [id number]

## 2015-12-07 NOTE — Progress Notes (Signed)
Speech Language Pathology Daily Session Note  Patient Details  Name: Derrick [name] MRN: [id number] Date of Birth: [birth year]  Today's Date: 12/07/2015 SLP Individual Time: 1300-1400 SLP Individual Time Calculation (min): 60 min  Short Term Goals: Week 1: SLP Short Term Goal 1 (Week 1): Pt tolerate current diet with use of compensatory strategies without respiratory decline while maintaining nutrition. SLP Short Term Goal 2 (Week 1): Pt demonstrate ability to manage cash with 100% acc mod I. SLP Short Term Goal 3 (Week 1): Pt demonstrate 100% acc with medication management mod I. SLP Short Term Goal 4 (Week 1): Pt demonstrate readiness for repeat objective measure of swallow function. SLP Short Term Goal 5 (Week 1): Pt demonstrate sustained phonation for >4 secs with min A.  Skilled Therapeutic Interventions:  Pt was seen for skilled ST targeting dysphagia goals.  SLP facilitated the session with trials of regular water following thorough oral care to continue working towards liquids advancement.  Pt demonstrated delayed cough in >75% of trials; immediate, strong cough noted x1 following larger boluses.  Pt endorses sensation of residuals post swallow and liquids "going the wrong way" which he reports to improve with multiple swallows and throat clear/coughing.    Nectar thick liquids were trialed to offer comparison to thins and also elicited episodes of delayed coughing, albeit less in frequency.  Pt self reports that he feels that nectar thick liquids are "harder to swallow."  Given that nectar thick liquids offer minimal relief in episodes of coughing and in fact are reported by pt to be more difficult to swallow, recommend repeat objective swallow study at next appointment to objectively determine safest diet consistencies.  Pt left in recliner with call bell within reach.  Continue per current plan of care.    Function:  Eating Eating   Modified Consistency Diet: Yes Eating Assist Level:  More than reasonable amount of time;Swallowing techniques: self managed;Supervision or verbal cues           Cognition Comprehension Comprehension assist level: Follows complex conversation/direction with extra time/assistive device  Expression   Expression assist level: Expresses basic 90% of the time/requires cueing < 10% of the time.  Social Interaction Social Interaction assist level: Interacts appropriately with others with medication or extra time (anti-anxiety, antidepressant).  Problem Solving Problem solving assist level: Solves complex 90% of the time/cues < 10% of the time  Memory Memory assist level: Recognizes or recalls 90% of the time/requires cueing < 10% of the time    Pain Pain Assessment Pain Assessment: No/denies pain  Therapy/Group: Individual Therapy  Shawnay Bramel, Selinda Orion 12/07/2015, 4:26 PM

## 2015-12-07 NOTE — Progress Notes (Signed)
Physical Therapy Note  Patient Details  Name: Derrick [name] MRN: [id number] Date of Birth: 05-12-1951 Today's Date: 12/07/2015  0930-1030, 60 min individual tx  Pain: none noted  Pt fatigued from earlier session with OT in which he ambulated to/from gym.  Pt rested briefly in room, then gait with  RW x 157' to/from gym; L knee buckled x 2, and pt listed L several times but stopped to self correct.  neuromuscular re-education via demo, visual feedback, forced use for NuStep at level 4 x 7 minutes focusing on neutral hip rotaiton; biased to L standing during bil hand task; reciprocal scooting with limited foot support to increase pelvic dissociation.  Gait to return to room. Pt left resting in recliner with all needs within reach.   [name] 12/07/2015, 7:53 AM

## 2015-12-07 NOTE — Progress Notes (Signed)
Occupational Therapy Session Note  Patient Details  Name: Derrick [name] MRN: [id number] Date of Birth: Aug 06, 1950  Today's Date: 12/07/2015 OT Individual Time: [id number] OT Individual Time Calculation (min): 44 min    Short Term Goals: Week 1:  OT Short Term Goal 1 (Week 1): Pt will complete all bathing and dressing sit to stand with supervision. OT Short Term Goal 2 (Week 1): Pt will complete toilet transfers with supervision using RW to elevated toilet.  OT Short Term Goal 3 (Week 1): Pt will maintain dynamic standing balance with no more than supervision during simple meal prep.   OT Short Term Goal 4 (Week 1): Pt will complete tub/shower transfers with supervision using RW and shower bench.    Skilled Therapeutic Interventions/Progress Updates:    Pt worked on Brewing technologist for dynamic standing balance during session.  Min guard assist for mobility with use of the RW during session.  Min assist for standing balance while performing squat to stand for picking up objects from the floor during PVC puzzle.  Min assist for mobility without use of an assistive device.  Still with decreased weightshift to the right and shorter step length on the left side, but with less LOB to the left compared to previous week.  Pt still fatiguing quickly, requiring rest breaks during mobility.  Returned to room at end of session and pt was left in bedside chair with call button in reach, waiting on next therapist.    Therapy Documentation Precautions:  Precautions Precautions: Fall Precaution Comments: L-lean/LOB Restrictions Weight Bearing Restrictions: No  Pain: Pain Assessment Pain Assessment: No/denies pain Pain Score: 0-No pain ADL: See Function Navigator for Current Functional Status.   Therapy/Group: Individual Therapy  Dariyon Urquilla OTR/L 12/07/2015, 12:25 PM

## 2015-12-07 NOTE — Progress Notes (Signed)
Social Work Patient ID: Derrick [name], male   DOB: Jan 22, 1951, 65 y.o.   MRN: [id number] Met with pt and tow daughter's to discuss team conference goals-mod/i-supervision level and discharge date 5/30. Aware swallow test sometime this week. He is aware of his balance issues and knee buckling. Daughter's very supportive and report he will have 24 hr care at home. Discussed family education and they can be here at 11:00 on Monday to Attend therapies with pt. Will work toward discharge and needs.

## 2015-12-08 ENCOUNTER — Inpatient Hospital Stay (HOSPITAL_COMMUNITY): Payer: Medicare Other | Admitting: Speech Pathology

## 2015-12-08 ENCOUNTER — Inpatient Hospital Stay (HOSPITAL_COMMUNITY): Payer: Medicare Other

## 2015-12-08 ENCOUNTER — Inpatient Hospital Stay (HOSPITAL_COMMUNITY): Payer: Medicare Other | Admitting: Occupational Therapy

## 2015-12-08 LAB — GLUCOSE, CAPILLARY
GLUCOSE-CAPILLARY: 159 mg/dL — AB (ref 65–99)
GLUCOSE-CAPILLARY: 154 mg/dL — AB (ref 65–99)
Glucose-Capillary: 128 mg/dL — ABNORMAL HIGH (ref 65–99)
Glucose-Capillary: 160 mg/dL — ABNORMAL HIGH (ref 65–99)

## 2015-12-08 LAB — CREATININE, SERUM
CREATININE: 1.2 mg/dL (ref 0.61–1.24)
GFR calc Af Amer: >60 mL/min (ref >60)

## 2015-12-08 MED ORDER — METOPROLOL TARTRATE 25 MG/10 ML ORAL SUSPENSION
6.2500 mg | Freq: Two times a day (BID) | ORAL | Status: DC
  Administered 2015-12-08 (×2): 6.25 mg via ORAL
  Filled 2015-12-08 (×3): qty 5

## 2015-12-08 NOTE — Progress Notes (Addendum)
Physical Therapy Note  Patient Details  Name: Derrick [name] MRN: [id number] Date of Birth: [birth year] Today's Date: 12/08/2015  1300-1400, 60 min individual  Pain: none noted.    neuromuscular re-education via forced use, manual cues, demo for bil seated hip ERs stretch, and bil hamstring and heel cord stretches, x 30 seconds x 2 each. Kinetron in sitting> standing facilitating head and trunk righting to control drift to L.  Pt demonstrates poor R trunk shortening with full L wt bearing.   Community gait outdoors on slightly sloping concrete with RW, min/mod assist due to excessive L lean.  Up/down 8 (4 x 2) outdoor steps 1 rail, bil hands on 1 rail, non alternaing R and L, turned sideways, with min guard assist. Gait on level tile to return to room with RW; pt left resting in recliner with all needs within reach.  Some LTGs downgraded for gait and transfers today due to pt's slow progress with midline orientation in standing, especially dynamic standing as he moves L    [name] 12/08/2015, 7:55 AM

## 2015-12-08 NOTE — Progress Notes (Signed)
Speech Language Pathology Daily Session Note  Patient Details  Name: Derrick [name] MRN: [id number] Date of Birth: 1951/07/13  Today's Date: 12/08/2015 SLP Individual Time: 1100-1200 SLP Individual Time Calculation (min): 60 min  Short Term Goals: Week 1: SLP Short Term Goal 1 (Week 1): Pt tolerate current diet with use of compensatory strategies without respiratory decline while maintaining nutrition. SLP Short Term Goal 2 (Week 1): Pt demonstrate ability to manage cash with 100% acc mod I. SLP Short Term Goal 3 (Week 1): Pt demonstrate 100% acc with medication management mod I. SLP Short Term Goal 4 (Week 1): Pt demonstrate readiness for repeat objective measure of swallow function. SLP Short Term Goal 5 (Week 1): Pt demonstrate sustained phonation for >4 secs with min A.  Skilled Therapeutic Interventions:  Pt was seen for skilled ST targeting goals for dysphagia and communication.  SLP facilitated the session with trials of ice chips to continue working towards diet progression per results of FEES.  Pt demonstrated delayed throat clear x2-3 when consuming ~15 trials of ice chips.  Educated pt regarding results of study and recommendation for repeat MBS to determine volume of aspiration.  Pt verbalized understanding.  Given relatively good toleration of ice chips, recommend initiation of the ice chips protocol to facilitate hydration and pt pleasure.  Pt was able to sustain phonation of vocalic /ah/ for an average of 3 seconds.  Longer periods of phonation noted with production of /ee/; 7 seconds.  Pt also able to return demonstration of laryngeal pitch glides with supervision instructional cues.  Pt left sitting at edge of bed with family at bedside.  Continue per current plan of care.    Function:  Eating Eating   Modified Consistency Diet: Yes Eating Assist Level: Supervision or verbal cues           Cognition Comprehension Comprehension assist level: Follows complex  conversation/direction with extra time/assistive device  Expression   Expression assist level: Expresses basic 90% of the time/requires cueing < 10% of the time.  Social Interaction Social Interaction assist level: Interacts appropriately with others with medication or extra time (anti-anxiety, antidepressant).  Problem Solving Problem solving assist level: Solves complex 90% of the time/cues < 10% of the time  Memory Memory assist level: Recognizes or recalls 90% of the time/requires cueing < 10% of the time    Pain Pain Assessment Pain Assessment: No/denies pain  Therapy/Group: Individual Therapy  Roxie Kreeger, Selinda Orion 12/08/2015, 3:51 PM
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
## 2015-12-08 NOTE — Progress Notes (Addendum)
Subjective/Complaints: Took baclofen for hiccups yesterday, no problem overnite No CP or SOB   ROS- no , N/V/D, no limb or spine pain Objective: Vital Signs: Blood pressure 115/81, pulse 59, temperature 97.3 F (36.3 C), temperature source Oral, resp. rate 17, height _0  (1.727 m), weight 104.463 kg (230 lb 4.8 oz), SpO2 99 %. No results found. Results for orders placed or performed during the hospital encounter of 12/01/15 (from the past 72 hour(s))  Glucose, capillary     Status: Abnormal   Collection Time: 12/05/15 11:29 AM  Result Value Ref Range   Glucose-Capillary 237 (H) 65 - 99 mg/dL  Glucose, capillary     Status: Abnormal   Collection Time: 12/05/15  4:33 PM  Result Value Ref Range   Glucose-Capillary 150 (H) 65 - 99 mg/dL  Glucose, capillary     Status: Abnormal   Collection Time: 12/05/15  8:52 PM  Result Value Ref Range   Glucose-Capillary 198 (H) 65 - 99 mg/dL  Glucose, capillary     Status: Abnormal   Collection Time: 12/06/15  7:04 AM  Result Value Ref Range   Glucose-Capillary 155 (H) 65 - 99 mg/dL  Glucose, capillary     Status: Abnormal   Collection Time: 12/06/15 11:50 AM  Result Value Ref Range   Glucose-Capillary 208 (H) 65 - 99 mg/dL  Troponin I (q 6hr x 3)     Status: None   Collection Time: 12/06/15  4:28 PM  Result Value Ref Range   Troponin I <0.03 <0.031 ng/mL    Comment:        NO INDICATION OF MYOCARDIAL INJURY.   Glucose, capillary     Status: Abnormal   Collection Time: 12/06/15  4:58 PM  Result Value Ref Range   Glucose-Capillary 124 (H) 65 - 99 mg/dL  Glucose, capillary     Status: Abnormal   Collection Time: 12/06/15  8:43 PM  Result Value Ref Range   Glucose-Capillary 154 (H) 65 - 99 mg/dL  Troponin I (q 6hr x 3)     Status: None   Collection Time: 12/06/15  9:20 PM  Result Value Ref Range   Troponin I <0.03 <0.031 ng/mL    Comment:        NO INDICATION OF MYOCARDIAL INJURY.   Troponin I (q 6hr x 3)     Status: None    Collection Time: 12/07/15  4:00 AM  Result Value Ref Range   Troponin I <0.03 <0.031 ng/mL    Comment:        NO INDICATION OF MYOCARDIAL INJURY.   Glucose, capillary     Status: Abnormal   Collection Time: 12/07/15  6:45 AM  Result Value Ref Range   Glucose-Capillary 155 (H) 65 - 99 mg/dL  Glucose, capillary     Status: Abnormal   Collection Time: 12/07/15 11:45 AM  Result Value Ref Range   Glucose-Capillary 173 (H) 65 - 99 mg/dL  Glucose, capillary     Status: Abnormal   Collection Time: 12/07/15  4:49 PM  Result Value Ref Range   Glucose-Capillary 153 (H) 65 - 99 mg/dL  Glucose, capillary     Status: Abnormal   Collection Time: 12/07/15  8:49 PM  Result Value Ref Range   Glucose-Capillary 120 (H) 65 - 99 mg/dL  Creatinine, serum     Status: None   Collection Time: 12/08/15  4:49 AM  Result Value Ref Range   Creatinine, Ser 1.20 0.61 - 1.24 mg/dL  GFR calc non Af Amer >60 >60 mL/min   GFR calc Af Amer >60 >60 mL/min    Comment: (NOTE) The eGFR has been calculated using the CKD EPI equation. This calculation has not been validated in all clinical situations. eGFR's persistently <60 mL/min signify possible Chronic Kidney Disease.   Glucose, capillary     Status: Abnormal   Collection Time: 12/08/15  6:40 AM  Result Value Ref Range   Glucose-Capillary 159 (H) 65 - 99 mg/dL     HEENT: normal Cardio: Ireg Irreg and no murmur Resp: CTA B/L and unlabored GI: BS positive and NT, ND Extremity:  Pulses positive and No Edema Skin:   Intact Neuro: Alert/Oriented, Normal Sensory, Normal Motor, Abnormal FMC Ataxic/ dec FMC and Other protracted hiccups Musc/Skel:  Normal Gen NAD   Assessment/Plan: 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 Physiatrist is providing close team supervision and 24 hour management of active medical problems listed  below. Physiatrist and rehab team continue to assess barriers to discharge/monitor patient progress toward functional and medical goals. FIM: Function - Bathing Position: Wheelchair/chair at sink Body parts bathed by patient: Right arm, Left arm, Chest, Abdomen, Front perineal area, Buttocks, Right upper leg, Left upper leg, Right lower leg, Back, Left lower leg Assist Level: Supervision or verbal cues  Function- Upper Body Dressing/Undressing What is the patient wearing?: Pull over shirt/dress Pull over shirt/dress - Perfomed by patient: Thread/unthread right sleeve, Thread/unthread left sleeve, Put head through opening, Pull shirt over trunk Assist Level: Set up (per OT note) Set up : To obtain clothing/put away Function - Lower Body Dressing/Undressing What is the patient wearing?: Pants, Underwear, Ted Hose, Non-skid slipper socks Position: Wheelchair/chair at [employer] - Performed by patient: Thread/unthread right underwear leg, Thread/unthread left underwear leg, Pull underwear up/down Pants- Performed by patient: Thread/unthread right pants leg, Thread/unthread left pants leg, Pull pants up/down, Fasten/unfasten pants Non-skid slipper socks- Performed by patient: Don/doff right sock, Don/doff left sock Socks - Performed by patient: Don/doff left sock, Don/doff right sock TED Hose - Performed by patient: Don/doff right TED hose, Don/doff left TED hose Assist for footwear: Supervision/touching assist Assist for lower body dressing: Supervision or verbal cues (per OT note)  Function - Toileting Toileting activity did not occur: Refused Toileting steps completed by patient: Performs perineal hygiene Toileting steps completed by helper: Adjust clothing prior to toileting, Adjust clothing after toileting Toileting Assistive Devices: Grab bar or rail Assist level: Touching or steadying assistance (Pt.75%)  Function - Air cabin crew transfer activity did not occur:  Refused Toilet transfer assistive device: Bedside commode, Grab bar, Walker Assist level to toilet: Moderate assist (Pt 50 - 74%/lift or lower) Assist level from toilet: Moderate assist (Pt 50 - 74%/lift or lower)  Function - Chair/bed transfer Chair/bed transfer method: Ambulatory, Stand pivot Chair/bed transfer assist level: Touching or steadying assistance (Pt > 75%) Chair/bed transfer assistive device: Armrests, Walker Chair/bed transfer details: Verbal cues for sequencing, Verbal cues for technique, Visual cues/gestures for sequencing, Manual facilitation for weight shifting, Verbal cues for safe use of DME/AE  Function - Locomotion: Wheelchair Will patient use wheelchair at discharge?: No Type: Manual Max wheelchair distance: 150 Assist Level: Supervision or verbal cues Assist Level: Supervision or verbal cues Assist Level: Supervision or verbal cues Turns around,maneuvers to table,bed, and toilet,negotiates 3% grade,maneuvers on rugs and over doorsills: No Function - Locomotion: Ambulation Assistive device: Walker-rolling Max distance:  150 Assist level: Moderate assist (Pt 50 - 74%) Assist level: Touching or steadying assistance (Pt > 75%) Walk 50 feet with 2 turns activity did not occur: Safety/medical concerns Assist level: Moderate assist (Pt 50 - 74%) Walk 150 feet activity did not occur: Safety/medical concerns Assist level: Moderate assist (Pt 50 - 74%) Walk 10 feet on uneven surfaces activity did not occur: Safety/medical concerns  Function - Comprehension Comprehension: Auditory Comprehension assist level: Understands basic 90% of the time/cues < 10% of the time  Function - Expression Expression: Verbal Expression assist level: Expresses basic 90% of the time/requires cueing < 10% of the time.  Function - Social Interaction Social Interaction assist level: Interacts appropriately 90% of the time - Needs monitoring or encouragement for participation or  interaction.  Function - Problem Solving Problem solving assist level: Solves basic 90% of the time/requires cueing < 10% of the time  Function - Memory Memory assist level: Recognizes or recalls 90% of the time/requires cueing < 10% of the time Patient normally able to recall (first 3 days only): Current season    Medical Problem List and Plan: 1.  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  DVT Prophylaxis/Anticoagulation: Subcutaneous Lovenox. Monitor platelet counts and any signs of bleeding 3. Pain Management: Tylenol as needed 4. Dysphagia. Dysphagia 3 nectar liquids .Follow-up speech therapy.   5. Neuropsych: This patient is capable of making decisions on his own behalf. 6. Skin/Wound Care: Routine skin checks 7. Fluids/Electrolytes/Nutrition: Routine I&O's with follow-up chemistries 8. Diabetes mellitus peripheral neuropathy. Hemoglobin A1c 6.1.Tradjenta 5 mg daily, Glucophage 1000 mg twice a day. Check blood sugars before meals and at bedtime. Diabetic teaching CBG (last 3)   Recent Labs  12/07/15 1649 12/07/15 2049 12/08/15 0640  GLUCAP 153* 120* 159*    9. Hypertension. Norvasc 10 mg daily, Lopressor 12.5 mg twice a day, lisinopril 20 mg twice a day. Monitor with increased mobility, still bradycardic with BP on low side will reduce Lopressor to 6.55m BID  Filed Vitals:   12/07/15 2034 12/08/15 0542  BP: 114/96 115/81  Pulse: 71 59  Temp:  97.3 F (36.3 C)  Resp:  17   10. CAD. Status post PCI. Continue aspirin and Plavix. No chest pain or shortness of breath, monitor with increased walking distance 11. Hypothyroidism. Continue Synthroid. 12. Hiccups. Increased baclofen 117mto QID on 5/22, hiccups better but c/o dizziness, will change to prn 13. Hyperlipidemia. Lipitor 14.  Dizziness- bradycardia plus baclofen, will also check orthostatics  LOS (Days) 7 A FACE TO FACE EVALUATION WAS  PERFORMED  Derrick [name] E 12/08/2015, 7:05 AM

## 2015-12-08 NOTE — Progress Notes (Signed)
Occupational Therapy Weekly Progress Note  Patient Details  Name: Derrick [name] MRN: [id number] Date of Birth: 12/29/1950  Beginning of progress report period: Dec 02, 2015 End of progress report period: Dec 08, 2015  Today's Date: 12/08/2015 OT Individual Time: 0800-0858 OT Individual Time Calculation (min): 58 min    Patient has met 1 of 4 short term goals.  Derrick [name] has demonstrated progress with balance and selfcare independence over the past week.  He continues however to exhibit increased weightshift to the left in standing with frequent LOB to the left during dynamic standing tasks.  Mod assist needed for mobility and transfers in the room without use of a RW for support.  Min guard for transfers with use of the RW.  He does demonstrate limited endurance, requiring frequent rest breaks during dynamic tasks.  Feel he will continue to make steady progress over the next week with expected discharge 5/30.  Will continue to work toward modified independent to supervision level  goals for home.   Patient continues to demonstrate the following deficits: decreased balance, decreased endurance, decreased strength, and therefore will continue to benefit from skilled OT intervention to enhance overall performance with BADL and Reduce care partner burden.  Patient progressing toward long term goals..  Continue plan of care.  OT Short Term Goals Week 2:  OT Short Term Goal 1 (Week 2): Continue working on supervision to mod independent level goals for discharge.   Skilled Therapeutic Interventions/Progress Updates:    Pt completed bathing and dressing during session, without use of an assistive device for gathering clothing and ambulation to the sink and shower.  Min assist needed as pt leans left at times with LOB or slight stumble secondary to not clearing the left foot because of the decreased weightshift.  He was able to complete bathing and dressing sit to stand with supervision however,  except for ambulating to gather the clothing.  Had pt work on dynamic standing balance in the therapy gym while engaged in tossing horseshoes.  Had pt complete lateral wieghtshifts to the right for reaching for horseshoes as well as slightly to the left, with emphasis on not losing his balance.  Still with LOB when reaching to the left as well as when stepping forward with the LLE to toss the horseshoe.  Worked on standing while attempting to keep the LLE up on a 2" step with pt needing mod assist for static standing balance.  Returned to room at end of session with pt left laying in bed with call button in reach.    Therapy Documentation Precautions:  Precautions Precautions: Fall Precaution Comments: L-lean/LOB Restrictions Weight Bearing Restrictions: No  Pain: Pain Assessment Pain Assessment: No/denies pain ADL: See Function Navigator for Current Functional Status.   Therapy/Group: Individual Therapy  , OTR/L 12/08/2015, 12:10 PM   

## 2015-12-09 ENCOUNTER — Inpatient Hospital Stay (HOSPITAL_COMMUNITY): Payer: Medicare Other | Admitting: Physical Therapy

## 2015-12-09 ENCOUNTER — Inpatient Hospital Stay (HOSPITAL_COMMUNITY): Payer: Medicare Other

## 2015-12-09 ENCOUNTER — Inpatient Hospital Stay (HOSPITAL_COMMUNITY): Payer: Medicare Other | Admitting: Occupational Therapy

## 2015-12-09 ENCOUNTER — Encounter (HOSPITAL_COMMUNITY): Payer: Self-pay | Admitting: Speech Pathology

## 2015-12-09 ENCOUNTER — Inpatient Hospital Stay (HOSPITAL_COMMUNITY): Payer: Self-pay | Admitting: Speech Pathology

## 2015-12-09 LAB — GLUCOSE, CAPILLARY
GLUCOSE-CAPILLARY: 157 mg/dL — AB (ref 65–99)
GLUCOSE-CAPILLARY: 83 mg/dL (ref 65–99)
GLUCOSE-CAPILLARY: 164 mg/dL — AB (ref 65–99)
Glucose-Capillary: 215 mg/dL — ABNORMAL HIGH (ref 65–99)

## 2015-12-09 MED ORDER — GLUCERNA 1.2 CAL PO LIQD
237.0000 mL | Freq: Two times a day (BID) | ORAL | Status: DC
  Administered 2015-12-09 – 2015-12-10 (×2): 237 mL via ORAL
  Filled 2015-12-09 (×11): qty 237

## 2015-12-09 NOTE — Progress Notes (Signed)
Speech Language Pathology Weekly Progress and Session Note  Patient Details  Name: Derrick [name] MRN: [id number] Date of Birth: [birth year]  Beginning of progress report period: Dec 04, 2015   End of progress report period: Dec 09, 2015   Today's Date: 12/09/2015 SLP Individual Time: 1300-1400 SLP Individual Time Calculation (min): 60 min  Short Term Goals: Week 1: SLP Short Term Goal 1 (Week 1): Pt tolerate current diet with use of compensatory strategies without respiratory decline while maintaining nutrition. SLP Short Term Goal 1 - Progress (Week 1): Met SLP Short Term Goal 2 (Week 1): Pt demonstrate ability to manage cash with 100% acc mod I. SLP Short Term Goal 2 - Progress (Week 1): Other (comment) (not addressed this reporting period to allow focus on diet progression ) SLP Short Term Goal 3 (Week 1): Pt demonstrate 100% acc with medication management mod I. SLP Short Term Goal 3 - Progress (Week 1): Met SLP Short Term Goal 4 (Week 1): Pt demonstrate readiness for repeat objective measure of swallow function. SLP Short Term Goal 4 - Progress (Week 1): Met SLP Short Term Goal 5 (Week 1): Pt demonstrate sustained phonation for >4 secs with min A. SLP Short Term Goal 5 - Progress (Week 1): Met    New Short Term Goals: Week 2: SLP Short Term Goal 1 (Week 2): Pt will consume dys 3 textures and thin liquids via teaspoon with mod I use of swallowing precautions and minimal overt s/s of aspiration.   SLP Short Term Goal 2 (Week 2): Pt will demonstrate sustained phonation with mod I for >5 seconds.   Weekly Progress Updates: Pt has made good gains this reporting period and has met 4 out of 4 addressed short term goals.  Pt is currently consuming dys 3 textures and thin liquids via teaspoon with supervision verbal cues for use of swallowing precautions.  Pt is currently supervision-mod I for semi-complex cognitive tasks.  Pt would continue to benefit from skilled ST while inpatient in order  to maximize functional independence and reduce burden of care prior to discharge.  Pt and family education is ongoing.  Recommend ST follow up at next level of care, either at home health or outpatient setting.     Intensity: Minumum of 1-2 x/day, 30 to 90 minutes Frequency: 3 to 5 out of 7 days Duration/Length of Stay: 7-10 days Treatment/Interventions: Cognitive remediation/compensation;Dysphagia/aspiration precaution training;Multimodal communication approach;Cueing hierarchy;Functional tasks;Patient/family education;Medication managment   Daily Session  Skilled Therapeutic Interventions: Pt was seen for skilled ST targeting dysphagia goals.  Pt recalled recommendation for use teaspoon boluses of thin liquids independently but required min assist faded to supervision verbal cues for use of multiple swallows.  Pt with evidence of solid food dysphagia with starches and meats during lunch and orally expectorated small pieces of ham and macaroni noodles after swallowing which was not elicited during today's MBS although has been reported by pt.  Intermittent throat clearing noted following teaspoons of thin liquids.  Pt only consumed ~25% of meal which he reports to be typical since CVA; pt does not report significant weight loss.  Educated pt on option for full liquids diet now that he is on thin liquids to decrease symptoms of solids dysphagia and facilitate increased PO intake; however, pt politely declined due to preference to remain on dys 3 textures.  Pt aware of his aspiration risk given results of FEES and MBS following extensive education with SLP.  Understandably so, pt continues to express wishes to  remain on current diet and is agreeable to comply with swallowing precautions.  SLP stressed the importance of thorough oral hygiene in minimizing risk of developing pneumonia as he remains likely to intermittently aspirate.  P.A. Made aware of poor PO intake and therapist's recommendations for  further esophageal assessment with GI.  Pt left in wheelchair with call bell within reach.  Goals updated on this date to reflect current progress and plan of care.     Function:   Eating Eating   Modified Consistency Diet: Yes Eating Assist Level: Supervision or verbal cues           Cognition Comprehension Comprehension assist level: Follows complex conversation/direction with extra time/assistive device  Expression   Expression assist level: Expresses basic 90% of the time/requires cueing < 10% of the time.  Social Interaction Social Interaction assist level: Interacts appropriately with others with medication or extra time (anti-anxiety, antidepressant).  Problem Solving Problem solving assist level: Solves basic problems with no assist  Memory Memory assist level: Recognizes or recalls 75 - 89% of the time/requires cueing 10 - 24% of the time   General    Pain Pain Assessment Pain Assessment: No/denies pain  Therapy/Group: Individual Therapy  Daren Yeagle, Selinda Orion 12/09/2015, 4:40 PM

## 2015-12-09 NOTE — Progress Notes (Signed)
Subjective/Complaints: Still a little dizzy yesterday No CP or SOB   ROS- no , N/V/D, no limb or spine pain Objective: Vital Signs: Blood pressure 105/67, pulse 77, temperature 97.7 F (36.5 C), temperature source Oral, resp. rate 18, height '5\' 8"'  (1.727 m), weight 103.284 kg (227 lb 11.2 oz), SpO2 99 %. No results found. Results for orders placed or performed during the hospital encounter of 12/01/15 (from the past 72 hour(s))  Glucose, capillary     Status: Abnormal   Collection Time: 12/06/15  7:04 AM  Result Value Ref Range   Glucose-Capillary 155 (H) 65 - 99 mg/dL  Glucose, capillary     Status: Abnormal   Collection Time: 12/06/15 11:50 AM  Result Value Ref Range   Glucose-Capillary 208 (H) 65 - 99 mg/dL  Troponin I (q 6hr x 3)     Status: None   Collection Time: 12/06/15  4:28 PM  Result Value Ref Range   Troponin I <0.03 <0.031 ng/mL    Comment:        NO INDICATION OF MYOCARDIAL INJURY.   Glucose, capillary     Status: Abnormal   Collection Time: 12/06/15  4:58 PM  Result Value Ref Range   Glucose-Capillary 124 (H) 65 - 99 mg/dL  Glucose, capillary     Status: Abnormal   Collection Time: 12/06/15  8:43 PM  Result Value Ref Range   Glucose-Capillary 154 (H) 65 - 99 mg/dL  Troponin I (q 6hr x 3)     Status: None   Collection Time: 12/06/15  9:20 PM  Result Value Ref Range   Troponin I <0.03 <0.031 ng/mL    Comment:        NO INDICATION OF MYOCARDIAL INJURY.   Troponin I (q 6hr x 3)     Status: None   Collection Time: 12/07/15  4:00 AM  Result Value Ref Range   Troponin I <0.03 <0.031 ng/mL    Comment:        NO INDICATION OF MYOCARDIAL INJURY.   Glucose, capillary     Status: Abnormal   Collection Time: 12/07/15  6:45 AM  Result Value Ref Range   Glucose-Capillary 155 (H) 65 - 99 mg/dL  Glucose, capillary     Status: Abnormal   Collection Time: 12/07/15 11:45 AM  Result Value Ref Range   Glucose-Capillary 173 (H) 65 - 99 mg/dL  Glucose, capillary      Status: Abnormal   Collection Time: 12/07/15  4:49 PM  Result Value Ref Range   Glucose-Capillary 153 (H) 65 - 99 mg/dL  Glucose, capillary     Status: Abnormal   Collection Time: 12/07/15  8:49 PM  Result Value Ref Range   Glucose-Capillary 120 (H) 65 - 99 mg/dL  Creatinine, serum     Status: None   Collection Time: 12/08/15  4:49 AM  Result Value Ref Range   Creatinine, Ser 1.20 0.61 - 1.24 mg/dL   GFR calc non Af Amer >60 >60 mL/min   GFR calc Af Amer >60 >60 mL/min    Comment: (NOTE) The eGFR has been calculated using the CKD EPI equation. This calculation has not been validated in all clinical situations. eGFR's persistently <60 mL/min signify possible Chronic Kidney Disease.   Glucose, capillary     Status: Abnormal   Collection Time: 12/08/15  6:40 AM  Result Value Ref Range   Glucose-Capillary 159 (H) 65 - 99 mg/dL  Glucose, capillary     Status: Abnormal  Collection Time: 12/08/15 11:46 AM  Result Value Ref Range   Glucose-Capillary 154 (H) 65 - 99 mg/dL  Glucose, capillary     Status: Abnormal   Collection Time: 12/08/15  4:46 PM  Result Value Ref Range   Glucose-Capillary 128 (H) 65 - 99 mg/dL  Glucose, capillary     Status: Abnormal   Collection Time: 12/08/15  9:10 PM  Result Value Ref Range   Glucose-Capillary 160 (H) 65 - 99 mg/dL     HEENT: normal Cardio: Ireg Irreg and no murmur Resp: CTA B/L and unlabored GI: BS positive and NT, ND Extremity:  Pulses positive and No Edema Skin:   Intact Neuro: Alert/Oriented, Normal Sensory, Normal Motor, Abnormal FMC Ataxic/ dec FMC and Other protracted hiccups Musc/Skel:  Normal Gen NAD   Assessment/Plan: 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 Physiatrist is providing close team supervision and 24 hour management of active medical problems listed below. Physiatrist and rehab team continue  to assess barriers to discharge/monitor patient progress toward functional and medical goals. FIM: Function - Bathing Position: Shower Body parts bathed by patient: Right arm, Left arm, Chest, Abdomen, Front perineal area, Buttocks, Right upper leg, Left upper leg, Right lower leg, Left lower leg Bathing not applicable: Back Assist Level: Supervision or verbal cues  Function- Upper Body Dressing/Undressing What is the patient wearing?: Pull over shirt/dress Pull over shirt/dress - Perfomed by patient: Thread/unthread right sleeve, Thread/unthread left sleeve, Put head through opening, Pull shirt over trunk Assist Level: Supervision or verbal cues Set up : To obtain clothing/put away Function - Lower Body Dressing/Undressing What is the patient wearing?: Pants, Underwear, Ted Hose, Non-skid slipper socks, Shoes Position: Education officer, museum at [employer] - Performed by patient: Thread/unthread right underwear leg, Thread/unthread left underwear leg, Pull underwear up/down Pants- Performed by patient: Thread/unthread right pants leg, Thread/unthread left pants leg, Pull pants up/down, Fasten/unfasten pants Non-skid slipper socks- Performed by patient: Don/doff right sock, Don/doff left sock Socks - Performed by patient: Don/doff left sock, Don/doff right sock Shoes - Performed by patient: Don/doff right shoe, Don/doff left shoe, Fasten right, Fasten left TED Hose - Performed by patient: Don/doff right TED hose, Don/doff left TED hose Assist for footwear: Supervision/touching assist Assist for lower body dressing: Supervision or verbal cues  Function - Toileting Toileting activity did not occur: Refused Toileting steps completed by patient: Performs perineal hygiene Toileting steps completed by helper: Adjust clothing prior to toileting, Adjust clothing after toileting Toileting Assistive Devices: Grab bar or rail Assist level: Touching or steadying assistance (Pt.75%)  Function -  Air cabin crew transfer activity did not occur: Refused Toilet transfer assistive device: Grab bar Assist level to toilet: Supervision or verbal cues Assist level from toilet: Supervision or verbal cues  Function - Chair/bed transfer Chair/bed transfer method: Stand pivot Chair/bed transfer assist level: Moderate assist (Pt 50 - 74%/lift or lower) Chair/bed transfer assistive device: Armrests, Walker Chair/bed transfer details: Manual facilitation for weight shifting, Verbal cues for sequencing  Function - Locomotion: Wheelchair Will patient use wheelchair at discharge?: No Type: Manual Max wheelchair distance: 150 Assist Level: Supervision or verbal cues Assist Level: Supervision or verbal cues Assist Level: Supervision or verbal cues Turns around,maneuvers to table,bed, and toilet,negotiates 3% grade,maneuvers on rugs and over doorsills: No Function - Locomotion: Ambulation Assistive device: Walker-rolling Max distance: 150 Assist level: Touching or steadying assistance (Pt > 75%) Assist level: Touching or steadying  assistance (Pt > 75%) Walk 50 feet with 2 turns activity did not occur: Safety/medical concerns Assist level: Moderate assist (Pt 50 - 74%) Walk 150 feet activity did not occur: Safety/medical concerns Assist level: Moderate assist (Pt 50 - 74%) Walk 10 feet on uneven surfaces activity did not occur: Safety/medical concerns Assist level: Moderate assist (Pt 50 - 74%)  Function - Comprehension Comprehension: Auditory Comprehension assist level: Understands basic 90% of the time/cues < 10% of the time  Function - Expression Expression: Verbal Expression assist level: Expresses basic 90% of the time/requires cueing < 10% of the time.  Function - Social Interaction Social Interaction assist level: Interacts appropriately 90% of the time - Needs monitoring or encouragement for participation or interaction.  Function - Problem Solving Problem solving  assist level: Solves basic 90% of the time/requires cueing < 10% of the time  Function - Memory Memory assist level: Recognizes or recalls 25 - 49% of the time/requires cueing 50 - 75% of the time Patient normally able to recall (first 3 days only): Current season    Medical Problem List and Plan: 1.  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  DVT Prophylaxis/Anticoagulation: Subcutaneous Lovenox. Monitor platelet counts and any signs of bleeding 3. Pain Management: Tylenol as needed 4. Dysphagia. Dysphagia 3 nectar liquids .Follow-up speech therapy.   5. Neuropsych: This patient is capable of making decisions on his own behalf. 6. Skin/Wound Care: Routine skin checks 7. Fluids/Electrolytes/Nutrition: Routine I&O's with follow-up chemistries 8. Diabetes mellitus peripheral neuropathy. Hemoglobin A1c 6.1.Tradjenta 5 mg daily, Glucophage 1000 mg twice a day. Check blood sugars before meals and at bedtime. Diabetic teaching, controlled CBG (last 3)   Recent Labs  12/08/15 1146 12/08/15 1646 12/08/15 2110  GLUCAP 154* 128* 160*    9. Hypertension. Norvasc 10 mg daily, Lopressor 12.5 mg twice a day, lisinopril 20 mg twice a day. Monitor with increased mobility, still bradycardic with BP on low side will D/C Lopressor  Filed Vitals:   12/08/15 2005 12/09/15 0514  BP: 130/80 105/67  Pulse: 81 77  Temp:  97.7 F (36.5 C)  Resp:  18   10. CAD. Status post PCI. Continue aspirin and Plavix. No chest pain or shortness of breath, monitor with increased walking distance 11. Hypothyroidism. Continue Synthroid. 12. Hiccups. Increased baclofen 2m to QID on 5/22, hiccups better but c/o dizziness, will change to prn 13. Hyperlipidemia. Lipitor 14.  Dizziness- bradycardia plus baclofen, will also check orthostatics  LOS (Days) 8 A FACE TO FACE EVALUATION WAS PERFORMED  Burlon Centrella E 12/09/2015, 6:52 AM

## 2015-12-09 NOTE — Progress Notes (Signed)
Occupational Therapy Session Note  Patient Details  Name: Derrick [name] MRN: [id number] Date of Birth: [birth year]  Today's Date: 12/09/2015 OT Individual Time: 0902-1000 OT Individual Time Calculation (min): 58 min    Short Term Goals: Week 2:  OT Short Term Goal 1 (Week 2): Continue working on supervision to mod independent level goals for discharge.   Skilled Therapeutic Interventions/Progress Updates:    Pt ambulated to the therapy gym with min guard assist and no significant LOB to the left.  Worked on standing balance and weightshifts to the right with incorporating of Wii balance board.  Pt needing mod assist for increased weightshift through his hips to the right side as he would attempt to just flex his head and trunk instead of shifting his weight.  Added 1" lift to the left shoe to help increase weightshift as well with pt still needing min assist to complete weight shifts to the left during activity.  Removed lift for ambulation without assistive device.  Pt with improved balance, only needing min guard assist for walking in the hallway, however once quick turns were initiated LOB to the left occurred.  Min assist overall to regain balance with turns to both the left and right with occasional short step length on the left and slight stumble noted.  Pt returned to room at end of session, positioned in bedside chair per request.  Call button, phone, and urinal all in reach.    Therapy Documentation Precautions:  Precautions Precautions: Fall Precaution Comments: L-lean/LOB Restrictions Weight Bearing Restrictions: No  Pain: Pain Assessment Pain Assessment: No/denies pain ADL: See Function Navigator for Current Functional Status.   Therapy/Group: Individual Therapy  Elby Blackwelder OTR/L 12/09/2015, 4:37 PM

## 2015-12-09 NOTE — Progress Notes (Signed)
Physical Therapy Session Note  Patient Details  Name: Derrick [name] MRN: [id number] Date of Birth: 1951-06-14  Today's Date: 12/09/2015 PT Individual Time: 1515-1610 PT Individual Time Calculation (min): 55 min   Short Term Goals: Week 1:  PT Short Term Goal 1 (Week 1): STG=LTG due to LOS, progress towards Mod I for all mobility Week 2:     Skilled Therapeutic Interventions/Progress Updates:     Patient received sitting in [facility] and agreeable to.  WC mobility for 228ft, 149ft, 120ft with min cues from PT to prevent L lateral drift, alse noted to required increased time and effort with door way management.    Gait training on uneven sidewalk for 193ft, with overall min A and mod A x 3 to prevent lateral LOB. Gait training in controlled environment for 36ft, 80ft and 152ft. Gait training in simulated community environment around gift shop for 177ft with min A from PT. Throughout gait training, PT was required to provide min-mod verbal and visual cues for increased BOS width, increased LLE step length, and improved knee control to prevent genu recurvatum.   Gait training through Obstacle course. Stepping over 2 canes  and weave through 4 cones with RW x 2. Min- mod A from PT with mod cues during turns to prevent LLE crossing over R LE, and AD management with stepping over obstacles.   Patient returned to room and left sitting in [facility] with call bell within reach.   Therapy Documentation Precautions:  Precautions Precautions: Fall Precaution Comments: L-lean/LOB Restrictions Weight Bearing Restrictions: No    Pain: Pain Assessment Pain Assessment: No/denies pain   See Function Navigator for Current Functional Status.   Therapy/Group: Individual Therapy  Lorie Phenix 12/09/2015, 6:45 PM

## 2015-12-09 NOTE — Progress Notes (Signed)
MBSS complete. Full report located under chart review in imaging section.  

## 2015-12-10 ENCOUNTER — Inpatient Hospital Stay (HOSPITAL_COMMUNITY): Payer: Medicare Other | Admitting: Occupational Therapy

## 2015-12-10 ENCOUNTER — Inpatient Hospital Stay (HOSPITAL_COMMUNITY): Payer: Medicare Other | Admitting: Speech Pathology

## 2015-12-10 LAB — GLUCOSE, CAPILLARY
GLUCOSE-CAPILLARY: 163 mg/dL — AB (ref 65–99)
GLUCOSE-CAPILLARY: 206 mg/dL — AB (ref 65–99)
Glucose-Capillary: 109 mg/dL — ABNORMAL HIGH (ref 65–99)
Glucose-Capillary: 157 mg/dL — ABNORMAL HIGH (ref 65–99)

## 2015-12-10 MED ORDER — LISINOPRIL 20 MG PO TABS
20.0000 mg | ORAL_TABLET | Freq: Every day | ORAL | Status: DC
  Administered 2015-12-10 – 2015-12-11 (×2): 20 mg via ORAL
  Filled 2015-12-10 (×3): qty 1
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
## 2015-12-10 NOTE — Progress Notes (Signed)
Speech Language Pathology Daily Session Note  Patient Details  Name: Derrick [name] MRN: [id number] Date of Birth: [birth year]  Today's Date: 12/10/2015 SLP Individual Time: 1330-1400 SLP Individual Time Calculation (min): 30 min  Short Term Goals: Week 2: SLP Short Term Goal 1 (Week 2): Pt will consume dys 3 textures and thin liquids via teaspoon with mod I use of swallowing precautions and minimal overt s/s of aspiration.   SLP Short Term Goal 2 (Week 2): Pt will demonstrate sustained phonation with mod I for >5 seconds.   Skilled Therapeutic Interventions: Skilled treatment session focused on dysphagia and communication goals. Pt stated that he was tolerating current diet well, he was able to recall recent swallow study and recommendations independently (use of spoon for thin liquids and medicines crushed in applesauce). Able to demonstrate use of spoon with consumption of thin liquids independently. Pt consumed dysphagia 3 snack with thin liquids without overt s/s of aspiration. Pt able to sustain phonation for 4 seconds with Mod I. Pt was returned to room and left with all needs within reach. Continue current plan of care.   Function:  Eating Eating   Modified Consistency Diet: Yes Eating Assist Level: Swallowing techniques: self managed;Supervision or verbal cues           Cognition Comprehension Comprehension assist level: Follows basic conversation/direction with no assist  Expression   Expression assist level: Expresses basic needs/ideas: With extra time/assistive device  Social Interaction Social Interaction assist level: Interacts appropriately 90% of the time - Needs monitoring or encouragement for participation or interaction.  Problem Solving Problem solving assist level: Solves basic problems with no assist  Memory Memory assist level: Recognizes or recalls 50 - 74% of the time/requires cueing 25 - 49% of the time    Pain    Therapy/Group: Individual Therapy  Kaden Daughdrill 12/10/2015, 1:55 PM

## 2015-12-10 NOTE — Progress Notes (Signed)
Occupational Therapy Session Note  Patient Details  Name: Derrick [name] MRN: [id number] Date of Birth: March 05, 1951  Today's Date: 12/10/2015 OT Individual Time:  - 1415-1500  (45 min)      Short Term Goals: Week 1:  OT Short Term Goal 1 (Week 1): Pt will complete all bathing and dressing sit to stand with supervision. OT Short Term Goal 1 - Progress (Week 1): Met OT Short Term Goal 2 (Week 1): Pt will complete toilet transfers with supervision using RW to elevated toilet.  OT Short Term Goal 2 - Progress (Week 1): Not met OT Short Term Goal 3 (Week 1): Pt will maintain dynamic standing balance with no more than supervision during simple meal prep.   OT Short Term Goal 3 - Progress (Week 1): Not met OT Short Term Goal 4 (Week 1): Pt will complete tub/shower transfers with supervision using RW and shower bench.   OT Short Term Goal 4 - Progress (Week 1): Not met Week 2:  OT Short Term Goal 1 (Week 2): Continue working on supervision to mod independent level goals for discharge.   Skilled Therapeutic Interventions/Progress Updates:    Skilled OT intervention with treatment focus on the following:  wc mobility, functional balance, endurance, activiy tolerance.  Ppt propelled wc to rehab gym with no assist.  He used nustep for 10 minutes at 4 wkload averaging about 65-70 SPM.  Pt rated his exertion as being light.. He did 684 steps in all  He ambulated to ADL apt  And engaged in simple meal prep(making a sandwich).  He had no LOB during turning , but needed verbal cues to reach in a comfortable range and not over reach such that he loses his balance.  Pt propelled wc back to room and left with all needs in reach.    Therapy Documentation Precautions:  Precautions Precautions: Fall Precaution Comments: L-lean/LOB Restrictions Weight Bearing Restrictions: No     Pain:none          See Function Navigator for Current Functional Status.   Therapy/Group: Individual Therapy  Lisa Roca 12/10/2015, 4:27 PM

## 2015-12-11 ENCOUNTER — Inpatient Hospital Stay (HOSPITAL_COMMUNITY): Payer: Medicare Other | Admitting: Physical Therapy

## 2015-12-11 LAB — GLUCOSE, CAPILLARY
GLUCOSE-CAPILLARY: 117 mg/dL — AB (ref 65–99)
GLUCOSE-CAPILLARY: 114 mg/dL — AB (ref 65–99)
Glucose-Capillary: 164 mg/dL — ABNORMAL HIGH (ref 65–99)
Glucose-Capillary: 159 mg/dL — ABNORMAL HIGH (ref 65–99)

## 2015-12-11 NOTE — Progress Notes (Signed)
Subjective/Complaints: No coughing with thin liq via tsp No CP or SOB   ROS- no , N/V/D, no limb or spine pain Objective: Vital Signs: Blood pressure 125/78, pulse 88, temperature 97.7 F (36.5 C), temperature source Oral, resp. rate 18, height 5\' 8"  (1.727 m), weight 102.967 kg (227 lb), SpO2 100 %. No results found. Results for orders placed or performed during the hospital encounter of 12/01/15 (from the past 72 hour(s))  Glucose, capillary     Status: Abnormal   Collection Time: 12/08/15 11:46 AM  Result Value Ref Range   Glucose-Capillary 154 (H) 65 - 99 mg/dL  Glucose, capillary     Status: Abnormal   Collection Time: 12/08/15  4:46 PM  Result Value Ref Range   Glucose-Capillary 128 (H) 65 - 99 mg/dL  Glucose, capillary     Status: Abnormal   Collection Time: 12/08/15  9:10 PM  Result Value Ref Range   Glucose-Capillary 160 (H) 65 - 99 mg/dL  Glucose, capillary     Status: Abnormal   Collection Time: 12/09/15  6:47 AM  Result Value Ref Range   Glucose-Capillary 157 (H) 65 - 99 mg/dL  Glucose, capillary     Status: Abnormal   Collection Time: 12/09/15 12:42 PM  Result Value Ref Range   Glucose-Capillary 215 (H) 65 - 99 mg/dL  Glucose, capillary     Status: None   Collection Time: 12/09/15  5:03 PM  Result Value Ref Range   Glucose-Capillary 83 65 - 99 mg/dL  Glucose, capillary     Status: Abnormal   Collection Time: 12/09/15  9:19 PM  Result Value Ref Range   Glucose-Capillary 164 (H) 65 - 99 mg/dL  Glucose, capillary     Status: Abnormal   Collection Time: 12/10/15  6:51 AM  Result Value Ref Range   Glucose-Capillary 163 (H) 65 - 99 mg/dL  Glucose, capillary     Status: Abnormal   Collection Time: 12/10/15 11:28 AM  Result Value Ref Range   Glucose-Capillary 206 (H) 65 - 99 mg/dL  Glucose, capillary     Status: Abnormal   Collection Time: 12/10/15  4:57 PM  Result Value Ref Range   Glucose-Capillary 109 (H) 65 - 99 mg/dL  Glucose, capillary     Status:  Abnormal   Collection Time: 12/10/15  9:11 PM  Result Value Ref Range   Glucose-Capillary 157 (H) 65 - 99 mg/dL  Glucose, capillary     Status: Abnormal   Collection Time: 12/11/15  6:43 AM  Result Value Ref Range   Glucose-Capillary 164 (H) 65 - 99 mg/dL     HEENT: normal Cardio: Ireg Irreg and no murmur Resp: CTA B/L and unlabored GI: BS positive and NT, ND Extremity:  Pulses positive and No Edema Skin:   Intact Neuro: Alert/Oriented, Normal Sensory, Normal Motor, Abnormal FMC Ataxic/ dec FMC and Other protracted hiccups Musc/Skel:  Normal Gen NAD   Assessment/Plan: 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 Physiatrist is providing close team supervision and 24 hour management of active medical problems listed below. Physiatrist and rehab team continue to assess barriers to discharge/monitor patient progress toward functional and medical goals. FIM: Function - Bathing Position: Shower Body parts bathed by patient: Right arm, Left arm, Chest, Abdomen, Front perineal area, Buttocks, Right upper leg, Left upper leg, Right lower leg, Left lower leg Bathing not applicable: Back Assist Level: Supervision or verbal cues  Function- Upper Body Dressing/Undressing What is the patient wearing?: Pull over shirt/dress Pull over shirt/dress - Perfomed by patient: Thread/unthread right sleeve, Thread/unthread left sleeve, Put head through opening, Pull shirt over trunk Assist Level: Supervision or verbal cues Set up : To obtain clothing/put away Function - Lower Body Dressing/Undressing What is the patient wearing?: Shoes, Ted Hose Position: Wheelchair/chair at [employer] - Performed by patient: Thread/unthread right underwear leg, Thread/unthread left underwear leg, Pull underwear up/down Pants- Performed by patient: Thread/unthread right pants leg, Thread/unthread  left pants leg, Pull pants up/down, Fasten/unfasten pants Non-skid slipper socks- Performed by patient: Don/doff right sock, Don/doff left sock Socks - Performed by patient: Don/doff left sock, Don/doff right sock Shoes - Performed by patient: Don/doff right shoe, Don/doff left shoe, Fasten right, Fasten left TED Hose - Performed by patient: Don/doff right TED hose, Don/doff left TED hose Assist for footwear: Supervision/touching assist Assist for lower body dressing: Supervision or verbal cues  Function - Toileting Toileting activity did not occur: Refused Toileting steps completed by patient: Performs perineal hygiene Toileting steps completed by helper: Adjust clothing prior to toileting, Adjust clothing after toileting Toileting Assistive Devices: Grab bar or rail Assist level: Touching or steadying assistance (Pt.75%)  Function - Air cabin crew transfer activity did not occur: Refused Toilet transfer assistive device: Grab bar Assist level to toilet: Supervision or verbal cues Assist level from toilet: Supervision or verbal cues  Function - Chair/bed transfer Chair/bed transfer method: Stand pivot Chair/bed transfer assist level: Moderate assist (Pt 50 - 74%/lift or lower) Chair/bed transfer assistive device: Armrests, Walker Chair/bed transfer details: Manual facilitation for weight shifting, Verbal cues for sequencing  Function - Locomotion: Wheelchair Will patient use wheelchair at discharge?: No Type: Manual Max wheelchair distance: 225 Assist Level: No help, No cues, assistive device, takes more than reasonable amount of time Assist Level: Supervision or verbal cues Assist Level: Supervision or verbal cues Turns around,maneuvers to table,bed, and toilet,negotiates 3% grade,maneuvers on rugs and over doorsills: No Function - Locomotion: Ambulation Assistive device: Walker-rolling Max distance: 150 Assist level: Moderate assist (Pt 50 - 74%) Assist level:  Touching or steadying assistance (Pt > 75%) Walk 50 feet with 2 turns activity did not occur: Safety/medical concerns Assist level: Touching or steadying assistance (Pt > 75%) Walk 150 feet activity did not occur: Safety/medical concerns Assist level: Moderate assist (Pt 50 - 74%) Walk 10 feet on uneven surfaces activity did not occur: Safety/medical concerns Assist level: Moderate assist (Pt 50 - 74%)  Function - Comprehension Comprehension: Auditory Comprehension assist level: Understands basic 90% of the time/cues < 10% of the time  Function - Expression Expression: Verbal Expression assist level: Expresses basic 90% of the time/requires cueing < 10% of the time.  Function - Social Interaction Social Interaction assist level: Interacts appropriately 90% of the time - Needs monitoring or encouragement for participation or interaction.  Function - Problem Solving Problem solving assist level: Solves basic 90% of the time/requires cueing < 10% of the time  Function - Memory Memory assist level: Recognizes or recalls 25 - 49% of the time/requires cueing 50 - 75% of the time Patient normally able to recall (first 3 days only): Current season    Medical Problem List and Plan: 1.  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  DVT Prophylaxis/Anticoagulation: Subcutaneous Lovenox. Monitor platelet counts and any signs of bleeding 3. Pain Management:  Tylenol as needed 4. Dysphagia. Dysphagia 3 nectar liquids .Follow-up speech therapy. Now on thin liq via teaspoon, no signs of aspiration 5. Neuropsych: This patient is capable of making decisions on his own behalf. 6. Skin/Wound Care: Routine skin checks 7. Fluids/Electrolytes/Nutrition: Routine I&O's with follow-up chemistries Recheck BMET Pt on metformin and ACE I  8. Diabetes mellitus peripheral neuropathy.  Hemoglobin A1c 6.1.Tradjenta 5 mg daily, Glucophage 1000 mg twice a day. Check blood sugars before meals and at bedtime. Diabetic teaching, controlled CBG (last 3)   Recent Labs  12/10/15 1657 12/10/15 2111 12/11/15 0643  GLUCAP 109* 157* 164*    9. Hypertension. Norvasc 10 mg daily, , lisinopril 20 mg once a day. Monitor with increased mobility, still bradycardic with BP on low side will D/C Lopressor , HR is in acceptable range,  may need to resume low dose, BP still running low will reduce ACE I  Filed Vitals:   12/10/15 1453 12/11/15 0612  BP: 118/76 125/78  Pulse: 98 88  Temp: 97.7 F (36.5 C) 97.7 F (36.5 C)  Resp: 18 18   10. CAD. Status post PCI. Continue aspirin and Plavix. No chest pain or shortness of breath, monitor with increased walking distance 11. Hypothyroidism. Continue Synthroid. 12. Hiccups. Increased baclofen 15mg  to QID on 5/22, hiccups better but c/o dizziness, will change to prn 13. Hyperlipidemia. Lipitor 14.  Dizziness- bradycardia plus baclofen, will also check orthostatics  LOS (Days) 10 A FACE TO FACE EVALUATION WAS PERFORMED  Jacoba Cherney E 12/11/2015, 10:33 AM

## 2015-12-11 NOTE — Progress Notes (Signed)
Physical Therapy Session Note  Patient Details  Name: Derrick [name] MRN: [id number] Date of Birth: 04/07/1951  Today's Date: 12/11/2015 PT Individual Time: 1415-1500 PT Individual Time Calculation (min): 45 min   Short Term Goals: Week 1:  PT Short Term Goal 1 (Week 1): STG=LTG due to LOS, progress towards Mod I for all mobility  Skilled Therapeutic Interventions/Progress Updates:  Pt was seen bedside in the pm. Pt propelled w/c about 200 feet with B UEs and S. Pt performed multiple sit to stand and stand pivot transfers with/without rolling walker in controlled environment with min A and verbal cues. Performed Berg Balance test pt scored 27/56. Pt ambulated 200 feet without assistive device and min A with verbal cues. Pt ambulated 200 feet with rolling walker and min guard with verbal cues. Pt rode nu-step x 10 minutes at level 4. Pt propelled w/c back to room with B UEs and S.   Therapy Documentation Precautions:  Precautions Precautions: Fall Precaution Comments: L-lean/LOB Restrictions Weight Bearing Restrictions: No General:   Pain: No c/o pain.     Balance: Standardized Balance Assessment Standardized Balance Assessment: Berg Balance Test Berg Balance Test Sit to Stand: Able to stand  independently using hands Standing Unsupported: Able to stand 30 seconds unsupported Sitting with Back Unsupported but Feet Supported on Floor or Stool: Able to sit safely and securely 2 minutes Stand to Sit: Controls descent by using hands Transfers: Able to transfer with verbal cueing and /or supervision Standing Unsupported with Eyes Closed: Able to stand 10 seconds with supervision Standing Ubsupported with Feet Together: Needs help to attain position and unable to hold for 15 seconds From Standing, Reach Forward with Outstretched Arm: Reaches forward but needs supervision From Standing Position, Pick up Object from Floor: Able to pick up shoe, needs supervision From Standing Position,  Turn to Look Behind Over each Shoulder: Looks behind from both sides and weight shifts well Turn 360 Degrees: Needs close supervision or verbal cueing Standing Unsupported, Alternately Place Feet on Step/Stool: Able to complete >2 steps/needs minimal assist Standing Unsupported, One Foot in Front: Loses balance while stepping or standing Standing on One Leg: Unable to try or needs assist to prevent fall Total Score: 27  See Function Navigator for Current Functional Status.   Therapy/Group: Individual Therapy  Dub Amis 12/11/2015, 2:49 PM

## 2015-12-12 ENCOUNTER — Inpatient Hospital Stay (HOSPITAL_COMMUNITY): Payer: Self-pay | Admitting: Physical Therapy

## 2015-12-12 ENCOUNTER — Inpatient Hospital Stay (HOSPITAL_COMMUNITY): Payer: Medicare Other | Admitting: Speech Pathology

## 2015-12-12 ENCOUNTER — Inpatient Hospital Stay (HOSPITAL_COMMUNITY): Payer: Medicare Other | Admitting: Occupational Therapy

## 2015-12-12 LAB — GLUCOSE, CAPILLARY
GLUCOSE-CAPILLARY: 163 mg/dL — AB (ref 65–99)
GLUCOSE-CAPILLARY: 130 mg/dL — AB (ref 65–99)
Glucose-Capillary: 174 mg/dL — ABNORMAL HIGH (ref 65–99)
Glucose-Capillary: 74 mg/dL (ref 65–99)

## 2015-12-12 LAB — BASIC METABOLIC PANEL
Anion gap: 6 (ref 5–15)
BUN: 18 mg/dL (ref 6–20)
CHLORIDE: 108 mmol/L (ref 101–111)
CO2: 24 mmol/L (ref 22–32)
Calcium: 9.3 mg/dL (ref 8.9–10.3)
Creatinine, Ser: 1.4 mg/dL — ABNORMAL HIGH (ref 0.61–1.24)
GFR calc Af Amer: 59 mL/min — ABNORMAL LOW (ref >60)
GFR calc non Af Amer: 51 mL/min — ABNORMAL LOW (ref >60)
Glucose, Bld: 244 mg/dL — ABNORMAL HIGH (ref 65–99)
POTASSIUM: 4.3 mmol/L (ref 3.5–5.1)
SODIUM: 138 mmol/L (ref 135–145)

## 2015-12-12 MED ORDER — METOPROLOL TARTRATE 12.5 MG HALF TABLET
12.5000 mg | ORAL_TABLET | Freq: Two times a day (BID) | ORAL | Status: DC
  Administered 2015-12-12 – 2015-12-13 (×3): 12.5 mg via ORAL
  Filled 2015-12-12 (×3): qty 1

## 2015-12-12 MED ORDER — BACLOFEN 10 MG PO TABS: 10.0000 mg | ORAL_TABLET | Freq: Four times a day (QID) | ORAL | Status: DC | PRN

## 2015-12-12 MED ORDER — LISINOPRIL 10 MG PO TABS
10.0000 mg | ORAL_TABLET | Freq: Every day | ORAL | Status: DC
  Administered 2015-12-13: 10 mg via ORAL
  Filled 2015-12-12: qty 1

## 2015-12-12 NOTE — Progress Notes (Signed)
Physical Therapy Discharge Summary  Patient Details  Name: Derrick [name] MRN: [id number] Date of Birth: [birth year]  Today's Date: 12/12/2015 PT Individual Time: 4010-2725 PT Individual Time Calculation (min): 71 min    Patient has met 7 of 9 long term goals due to improved activity tolerance, improved balance, improved postural control, increased strength and improved awareness.  Patient to discharge at an ambulatory level Supervision.   Patient's care partner is independent to provide the necessary physical assistance at discharge. Pt completed family training with OT this morning.  Reasons goals not met: Decreased balance & awareness of L lateral lean, decreased compensatory strategies.  Recommendation:  Patient will benefit from ongoing skilled PT services in outpatient setting to continue to advance safe functional mobility, address ongoing impairments in decreased balance, decreased compensatory strategies for loss of balance, impaired gait quality, decreased endurance, impaired LLE coordination & activation, and minimize fall risk.  Equipment: RW  Reasons for discharge: treatment goals met  Patient/family agrees with progress made and goals achieved: Yes  Skilled PT Treatment: Pt received in bathroom & agreeable to PT, denying c/o pain. Pt able to complete all transfers (sit<>stand, stand pivot with RW, & car) with supervision A. Pt negotiated 12 steps, 6 inches in height, with B rails & supervision A. Pt able to ambulate >150 ft with RW & supervision A requiring intermittent Min A to correct LOB as pt demonstrates decreased step length & decreased foot clearance LLE when ambulating at increased speeds & experiences loss of balance to L. PT thoroughly educated pt on need to decrease gait speed to prevent loss of balance, as pt able to ambulate with supervision when moving slowly. Pt able to complete bed mobility at Mod I level. Pt's balance has improved as noted by his increased score  on Berg Balance Test to 35/56 on this date. Educated pt on continued fall risk & need for RW & assistance when ambulating & pt voiced understanding. Patient demonstrates increased fall risk as noted by score of 35/56 on Berg Balance Scale.  (<36= high risk for falls, close to 100%; 37-45 significant >80%; 46-51 moderate >50%; 52-55 lower >25%). Pt voices no concerns regarding d/c home & plans to attend Outpatient PT, which this therapist recommends along with supervision with functional mobility. At end of session pt left in bed with all needs within reach.   5x sit-to-stand without BUE support = 21 seconds.   PT Discharge Precautions/Restrictions Precautions Precautions: Fall Precaution Comments: L-lean/LOB Restrictions Weight Bearing Restrictions: No  Pain Pain Assessment Pain Assessment: No/denies pain  Vision/Perception  Vision - Assessment Reports no changes in vision compared to baseline.  Cognition Overall Cognitive Status: Within Functional Limits for tasks assessed Arousal/Alertness: Awake/alert Orientation Level: Oriented to time;Oriented to place;Oriented to person;Oriented to situation;Oriented X4 Attention: Selective;Sustained Sustained Attention: Appears intact Selective Attention: Appears intact Memory: Appears intact Awareness: Appears intact Problem Solving: Appears intact Safety/Judgment: Appears intact  Sensation Sensation Light Touch: Appears Intact (BLE) Proprioception: Appears Intact (BLE) Coordination Coordination and Movement Description:  (rapid alternating movements impaired LUE) Finger Nose Finger Test:  (6 seconds BUE, but difficulty maintaining linear path with finger with LUE) Heel Shin Test:  (5 seconds RLE, 6 seconds LLE)  Motor  Motor Motor: Abnormal postural alignment and control Motor - Skilled Clinical Observations:  (1-2 beats of clonus noted LLE) Motor - Discharge Observations: increased lean to the left in standing and with dynamic  activities   Mobility Bed Mobility Bed Mobility: Rolling Right;Rolling Left;Supine to Sit;Sit  to Supine (regular bed) Rolling Right: 6: Modified independent (Device/Increase time) Rolling Left: 6: Modified independent (Device/Increase time) Supine to Sit: 6: Modified independent (Device/Increase time) Sit to Supine: 6: Modified independent (Device/Increase time) Transfers Transfers: Yes Sit to Stand: 5: Supervision (cuing for proper hand placement) Stand to Sit: 5: Supervision (cuing for proper hand placement)  Locomotion  Ambulation Ambulation: Yes Ambulation/Gait Assistance: 5: Supervision Ambulation Distance (Feet): 150 Feet Assistive device: Rolling walker Ambulation/Gait Assistance Details:  (verbal cuing for increased step length LLE) Gait Gait: Yes Gait Pattern: Decreased step length - left (intermittent decreased foot clearance LLE) Stairs / Additional Locomotion Stairs: Yes Stairs Assistance: 5: Supervision Stair Management Technique: Two rails Number of Stairs: 12 Height of Stairs: 6 Wheelchair Mobility Wheelchair Mobility: No   Trunk/Postural Assessment  Cervical Assessment Cervical Assessment: Exceptions to [facility] (Still with slight head tilt to the right to assist with balance during dynamic tasks) Thoracic Assessment Thoracic Assessment: Within Functional Limits Lumbar Assessment Lumbar Assessment: Exceptions to WFL (slight kyphosis) Postural Control Righting Reactions: Delayed righting reactions during dynamic tasks with frequent lean to the left and LOB   Balance Balance Balance Assessed: Yes Berg Balance Test Sit to Stand: Able to stand without using hands and stabilize independently Standing Unsupported: Able to stand safely 2 minutes Sitting with Back Unsupported but Feet Supported on Floor or Stool: Able to sit safely and securely 2 minutes Stand to Sit: Sits independently, has uncontrolled descent Transfers: Able to transfer safely, definite need  of hands Standing Unsupported with Eyes Closed: Able to stand 10 seconds safely Standing Ubsupported with Feet Together: Able to place feet together independently but unable to hold for 30 seconds From Standing, Reach Forward with Outstretched Arm: Can reach confidently >25 cm (10") From Standing Position, Pick up Object from Floor: Able to pick up shoe, needs supervision From Standing Position, Turn to Look Behind Over each Shoulder: Looks behind from both sides and weight shifts well Turn 360 Degrees: Needs close supervision or verbal cueing (6 seconds required to turn to 1 side) Standing Unsupported, Alternately Place Feet on Step/Stool: Able to complete >2 steps/needs minimal assist Standing Unsupported, One Foot in Front: Loses balance while stepping or standing Standing on One Leg: Unable to try or needs assist to prevent fall Total Score: 35 Normal TUG = 28 seconds (with RW)  Extremity Assessment  RLE Assessment RLE Assessment: Exceptions to [facility] RLE AROM (degrees) Overall AROM Right Lower Extremity: Within functional limits for tasks assessed RLE Strength Right Hip Flexion: 4/5 Right Knee Flexion: 4/5 Right Knee Extension: 4/5 Right Ankle Dorsiflexion: 4/5 LLE Assessment LLE Assessment: Exceptions to WFL LLE AROM (degrees) Overall AROM Left Lower Extremity: Within functional limits for tasks assessed LLE Strength Left Hip Flexion: 3+/5 Left Knee Flexion: 4-/5 Left Knee Extension: 3+/5 Left Ankle Dorsiflexion: 4-/5   See Function Navigator for Current Functional Status.  Waunita Schooner 12/12/2015, 1:15 PM

## 2015-12-12 NOTE — Progress Notes (Signed)
Social Work Patient ID: Derrick [name], male   DOB: 1951-02-17, 65 y.o.   MRN: [id number] Daughter's here for family training in preparation for discharge tomorrow. Agreeable to equipment-rolling walker, bedside commode and tub bench. Daughter can provide transportation to OP therapies. Will make referral to [facility] for equipment and [facility] for follow up therapies, they are closed today so will make in am. All in agreement with the plans and pt feels comfortable with discharge tomorrow.

## 2015-12-12 NOTE — Progress Notes (Signed)
Speech Language Pathology Discharge Summary  Patient Details  Name: Derrick [name] MRN: [id number] Date of Birth: 21-Mar-1951    Patient has met 5 of 5 long term goals.  Patient to discharge at overall Modified Independent level.  Reasons goals not met:   n/a Clinical Impression/Discharge Summary:   Pt has made excellent gains while inpatient and is discharging having met 5 out of 5 long term goals. Pt's diet has been upgraded to dys 3 textures and thin liquids via teaspoon, which pt is consuming with mod I use of swallowing precautions (multiple swallows, small bites, intermittent throat clear). Pt continues to present with a moderate pharyngeal (primarily sensory based) and cervical-esophageal dysphagia and remains at risk of intermittent aspiration across all consistencies given multifactorial deficits noted both on FEES and MBS. Pt would benefit from GI and ENT follow up for further assessment of esophageal motility and vocal cord function. Prognosis for advancement good with strict ongoing use of compensatory strategies, ST follow up for diet advancement following repeat objective assessment, and with increased time post onset to allow for further recovery. Pt education is complete at this time. Pt is discharging home with 24/7 supervision from his family.   Care Partner:  Caregiver Able to Provide Assistance: Yes  Type of Caregiver Assistance: Physical;Cognitive  Recommendation:  Outpatient SLP;Home Health SLP  Rationale for SLP Follow Up: Maximize swallowing safety   Equipment: none recommended by SLP    Reasons for discharge: Discharged from hospital   Patient/Family Agrees with Progress Made and Goals Achieved: Yes   Function:  Eating Eating   Modified Consistency Diet: Yes Eating Assist Level: Swallowing techniques: self managed           Cognition Comprehension Comprehension assist level: Follows complex conversation/direction with extra time/assistive device   Expression   Expression assist level: Expresses basic needs/ideas: With no assist  Social Interaction Social Interaction assist level: Interacts appropriately with others with medication or extra time (anti-anxiety, antidepressant).  Problem Solving Problem solving assist level: Solves basic problems with no assist  Memory Memory assist level: Recognizes or recalls 90% of the time/requires cueing < 10% of the time   [name] 12/12/2015, 12:34 PM

## 2015-12-12 NOTE — Plan of Care (Signed)
Problem: RH Floor Transfers Goal: LTG Patient will perform floor transfers w/assist (PT) LTG: Patient will perform floor transfers with assistance (PT).  Outcome: Not Met (add Reason) Not performed during therapy treatment

## 2015-12-12 NOTE — Plan of Care (Signed)
Problem: RH Ambulation Goal: LTG Patient will ambulate in community environment (PT) LTG: Patient will ambulate in community environment, # of feet with assistance (PT).  Outcome: Not Met (add Reason) 100 ft Min A with RW; not met 2/2 decreased balance  Problem: RH Stairs Goal: LTG Patient will ambulate up and down stairs w/assist (PT) LTG: Patient will ambulate up and down # of stairs with assistance (PT)  Outcome: Completed/Met Date Met:  12/12/15 12 steps with B rails

## 2015-12-12 NOTE — Progress Notes (Signed)
Occupational Therapy Discharge Summary  Patient Details  Name: Derrick [name] MRN: [id number] Date of Birth: Jan 30, 1951  Today's Date: 12/12/2015 OT Individual Time: 1100-1158 OT Individual Time Calculation (min): 58 min    Session Note:  Pt completed bathing, dressing, and grooming during session.  He utilized RW for mobility with close supervision to and from the bathroom and to the wheelchair at the sink.  He used tub bench for bathing and sat in the wheelchair and bedside chair for all dressing.  Pt's family in at end of dressing.  Educated them on current need for supervision with transfers and with use of the RW.  Pt ambulated down to the tub room for practice with tub/shower bench for transfers.  Discussed the need for a hand held shower as well as suction cup grab bar for safety.  Also provided walker bag for use to carry things.  Second part of session had pt work on dynamic standing balance with pt alternating stepping up on 4-5" step with each LE.  Increased LOB to the left when attempting to lift the RLE.  If pt attempted to maintain the LLE up he would lose his balance forward and the the left, requiring mod assist to correct.  Finished session with functional mobility around the gym while having pt hold beach ball with both hands.  Min guard assist to complete this with no LOB.  Had pt ambulate back to his room while his daughter tossed him and rolled him the ball.  Significant LOB noted to the right, left, and posteriorly, especially with reaching down to the floor.  Pt left in recliner with family present.    Patient has met 11 of 11 long term goals due to improved activity tolerance, improved balance, improved awareness and improved coordination.  Patient to discharge at overall Supervision level.  Patient's care partner is independent to provide the necessary physical assistance at discharge.    Reasons goals not met: NA  Recommendation:  Patient will benefit from ongoing skilled OT  services in outpatient setting to continue to advance functional skills in the area of BADL, iADL and Reduce care partner burden.  Pt still with decreased dynamic balance with selfcare tasks with the current need for DME for safety.  Feel he needs continued OT to progress back to modified independent/independent level for return back to previous level.    Equipment: tub bench, 3:1  Reasons for discharge: treatment goals met and discharge from hospital  Patient/family agrees with progress made and goals achieved: Yes  OT Discharge Precautions/Restrictions  Precautions Precautions: Fall Precaution Comments: L-lean/LOB Restrictions Weight Bearing Restrictions: No  Vital Signs Therapy Vitals Pulse Rate: 84 Oxygen Therapy SpO2: 98 % O2 Device: Not Delivered Pulse Oximetry Type: Intermittent Pain Pain Assessment Pain Assessment: No/denies pain ADL  See Function Section of Chart  Vision/Perception  Vision- History Baseline Vision/History: No visual deficits Patient Visual Report: No change from baseline Vision- Assessment Vision Assessment?: Yes Eye Alignment: Within Functional Limits Ocular Range of Motion: Within Functional Limits Tracking/Visual Pursuits: Able to track stimulus in all quads without difficulty Saccades: Within functional limits Convergence: Within functional limits Visual Fields: No apparent deficits  Cognition Overall Cognitive Status: Within Functional Limits for tasks assessed Arousal/Alertness: Awake/alert Orientation Level: Oriented X4 Attention: Selective;Sustained Sustained Attention: Appears intact Selective Attention: Appears intact Memory: Appears intact Awareness: Appears intact Problem Solving: Appears intact Safety/Judgment: Appears intact Sensation Sensation Light Touch: Appears Intact Stereognosis: Appears Intact Hot/Cold: Appears Intact Proprioception: Appears Intact Coordination Gross  Motor Movements are Fluid and Coordinated:  Yes (In relation to BUEs) Fine Motor Movements are Fluid and Coordinated: Yes Motor  Motor Motor: Abnormal postural alignment and control Motor - Discharge Observations: increased lean to the left in standing and with dynamic activities Mobility  Bed Mobility Supine to Sit: 6: Modified independent (Device/Increase time) Sit to Supine: 6: Modified independent (Device/Increase time) Transfers Transfers: Sit to Stand Sit to Stand: With armrests;With upper extremity assist;From chair/3-in-1;6: Modified independent (Device/Increase time) Stand to Sit: 6: Modified independent (Device/Increase time);With armrests;With upper extremity assist;To chair/3-in-1  Trunk/Postural Assessment  Cervical Assessment Cervical Assessment: Exceptions to [facility] (Still with slight head tilt to the right to assist with balance during dynamic tasks) Thoracic Assessment Thoracic Assessment: Within Functional Limits Lumbar Assessment Lumbar Assessment: Exceptions to WFL (slight kyphosis) Postural Control Righting Reactions: Delayed righting reactions during dynamic tasks with frequent lean to the left and LOB  Balance Balance Balance Assessed: Yes Static Sitting Balance Static Sitting - Balance Support: Feet unsupported;No upper extremity supported Static Sitting - Level of Assistance: 6: Modified independent (Device/Increase time) Dynamic Sitting Balance Dynamic Sitting - Balance Support: No upper extremity supported;Feet unsupported Dynamic Sitting - Level of Assistance: 6: Modified independent (Device/Increase time) Static Standing Balance Static Standing - Balance Support: Right upper extremity supported;Left upper extremity supported Static Standing - Level of Assistance: 6: Modified independent (Device/Increase time) Dynamic Standing Balance Dynamic Standing - Balance Support: No upper extremity supported Dynamic Standing - Level of Assistance: 4: Min assist Dynamic Standing - Comments: Dynamic standing  balance varies from close supervision to mod assist depending on the demands of the task. Extremity/Trunk Assessment RUE Assessment RUE Assessment: Within Functional Limits LUE Assessment LUE Assessment: Within Functional Limits   See Function Navigator for Current Functional Status.  Alma Mohiuddin OTR/L 12/12/2015, 12:47 PM

## 2015-12-12 NOTE — Progress Notes (Signed)
Subjective/Complaints: No dizziness Discussed BP meds No CP or SOB   ROS- no , N/V/D, no limb or spine pain Objective: Vital Signs: Blood pressure 125/98, pulse 91, temperature 97.8 F (36.6 C), temperature source Oral, resp. rate 18, height 5\' 8"  (1.727 m), weight 103.477 kg (228 lb 2 oz), SpO2 98 %. No results found. Results for orders placed or performed during the hospital encounter of 12/01/15 (from the past 72 hour(s))  Glucose, capillary     Status: Abnormal   Collection Time: 12/09/15 12:42 PM  Result Value Ref Range   Glucose-Capillary 215 (H) 65 - 99 mg/dL  Glucose, capillary     Status: None   Collection Time: 12/09/15  5:03 PM  Result Value Ref Range   Glucose-Capillary 83 65 - 99 mg/dL  Glucose, capillary     Status: Abnormal   Collection Time: 12/09/15  9:19 PM  Result Value Ref Range   Glucose-Capillary 164 (H) 65 - 99 mg/dL  Glucose, capillary     Status: Abnormal   Collection Time: 12/10/15  6:51 AM  Result Value Ref Range   Glucose-Capillary 163 (H) 65 - 99 mg/dL  Glucose, capillary     Status: Abnormal   Collection Time: 12/10/15 11:28 AM  Result Value Ref Range   Glucose-Capillary 206 (H) 65 - 99 mg/dL  Glucose, capillary     Status: Abnormal   Collection Time: 12/10/15  4:57 PM  Result Value Ref Range   Glucose-Capillary 109 (H) 65 - 99 mg/dL  Glucose, capillary     Status: Abnormal   Collection Time: 12/10/15  9:11 PM  Result Value Ref Range   Glucose-Capillary 157 (H) 65 - 99 mg/dL  Glucose, capillary     Status: Abnormal   Collection Time: 12/11/15  6:43 AM  Result Value Ref Range   Glucose-Capillary 164 (H) 65 - 99 mg/dL  Glucose, capillary     Status: Abnormal   Collection Time: 12/11/15 11:38 AM  Result Value Ref Range   Glucose-Capillary 117 (H) 65 - 99 mg/dL   Comment 1 Notify RN   Glucose, capillary     Status: Abnormal   Collection Time: 12/11/15  4:48 PM  Result Value Ref Range   Glucose-Capillary 159 (H) 65 - 99 mg/dL   Glucose, capillary     Status: Abnormal   Collection Time: 12/11/15  9:06 PM  Result Value Ref Range   Glucose-Capillary 114 (H) 65 - 99 mg/dL  Glucose, capillary     Status: Abnormal   Collection Time: 12/12/15  6:38 AM  Result Value Ref Range   Glucose-Capillary 174 (H) 65 - 99 mg/dL     HEENT: normal Cardio: Ireg Irreg and no murmur Resp: CTA B/L and unlabored GI: BS positive and NT, ND Extremity:  Pulses positive and No Edema Skin:   Intact Neuro: Alert/Oriented, Normal Sensory, Normal Motor, Abnormal FMC Ataxic/ dec FMC and Other protracted hiccups Musc/Skel:  Normal Gen NAD   Assessment/Plan: 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 Physiatrist is providing close team supervision and 24 hour management of active medical problems listed below. Physiatrist and rehab team continue to assess barriers to discharge/monitor patient progress toward functional and medical goals. FIM: Function - Bathing Position: Shower Body parts bathed by patient: Right arm, Left arm, Chest, Abdomen, Front perineal area, Buttocks, Right upper leg, Left upper leg, Right lower leg, Left lower leg Bathing not applicable:  Back Assist Level: Supervision or verbal cues  Function- Upper Body Dressing/Undressing What is the patient wearing?: Pull over shirt/dress Pull over shirt/dress - Perfomed by patient: Thread/unthread right sleeve, Thread/unthread left sleeve, Put head through opening, Pull shirt over trunk Assist Level: Supervision or verbal cues Set up : To obtain clothing/put away Function - Lower Body Dressing/Undressing What is the patient wearing?: Shoes, Ted Hose Position: Wheelchair/chair at [employer] - Performed by patient: Thread/unthread right underwear leg, Thread/unthread left underwear leg, Pull underwear up/down Pants- Performed by patient: Thread/unthread  right pants leg, Thread/unthread left pants leg, Pull pants up/down, Fasten/unfasten pants Non-skid slipper socks- Performed by patient: Don/doff right sock, Don/doff left sock Socks - Performed by patient: Don/doff left sock, Don/doff right sock Shoes - Performed by patient: Don/doff right shoe, Don/doff left shoe, Fasten right, Fasten left TED Hose - Performed by patient: Don/doff right TED hose, Don/doff left TED hose Assist for footwear: Supervision/touching assist Assist for lower body dressing: Supervision or verbal cues  Function - Toileting Toileting activity did not occur: Refused Toileting steps completed by patient: Performs perineal hygiene Toileting steps completed by helper: Adjust clothing prior to toileting, Adjust clothing after toileting Toileting Assistive Devices: Grab bar or rail Assist level: Touching or steadying assistance (Pt.75%)  Function - Air cabin crew transfer activity did not occur: Refused Toilet transfer assistive device: Grab bar Assist level to toilet: Supervision or verbal cues Assist level from toilet: Supervision or verbal cues  Function - Chair/bed transfer Chair/bed transfer method: Stand pivot Chair/bed transfer assist level: Touching or steadying assistance (Pt > 75%) Chair/bed transfer assistive device: Armrests, Walker Chair/bed transfer details: Manual facilitation for weight shifting, Verbal cues for sequencing  Function - Locomotion: Wheelchair Will patient use wheelchair at discharge?: No Type: Manual Max wheelchair distance: 200 Assist Level: Supervision or verbal cues Assist Level: Supervision or verbal cues Assist Level: Supervision or verbal cues Turns around,maneuvers to table,bed, and toilet,negotiates 3% grade,maneuvers on rugs and over doorsills: No Function - Locomotion: Ambulation Assistive device: Walker-rolling Max distance: 200 Assist level: Touching or steadying assistance (Pt > 75%) Assist level: Touching  or steadying assistance (Pt > 75%) Walk 50 feet with 2 turns activity did not occur: Safety/medical concerns Assist level: Touching or steadying assistance (Pt > 75%) Walk 150 feet activity did not occur: Safety/medical concerns Assist level: Touching or steadying assistance (Pt > 75%) Walk 10 feet on uneven surfaces activity did not occur: Safety/medical concerns Assist level: Moderate assist (Pt 50 - 74%)  Function - Comprehension Comprehension: Auditory Comprehension assist level: Understands basic 90% of the time/cues < 10% of the time  Function - Expression Expression: Verbal Expression assist level: Expresses basic 90% of the time/requires cueing < 10% of the time.  Function - Social Interaction Social Interaction assist level: Interacts appropriately 90% of the time - Needs monitoring or encouragement for participation or interaction.  Function - Problem Solving Problem solving assist level: Solves basic 90% of the time/requires cueing < 10% of the time  Function - Memory Memory assist level: Recognizes or recalls 25 - 49% of the time/requires cueing 50 - 75% of the time Patient normally able to recall (first 3 days only): Current season    Medical Problem List and Plan: 1.  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  DVT Prophylaxis/Anticoagulation: Subcutaneous Lovenox. Monitor platelet counts and any signs of bleeding 3. Pain Management:  Tylenol as needed 4. Dysphagia. Dysphagia 3 nectar liquids .Follow-up speech therapy. Now on thin liq via teaspoon, no signs of aspiration 5. Neuropsych: This patient is capable of making decisions on his own behalf. 6. Skin/Wound Care: Routine skin checks 7. Fluids/Electrolytes/Nutrition: Routine I&O's with follow-up chemistries Recheck BMET Pt on metformin and ACE I  8. Diabetes mellitus peripheral  neuropathy. Hemoglobin A1c 6.1.Tradjenta 5 mg daily, Glucophage 1000 mg twice a day. Check blood sugars before meals and at bedtime. Diabetic teaching, controlled CBG (last 3)   Recent Labs  12/11/15 1648 12/11/15 2106 12/12/15 0638  GLUCAP 159* 114* 174*    9. Hypertension. Norvasc 10 mg daily, , lisinopril 20 mg once a day. Monitor with increased mobility,BP still on low side, HR is creeping up,   need to resume low dose BB and , BP still running low will reduce Lisinopril to 10mg   Filed Vitals:   12/11/15 1347 12/12/15 0618  BP: 107/79 125/98  Pulse: 107 91  Temp: 98 F (36.7 C) 97.8 F (36.6 C)  Resp: 19 18   10. CAD. Status post PCI. Continue aspirin and Plavix. No chest pain or shortness of breath, monitor with increased walking distance 11. Hypothyroidism. Continue Synthroid. 12. Hiccups. Increased baclofen 15mg  to QID on 5/22, hiccups better but c/o dizziness, will change to10mg  prn 13. Hyperlipidemia. Lipitor 14.  Dizziness- bradycardia plus baclofen, will also check orthostatics  LOS (Days) 11 A FACE TO FACE EVALUATION WAS PERFORMED  KIRSTEINS,ANDREW E 12/12/2015, 8:12 AM

## 2015-12-12 NOTE — Progress Notes (Signed)
Speech Language Pathology Daily Note   Patient Details  Name: Derrick [name] MRN: [id number] Date of Birth: 1951/01/01  Today's Date: 12/12/2015 SLP Individual Time: 0905 - 1000 SLP Individual Time Calculation (min): 55 min    Skilled Therapeutic Interventions:  Pt was seen for skilled ST targeting goals for dysphagia and completion of pt education prior to discharge home.  Pt's daughter's not present for session so handouts were provided to maximize carryover in the home environment.  Pt was observed with dys 3 textures and thin liquids via teaspoon to assess toleration of upgrade.  Pt independently utilized swallowing precautions and demonstrated no overt s/s of aspiration with solids or liquids.  SLP discussed pt's risk of intermittent aspiration and how to check for s/s of a developing pneumonia.  SLP also discussed in depth dys 3 textures that he could easily keep accessible at home.  All questions were answered to pt's satisfaction at this time.  Pt left in recliner with call bell within reach.      Function:  Eating Eating   Modified Consistency Diet: Yes Eating Assist Level: Swallowing techniques: self managed           Cognition Comprehension Comprehension assist level: Follows complex conversation/direction with extra time/assistive device  Expression   Expression assist level: Expresses basic needs/ideas: With no assist  Social Interaction Social Interaction assist level: Interacts appropriately with others with medication or extra time (anti-anxiety, antidepressant).  Problem Solving Problem solving assist level: Solves basic problems with no assist  Memory Memory assist level: Recognizes or recalls 90% of the time/requires cueing < 10% of the time   [name] 12/12/2015, 12:28 PM

## 2015-12-12 NOTE — Progress Notes (Signed)
Speech Language Pathology Daily Session Note  Patient Details  Name: Derrick [name] MRN: [id number] Date of Birth: 26-May-1951  Today's Date: 12/12/2015 SLP Individual Time: 1032-1057 SLP Individual Time Calculation (min): 25 min  Short Term Goals: Week 2: SLP Short Term Goal 1 (Week 2): Pt will consume dys 3 textures and thin liquids via teaspoon with mod I use of swallowing precautions and minimal overt s/s of aspiration.   SLP Short Term Goal 2 (Week 2): Pt will demonstrate sustained phonation with mod I for >5 seconds.   Skilled Therapeutic Interventions: Skilled treatment session focused on dysphagia goals. SLP facilitated session by providing skilled observation with thin liquids via teaspoon to assess toleration of upgrade. Pt independently utilized swallowing precautions and demonstrated no overt s/s of aspiration. Patient's hiccups began after several trials, therefore, trials were discontinued to maximize safety. Patient recalled swallowing compensatory strategies that he was educated on in previous SLP session with Mod I and asked appropriate questions in regards to d/c planning. Patient left upright in recliner with all needs within reach. Continue with current plan of care.     Function:  Eating Eating   Modified Consistency Diet: Yes Eating Assist Level: Swallowing techniques: self managed           Cognition Comprehension Comprehension assist level: Follows complex conversation/direction with extra time/assistive device  Expression   Expression assist level: Expresses basic needs/ideas: With no assist  Social Interaction Social Interaction assist level: Interacts appropriately with others with medication or extra time (anti-anxiety, antidepressant).  Problem Solving Problem solving assist level: Solves basic problems with no assist  Memory Memory assist level: Recognizes or recalls 90% of the time/requires cueing < 10% of the time    Pain Pain Assessment Pain  Assessment: No/denies pain  Therapy/Group: Individual Therapy  Aurelius Gildersleeve 12/12/2015, 12:27 PM

## 2015-12-12 NOTE — Plan of Care (Signed)
Problem: RH Balance Goal: LTG Patient will maintain dynamic standing balance (PT) LTG: Patient will maintain dynamic standing balance with assistance during mobility activities (PT)  Outcome: Completed/Met Date Met:  12/12/15 With RW  Problem: RH Bed to Chair Transfers Goal: LTG Patient will perform bed/chair transfers w/assist (PT) LTG: Patient will perform bed/chair transfers with assistance, with/without cues (PT).  Outcome: Completed/Met Date Met:  12/12/15 With RW  Problem: RH Car Transfers Goal: LTG Patient will perform car transfers with assist (PT) LTG: Patient will perform car transfers with assistance (PT).  Outcome: Completed/Met Date Met:  12/12/15 With RW  Problem: RH Ambulation Goal: LTG Patient will ambulate in controlled environment (PT) LTG: Patient will ambulate in a controlled environment, # of feet with assistance (PT).  Outcome: Completed/Met Date Met:  12/12/15 150 ft with RW Goal: LTG Patient will ambulate in home environment (PT) LTG: Patient will ambulate in home environment, # of feet with assistance (PT).  Outcome: Completed/Met Date Met:  12/12/15 50 ft with RW  Problem: RH Stairs Goal: LTG Patient will ambulate up and down stairs w/assist (PT) LTG: Patient will ambulate up and down # of stairs with assistance (PT)  12 steps with B rails

## 2015-12-13 LAB — GLUCOSE, CAPILLARY: GLUCOSE-CAPILLARY: 116 mg/dL — AB (ref 65–99)

## 2015-12-13 MED ORDER — METFORMIN HCL 1000 MG PO TABS: 1000.0000 mg | ORAL_TABLET | Freq: Two times a day (BID) | ORAL | Status: AC

## 2015-12-13 MED ORDER — ASPIRIN 325 MG PO TABS: 325.0000 mg | ORAL_TABLET | Freq: Every day | ORAL | Status: DC

## 2015-12-13 MED ORDER — METOPROLOL TARTRATE 25 MG PO TABS: 12.5000 mg | ORAL_TABLET | Freq: Two times a day (BID) | ORAL | Status: DC

## 2015-12-13 MED ORDER — LINAGLIPTIN 5 MG PO TABS: 5.0000 mg | ORAL_TABLET | Freq: Every day | ORAL | Status: DC

## 2015-12-13 MED ORDER — SIMVASTATIN 40 MG PO TABS: 40.0000 mg | ORAL_TABLET | Freq: Every evening | ORAL | Status: DC

## 2015-12-13 MED ORDER — AMLODIPINE BESYLATE 10 MG PO TABS: 10.0000 mg | ORAL_TABLET | Freq: Every day | ORAL | Status: DC

## 2015-12-13 MED ORDER — LEVOTHYROXINE SODIUM 50 MCG PO TABS: 50.0000 ug | ORAL_TABLET | Freq: Every day | ORAL | Status: DC

## 2015-12-13 MED ORDER — CLOPIDOGREL BISULFATE 75 MG PO TABS: 75.0000 mg | ORAL_TABLET | Freq: Every day | ORAL | Status: DC

## 2015-12-13 MED ORDER — LISINOPRIL 10 MG PO TABS: 10.0000 mg | ORAL_TABLET | Freq: Every day | ORAL | Status: DC

## 2015-12-13 NOTE — Progress Notes (Signed)
Social Work  Discharge Note  The overall goal for the admission was met for:   Discharge location: Yes-HOME WITH DAUGHTER WHO WILL BE PROVIDING CARE AND OTHER CHILDREN WILL ALSO ASSIST  Length of Stay: Yes-12 DAYS  Discharge activity level: Yes-SUPERVISION LEVEL DUE TO BALANCE ISSUES  Home/community participation: Yes  Services provided included: MD, RD, PT, OT, SLP, RN, CM, TR, Pharmacy and SW  Financial Services: Medicare and Medicaid  Follow-up services arranged: Outpatient: [facility] 5/31 @ 11:00 WILL WORK IN PT APPOINTMENTS, DME: [facility] [place] and Patient/Family has no preference for HH/DME agencies  Comments (or additional information):DAUGHTER'S WERE IN FOR EDUCATION YESTERDAY AND ALL FEEL COMFORTABLE WITH HIS CARE FOR DISCHARGE HOME TODAY. PT TO GO TO OP THERAPIES AND FAMILY COMMITTED TO TRANSPORTING TO APPOINTMENTS  Patient/Family verbalized understanding of follow-up arrangements: Yes  Individual responsible for coordination of the follow-up plan: SELF & CHRISTY-DAUGHTER  Confirmed correct DME delivered: Elease Hashimoto 12/13/2015    Elease Hashimoto

## 2015-12-13 NOTE — Discharge Summary (Signed)
Discharge summary job [phone]

## 2015-12-13 NOTE — Discharge Summary (Signed)
NAMESAULO, PITTINGER NO.:  [id number]  MEDICAL RECORD NO.:  [id number]  LOCATION:  4M07C                        FACILITY:  [facility]  PHYSICIAN:  Charlett Blake, M.D.DATE OF BIRTH:  1950/09/21  DATE OF ADMISSION:  12/01/2015 DATE OF DISCHARGE:  12/13/2015                              DISCHARGE SUMMARY   DISCHARGE DIAGNOSES: 1. Left lateral medullary infarct. 2. Subcutaneous Lovenox for DVT prophylaxis. 3. Dysphagia. 4. Diabetes mellitus peripheral neuropathy. 5. Hypertension.  HISTORY OF PRESENT ILLNESS:  This is a 65 year old right-handed male, history of hypertension, diabetes mellitus, coronary artery disease with PCI, maintained on Plavix.  He lives in [place] with his daughter. Independent prior to admission.  Presented on Nov 25, 2015, with acute onset of left-sided weakness, facial droop, and slurred speech, as well as headache.  Cranial CT scan negative.  The patient did receive tPA. MRI of the brain showed acute infarction in the lateral medullary region on the left.  CT angiogram of the head and neck showed no large vessel occlusion or stenosis.  Echocardiogram with ejection fraction of [id number], grade 1 diastolic dysfunction.  Neurology consulted, placed on aspirin therapy as well as Plavix x3 months, then Plavix alone.  Subcutaneous Lovenox for DVT prophylaxis.  Maintained on a mechanical soft nectar thick liquid diet.  Bouts of hiccups, placed on baclofen.  Physical and occupational therapy ongoing.  The patient was admitted for comprehensive rehab program.  PAST MEDICAL HISTORY:  See discharge diagnoses.  SOCIAL HISTORY:  Lives with daughter, independent prior to admission. Retired.  Functional status upon admission to rehab services was moderate assist, 25 feet rolling walker, minimal assist sit to stand, min-to-mod assist for activities of daily living.  PHYSICAL EXAMINATION:  VITAL SIGNS:  Blood pressure 176/97, pulse 71, temperature  97, respirations 18. GENERAL:  This was an alert male, left facial droop.  He did initially have a nasogastric tube in place.  Made good eye contact with examiner. Followed simple commands. LUNGS:  Clear to auscultation without wheeze. CARDIAC:  Regular rate and rhythm.  No murmur. ABDOMEN:  Soft, nontender.  Good bowel sounds.  REHABILITATION HOSPITAL COURSE:  The patient was admitted to inpatient rehab services with therapies initiated on a 3-hour daily basis, consisting of physical therapy, occupational therapy, speech therapy, and rehabilitation nursing.  The following issues were addressed during the patient's rehabilitation stay.  Pertaining to Mr. Stoneberg left lateral medullary infarct remained stable, maintained on aspirin and Plavix therapy.  Subcutaneous Lovenox for DVT prophylaxis.  No bleeding episodes.  Diet was advanced to mechanical soft thin liquids by teaspoon.  No signs of aspiration.  Blood pressures controlled and monitored.  No orthostatic blood pressure changes.  Maintained on lisinopril as well as Norvasc and would follow up with primary MD. Diabetes mellitus, peripheral neuropathy, hemoglobin A1c of 6.1. Maintained on Tradjenta 5 mg daily as well as Glucophage 1000 mg twice daily.  Full diabetic teaching completed.  The patient received weekly collaborative interdisciplinary team conferences to discuss estimated length of stay, family teaching, any barriers to discharge.  The patient able to complete all transfers, sit to stand, stand pivot, rolling walker, car transfers with supervision.  Negotiated  stairs with supervision.  Ambulated 150 feet rolling walker, supervision.  The patient demonstrates increased fall risk as noted by score of 35/56 on Berg balance testing. The patient could gather belongings for activities of daily living and homemaking of bathing, dressing, grooming, and hygiene.  Speech Therapy followup for dysarthria as well as dysphagia. All  education regards to aspiration risk were discussed with patient's family.  Ongoing therapies dictated per Rehab services at time of discharge.  DISCHARGE MEDICATIONS:  Included: 1. Norvasc 10 mg p.o. daily. 2. Aspirin 325 mg p.o. daily. 3. Lipitor 20 mg p.o. daily. 4. Plavix 75 mg p.o. daily. 5. Synthroid 50 mcg p.o. daily. 6. Tradjenta 5 mg p.o. daily. 7. Lisinopril 10 mg p.o. daily. 8. Glucophage 1000 mg p.o. b.i.d. 9. Lopressor 12.5 mg p.o. b.i.d.  DIET:  Mechanical soft, thin liquids by teaspoon.  Diabetic restrictions.  FOLLOWUP:  The patient would follow up with Dr. Alysia Penna at the outpatient Rehab service office, to call for appointment; Dr. Erlinda Hong, Neurology services, Dr. [name], medical management.     Lauraine Rinne, P.A.   ______________________________ Charlett Blake, M.D.    DA/MEDQ  D:  12/13/2015  T:  12/13/2015  Job:  [id number]  cc:   [name], DO Rosalin Hawking, MD

## 2015-12-13 NOTE — Discharge Instructions (Signed)
Inpatient Rehab Discharge Instructions  [facility] Discharge date and time: No discharge date for patient encounter.   Activities/Precautions/ Functional Status: Activity: activity as tolerated Diet: Mechanical soft thin liquids by teaspoon Wound Care: none needed Functional status:  ___ No restrictions     ___ Walk up steps independently ___ 24/7 supervision/assistance   ___ Walk up steps with assistance ___ Intermittent supervision/assistance  ___ Bathe/dress independently ___ Walk with walker     _x__ Bathe/dress with assistance ___ Walk Independently    ___ Shower independently ___ Walk with assistance    ___ Shower with assistance ___ No alcohol     ___ Return to work/school ________  Special Instructions: No driving  Follow-up Dr. Lynford Citizen [address] [place], [place] phone number [phone]. Office to call for appointment   COMMUNITY REFERRALS UPON DISCHARGE:   Outpatient: PT & SP  [place] [id number]   Date of Last Service:12/13/2015  Appointment Date/Time:MAY 31-WEDNESDAY FOR SPEECH @ 11:00 AM WILL WORK IN FOR PT SESSIONS  Medical Equipment/Items Ordered:ROLLING WALKER, [place] BENCH  Agency/Supplier:[facility]   [phone]   GENERAL COMMUNITY RESOURCES FOR PATIENT/FAMILY: Support Groups:[facility] SUPPORT GROUP SECOND Thursday OF EACH MONTH @ 3:00-4:00 PM ON THE REHAB UNIT QUESTIONS CONTACT KATIE [phone]  STROKE/TIA DISCHARGE INSTRUCTIONS SMOKING Cigarette smoking nearly doubles your risk of having a stroke & is the single most alterable risk factor  If you smoke or have smoked in the last 12 months, you are advised to quit smoking for your health.  Most of the excess cardiovascular risk related to smoking disappears within a year of stopping.  Ask you doctor about anti-smoking medications  [place] Quit Line: 1-800-QUIT NOW  Free Smoking Cessation Classes [phone]  CHOLESTEROL Know  your levels; limit fat & cholesterol in your diet  Lipid Panel     Component Value Date/Time   CHOL 108 11/26/2015 0420   TRIG 21 11/26/2015 0420   HDL 45 11/26/2015 0420   CHOLHDL 2.4 11/26/2015 0420   VLDL 4 11/26/2015 0420   LDLCALC 59 11/26/2015 0420      Many patients benefit from treatment even if their cholesterol is at goal.  Goal: Total Cholesterol (CHOL) less than 160  Goal:  Triglycerides (TRIG) less than 150  Goal:  HDL greater than 40  Goal:  LDL (LDLCALC) less than 100   BLOOD PRESSURE American Stroke Association blood pressure target is less that 120/80 mm/Hg  Your discharge blood pressure is:  BP: 122/85 mmHg  Monitor your blood pressure  Limit your salt and alcohol intake  Many individuals will require more than one medication for high blood pressure  DIABETES (A1c is a blood sugar average for last 3 months) Goal HGBA1c is under 7% (HBGA1c is blood sugar average for last 3 months)  Diabetes:     Lab Results  Component Value Date   HGBA1C 6.1* 11/26/2015     Your HGBA1c can be lowered with medications, healthy diet, and exercise.  Check your blood sugar as directed by your physician  Call your physician if you experience unexplained or low blood sugars.  PHYSICAL ACTIVITY/REHABILITATION Goal is 30 minutes at least 4 days per week  Activity: Increase activity slowly, Therapies: Physical Therapy: Home Health Return to work:   Activity decreases your risk of heart attack and stroke and makes your heart stronger.  It helps control your weight and blood pressure; helps you relax and can improve your mood.  Participate  in a regular exercise program.  Talk with your doctor about the best form of exercise for you (dancing, walking, swimming, cycling).  DIET/WEIGHT Goal is to maintain a healthy weight  Your discharge diet is: DIET DYS 3 Room service appropriate?: Yes; Fluid consistency:: Thin  liquids Your height is:  Height: 5\' 8"  (172.7 cm) Your  current weight is: Weight: 103.477 kg (228 lb 2 oz) Your Body Mass Index (BMI) is:  BMI (Calculated): 35.5  Following the type of diet specifically designed for you will help prevent another stroke.  Your goal weight range is:    Your goal Body Mass Index (BMI) is 19-24.  Healthy food habits can help reduce 3 risk factors for stroke:  High cholesterol, hypertension, and excess weight.  RESOURCES Stroke/Support Group:  Call [phone]   STROKE EDUCATION PROVIDED/REVIEWED AND GIVEN TO PATIENT Stroke warning signs and symptoms How to activate emergency medical system (call 911). Medications prescribed at discharge. Need for follow-up after discharge. Personal risk factors for stroke. Pneumonia vaccine given:  Flu vaccine given:  My questions have been answered, the writing is legible, and I understand these instructions.  I will adhere to these goals & educational materials that have been provided to me after my discharge from the hospital.       My questions have been answered and I understand these instructions. I will adhere to these goals and the provided educational materials after my discharge from the hospital.  Patient/Caregiver Signature _______________________________ Date __________  Clinician Signature _______________________________________ Date __________  Please bring this form and your medication list with you to all your follow-up doctor's appointments.

## 2015-12-13 NOTE — Progress Notes (Signed)
Subjective/Complaints: No dizziness Discussed BP meds No CP or SOB   ROS- no , N/V/D, no limb or spine pain Objective: Vital Signs: Blood pressure 114/61, pulse 64, temperature 97.7 F (36.5 C), temperature source Oral, resp. rate 18, height '5\' 8"'  (1.727 m), weight 105.008 kg (231 lb 8 oz), SpO2 100 %. No results found. Results for orders placed or performed during the hospital encounter of 12/01/15 (from the past 72 hour(s))  Glucose, capillary     Status: Abnormal   Collection Time: 12/10/15 11:28 AM  Result Value Ref Range   Glucose-Capillary 206 (H) 65 - 99 mg/dL  Glucose, capillary     Status: Abnormal   Collection Time: 12/10/15  4:57 PM  Result Value Ref Range   Glucose-Capillary 109 (H) 65 - 99 mg/dL  Glucose, capillary     Status: Abnormal   Collection Time: 12/10/15  9:11 PM  Result Value Ref Range   Glucose-Capillary 157 (H) 65 - 99 mg/dL  Glucose, capillary     Status: Abnormal   Collection Time: 12/11/15  6:43 AM  Result Value Ref Range   Glucose-Capillary 164 (H) 65 - 99 mg/dL  Glucose, capillary     Status: Abnormal   Collection Time: 12/11/15 11:38 AM  Result Value Ref Range   Glucose-Capillary 117 (H) 65 - 99 mg/dL   Comment 1 Notify RN   Glucose, capillary     Status: Abnormal   Collection Time: 12/11/15  4:48 PM  Result Value Ref Range   Glucose-Capillary 159 (H) 65 - 99 mg/dL  Glucose, capillary     Status: Abnormal   Collection Time: 12/11/15  9:06 PM  Result Value Ref Range   Glucose-Capillary 114 (H) 65 - 99 mg/dL  Glucose, capillary     Status: Abnormal   Collection Time: 12/12/15  6:38 AM  Result Value Ref Range   Glucose-Capillary 174 (H) 65 - 99 mg/dL  Basic metabolic panel     Status: Abnormal   Collection Time: 12/12/15 10:11 AM  Result Value Ref Range   Sodium 138 135 - 145 mmol/L   Potassium 4.3 3.5 - 5.1 mmol/L   Chloride 108 101 - 111 mmol/L   CO2 24 22 - 32 mmol/L   Glucose, Bld 244 (H) 65 - 99 mg/dL   BUN 18 6 - 20 mg/dL   Creatinine, Ser 1.40 (H) 0.61 - 1.24 mg/dL   Calcium 9.3 8.9 - 10.3 mg/dL   GFR calc non Af Amer 51 (L) >60 mL/min   GFR calc Af Amer 59 (L) >60 mL/min    Comment: (NOTE) The eGFR has been calculated using the CKD EPI equation. This calculation has not been validated in all clinical situations. eGFR's persistently <60 mL/min signify possible Chronic Kidney Disease.    Anion gap 6 5 - 15  Glucose, capillary     Status: Abnormal   Collection Time: 12/12/15 12:06 PM  Result Value Ref Range   Glucose-Capillary 163 (H) 65 - 99 mg/dL  Glucose, capillary     Status: None   Collection Time: 12/12/15  4:54 PM  Result Value Ref Range   Glucose-Capillary 74 65 - 99 mg/dL  Glucose, capillary     Status: Abnormal   Collection Time: 12/12/15  8:51 PM  Result Value Ref Range   Glucose-Capillary 130 (H) 65 - 99 mg/dL  Glucose, capillary     Status: Abnormal   Collection Time: 12/13/15  6:48 AM  Result Value Ref Range   Glucose-Capillary 116 (  H) 65 - 99 mg/dL     HEENT: normal Cardio: Ireg Irreg and no murmur Resp: CTA B/L and unlabored GI: BS positive and NT, ND Extremity:  Pulses positive and No Edema Skin:   Intact Neuro: Alert/Oriented, Normal Sensory, Normal Motor, Abnormal FMC Ataxic/ dec FMC and Other protracted hiccups Musc/Skel:  Normal Gen NAD   Assessment/Plan: 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  Stable for D/C today F/u PCP in 3-4 weeks F/u PM&R 2 weeks transitional care See D/C summary See D/C instructions FIM: Function - Bathing Position: Shower Body parts bathed by patient: Right arm, Left arm, Chest, Abdomen, Front perineal area, Buttocks, Right upper leg, Left upper leg, Right lower leg, Left lower leg Bathing not applicable: Back Assist Level: More than reasonable time  Function- Upper Body Dressing/Undressing What is the patient wearing?: Pull over shirt/dress Pull over shirt/dress - Perfomed by patient: Thread/unthread right sleeve,  Thread/unthread left sleeve, Put head through opening, Pull shirt over trunk Assist Level: More than reasonable time Set up : To obtain clothing/put away Function - Lower Body Dressing/Undressing What is the patient wearing?: Shoes, [employer], Pants, Underwear Position: Wheelchair/chair at [employer] - Performed by patient: Thread/unthread right underwear leg, Thread/unthread left underwear leg, Pull underwear up/down Pants- Performed by patient: Thread/unthread right pants leg, Thread/unthread left pants leg, Pull pants up/down, Fasten/unfasten pants Non-skid slipper socks- Performed by patient: Don/doff right sock, Don/doff left sock Socks - Performed by patient: Don/doff left sock, Don/doff right sock Shoes - Performed by patient: Don/doff right shoe, Don/doff left shoe, Fasten right, Fasten left TED Hose - Performed by patient: Don/doff right TED hose, Don/doff left TED hose Assist for footwear: Supervision/touching assist Assist for lower body dressing: More than reasonable time  Function - Toileting Toileting activity did not occur: Refused Toileting steps completed by patient: Performs perineal hygiene, Adjust clothing after toileting, Adjust clothing prior to toileting Toileting steps completed by helper: Adjust clothing prior to toileting, Adjust clothing after toileting Toileting Assistive Devices: Grab bar or rail Assist level: Supervision or verbal cues  Function - Air cabin crew transfer activity did not occur: Refused Toilet transfer assistive device: Grab bar Assist level to toilet: Supervision or verbal cues Assist level from toilet: Supervision or verbal cues  Function - Chair/bed transfer Chair/bed transfer method: Ambulatory Chair/bed transfer assist level: Supervision or verbal cues Chair/bed transfer assistive device: Walker Chair/bed transfer details: Manual facilitation for weight shifting, Verbal cues for sequencing  Function - Locomotion:  Wheelchair Will patient use wheelchair at discharge?: No Type: Manual Wheelchair activity did not occur: N/A Max wheelchair distance: 200 Assist Level: Supervision or verbal cues Wheel 50 feet with 2 turns activity did not occur: N/A Assist Level: Supervision or verbal cues Wheel 150 feet activity did not occur: N/A Assist Level: Supervision or verbal cues Turns around,maneuvers to table,bed, and toilet,negotiates 3% grade,maneuvers on rugs and over doorsills: No Function - Locomotion: Ambulation Assistive device: Walker-rolling Max distance: 200 ft Assist level: Supervision or verbal cues Assist level: Supervision or verbal cues Walk 50 feet with 2 turns activity did not occur: Safety/medical concerns Assist level: Supervision or verbal cues Walk 150 feet activity did not occur: Safety/medical concerns Assist level: Supervision or verbal cues Walk 10 feet on uneven surfaces activity did not occur: Safety/medical concerns Assist level: Supervision or verbal cues  Function - Comprehension Comprehension: Auditory Comprehension assist level: Understands basic 90% of the time/cues < 10% of the time  Function - Expression Expression: Verbal Expression assist  level: Expresses basic 90% of the time/requires cueing < 10% of the time.  Function - Social Interaction Social Interaction assist level: Interacts appropriately 90% of the time - Needs monitoring or encouragement for participation or interaction.  Function - Problem Solving Problem solving assist level: Solves basic 90% of the time/requires cueing < 10% of the time  Function - Memory Memory assist level: Recognizes or recalls 25 - 49% of the time/requires cueing 50 - 75% of the time Patient normally able to recall (first 3 days only): Current season    Medical Problem List and Plan: 1.  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  DVT Prophylaxis/Anticoagulation:  Subcutaneous Lovenox. Monitor platelet counts and any signs of bleeding 3. Pain Management: Tylenol as needed 4. Dysphagia. Dysphagia 3 nectar liquids .Follow-up speech therapy. Now on thin liq via teaspoon, no signs of aspiration 5. Neuropsych: This patient is capable of making decisions on his own behalf. 6. Skin/Wound Care: Routine skin checks 7. Fluids/Electrolytes/Nutrition: Routine I&O's with follow-up chemistries Recheck BMET Pt on metformin and ACE I  8. Diabetes mellitus peripheral neuropathy. Hemoglobin A1c 6.1.Tradjenta 5 mg daily, Glucophage 1000 mg twice a day. Check blood sugars before meals and at bedtime. Diabetic teaching, controlled CBG (last 3)   Recent Labs  12/12/15 1654 12/12/15 2051 12/13/15 0648  GLUCAP 74 130* 116*    9. Hypertension. Norvasc 10 mg daily, , lisinopril 20 mg once a day. Monitor with increased mobility,BP still on low side, HR is creeping up,   need to resume low dose BB and , BP still running low will reduce Lisinopril to 48m  Filed Vitals:   12/12/15 1457 12/13/15 0500  BP: 122/85 114/61  Pulse: 83 64  Temp: 98.2 F (36.8 C) 97.7 F (36.5 C)  Resp: 18 18   10. CAD. Status post PCI. Continue aspirin and Plavix. No chest pain or shortness of breath, monitor with increased walking distance 11. Hypothyroidism. Continue Synthroid. 12. Hiccups. Increased baclofen 118mto QID on 5/22, hiccups better but c/o dizziness, will change to [id number] 13. Hyperlipidemia. Lipitor 14.  Dizziness- bradycardia plus baclofen, will also check orthostatics  LOS (Days) 12 A FACE TO FACE EVALUATION WAS PERFORMED  KIRSTEINS,ANDREW E 12/13/2015, 7:17 AM

## 2015-12-13 NOTE — Progress Notes (Signed)
Pt prepared for discharge today. Family at bedside for discharge instructions. MD- Jethro Bolus provided discharge teaching. Equipment delivered to bedside by [place]. Pt discharged to home with family.

## 2015-12-14 ENCOUNTER — Encounter (HOSPITAL_COMMUNITY): Payer: Self-pay | Admitting: Speech Pathology

## 2015-12-14 ENCOUNTER — Ambulatory Visit (HOSPITAL_COMMUNITY): Payer: Medicare Other | Attending: Physical Medicine & Rehabilitation | Admitting: Speech Pathology

## 2015-12-14 DIAGNOSIS — R490 Dysphonia: Secondary | ICD-10-CM | POA: Insufficient documentation

## 2015-12-14 DIAGNOSIS — R1312 Dysphagia, oropharyngeal phase: Secondary | ICD-10-CM | POA: Insufficient documentation
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
## 2015-12-15 ENCOUNTER — Telehealth: Payer: Self-pay

## 2015-12-15 MED ORDER — BACLOFEN 10 MG PO TABS: 10.0000 mg | ORAL_TABLET | Freq: Three times a day (TID) | ORAL | Status: DC | PRN

## 2015-12-15 NOTE — Telephone Encounter (Signed)
1. Are you/is patient experiencing any problems since coming home? Are there any questions regarding any aspect of care? No  2. Are there any questions regarding medications administration/dosing? Are meds being taken as prescribed? Patient should review meds with caller to confirm. Meds have been confirmed.  3. Have there been any falls? No 4. Has Home Health been to the house and/or have they contacted you? If not, have you tried to contact them? Can we help you contact them? Went to outpatient rehab.  5. Are bowels and bladder emptying properly? Are there any unexpected incontinence issues? If applicable, is patient following bowel/bladder programs? No issues 6. Any fevers, problems with breathing, unexpected pain? No 7. Are there any skin problems or new areas of breakdown? No 8. Has the patient/family member arranged specialty MD follow up (ie cardiology/neurology/renal/surgical/etc)?  Can we help arrange? Has made follow up appointments.  9. Does the patient need any other services or support that we can help arrange? No  10. Are caregivers following through as expected in assisting the patient? Yes, daughter 55. Has the patient quit smoking, drinking alcohol, or using drugs as recommended? Pt is not smoking, drinking alcohol or using drugs.   Spoke with pt's daughter. She is aware of the appointment on 01/02/16 @ 12:30 pm with [place].

## 2015-12-16 ENCOUNTER — Other Ambulatory Visit: Payer: Self-pay | Admitting: *Deleted

## 2015-12-16 MED ORDER — BACLOFEN 10 MG PO TABS: 10.0000 mg | ORAL_TABLET | Freq: Three times a day (TID) | ORAL | Status: DC | PRN

## 2015-12-20 ENCOUNTER — Ambulatory Visit (HOSPITAL_COMMUNITY): Payer: Medicare Other | Attending: Physical Medicine & Rehabilitation | Admitting: Physical Therapy

## 2015-12-20 ENCOUNTER — Encounter (HOSPITAL_COMMUNITY): Payer: Self-pay | Admitting: Physical Therapy

## 2015-12-20 ENCOUNTER — Encounter: Payer: Medicare Other | Admitting: Physical Medicine & Rehabilitation

## 2015-12-20 DIAGNOSIS — M6281 Muscle weakness (generalized): Secondary | ICD-10-CM | POA: Diagnosis present

## 2015-12-20 DIAGNOSIS — R2689 Other abnormalities of gait and mobility: Secondary | ICD-10-CM | POA: Insufficient documentation

## 2015-12-20 DIAGNOSIS — I6302 Cerebral infarction due to thrombosis of basilar artery: Secondary | ICD-10-CM | POA: Diagnosis present

## 2015-12-20 DIAGNOSIS — R1312 Dysphagia, oropharyngeal phase: Secondary | ICD-10-CM | POA: Insufficient documentation

## 2015-12-20 DIAGNOSIS — R490 Dysphonia: Secondary | ICD-10-CM | POA: Insufficient documentation

## 2015-12-20 DIAGNOSIS — R2681 Unsteadiness on feet: Secondary | ICD-10-CM | POA: Diagnosis present

## 2015-12-20 NOTE — Patient Instructions (Signed)
SIT TO STAND - NO HANDS  Start by sitting in a chair. Next, raise up to standing without using your hands for support. 2x10. Go SLOW when sitting back down.     BRIDGING  While lying on your back, tighten your lower abdominals, squeeze your buttocks and then raise your buttocks off the floor/bed as creating a "Bridge" with your body. Hold and then lower yourself and repeat.  2x15 reps.    WALKING  Start a walking program. Walk EVERY day for as long as you can and try to work up to 10-15 minutes total a day. Break it up throughout the day if needed.
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
## 2015-12-21 ENCOUNTER — Ambulatory Visit (HOSPITAL_COMMUNITY): Payer: Medicare Other | Admitting: Physical Therapy

## 2015-12-21 DIAGNOSIS — M6281 Muscle weakness (generalized): Secondary | ICD-10-CM | POA: Diagnosis not present

## 2015-12-21 DIAGNOSIS — R2689 Other abnormalities of gait and mobility: Secondary | ICD-10-CM

## 2015-12-21 DIAGNOSIS — R2681 Unsteadiness on feet: Secondary | ICD-10-CM
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
## 2015-12-22 ENCOUNTER — Ambulatory Visit (HOSPITAL_COMMUNITY): Payer: Medicare Other | Admitting: Speech Pathology

## 2015-12-22 DIAGNOSIS — M6281 Muscle weakness (generalized): Secondary | ICD-10-CM | POA: Diagnosis not present

## 2015-12-22 DIAGNOSIS — R490 Dysphonia: Secondary | ICD-10-CM

## 2015-12-22 DIAGNOSIS — R1312 Dysphagia, oropharyngeal phase: Secondary | ICD-10-CM
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
## 2015-12-27 ENCOUNTER — Ambulatory Visit (HOSPITAL_COMMUNITY): Payer: Medicare Other | Admitting: Speech Pathology

## 2015-12-27 DIAGNOSIS — R1312 Dysphagia, oropharyngeal phase: Secondary | ICD-10-CM

## 2015-12-27 DIAGNOSIS — R490 Dysphonia: Secondary | ICD-10-CM

## 2015-12-27 DIAGNOSIS — M6281 Muscle weakness (generalized): Secondary | ICD-10-CM | POA: Diagnosis not present
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
## 2015-12-29 ENCOUNTER — Ambulatory Visit (HOSPITAL_COMMUNITY): Payer: Medicare Other

## 2015-12-29 ENCOUNTER — Ambulatory Visit (HOSPITAL_COMMUNITY): Payer: Self-pay | Admitting: Physical Therapy

## 2015-12-29 DIAGNOSIS — R2689 Other abnormalities of gait and mobility: Secondary | ICD-10-CM

## 2015-12-29 DIAGNOSIS — R2681 Unsteadiness on feet: Secondary | ICD-10-CM

## 2015-12-29 DIAGNOSIS — M6281 Muscle weakness (generalized): Secondary | ICD-10-CM

## 2015-12-29 NOTE — Patient Instructions (Signed)
Abduction: Side Leg Lift (Eccentric) - Side-Lying    Lie on side. Lift top leg slightly higher than shoulder level. Keep top leg straight with body, toes pointing forward. Slowly lower for 3-5 seconds. 15 reps per set, 1-2 sets per day, 4 days per week.  http://ecce.exer.us/62   Copyright  VHI. All rights reserved.
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
## 2016-01-02 ENCOUNTER — Encounter: Payer: Self-pay | Admitting: Physical Medicine & Rehabilitation

## 2016-01-02 ENCOUNTER — Encounter: Payer: Medicare Other | Attending: Physical Medicine & Rehabilitation

## 2016-01-02 ENCOUNTER — Ambulatory Visit (HOSPITAL_BASED_OUTPATIENT_CLINIC_OR_DEPARTMENT_OTHER): Payer: Medicare Other | Admitting: Physical Medicine & Rehabilitation

## 2016-01-02 VITALS — BP 124/83 | HR 86 | Resp 17

## 2016-01-02 DIAGNOSIS — E1142 Type 2 diabetes mellitus with diabetic polyneuropathy: Secondary | ICD-10-CM | POA: Insufficient documentation

## 2016-01-02 DIAGNOSIS — G464 Cerebellar stroke syndrome: Secondary | ICD-10-CM | POA: Insufficient documentation

## 2016-01-02 DIAGNOSIS — E039 Hypothyroidism, unspecified: Secondary | ICD-10-CM | POA: Diagnosis not present

## 2016-01-02 DIAGNOSIS — E785 Hyperlipidemia, unspecified: Secondary | ICD-10-CM | POA: Diagnosis not present

## 2016-01-02 DIAGNOSIS — Z5189 Encounter for other specified aftercare: Secondary | ICD-10-CM | POA: Diagnosis not present

## 2016-01-02 DIAGNOSIS — I69322 Dysarthria following cerebral infarction: Secondary | ICD-10-CM | POA: Diagnosis not present

## 2016-01-02 DIAGNOSIS — G902 Horner's syndrome: Secondary | ICD-10-CM

## 2016-01-02 DIAGNOSIS — I69354 Hemiplegia and hemiparesis following cerebral infarction affecting left non-dominant side: Secondary | ICD-10-CM | POA: Diagnosis present

## 2016-01-02 DIAGNOSIS — I69391 Dysphagia following cerebral infarction: Secondary | ICD-10-CM | POA: Diagnosis not present

## 2016-01-02 DIAGNOSIS — R1310 Dysphagia, unspecified: Secondary | ICD-10-CM | POA: Diagnosis not present

## 2016-01-02 DIAGNOSIS — I2510 Atherosclerotic heart disease of native coronary artery without angina pectoris: Secondary | ICD-10-CM | POA: Insufficient documentation

## 2016-01-02 DIAGNOSIS — R52 Pain, unspecified: Secondary | ICD-10-CM | POA: Diagnosis not present

## 2016-01-02 DIAGNOSIS — G463 Brain stem stroke syndrome: Principal | ICD-10-CM
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
## 2016-01-03 ENCOUNTER — Telehealth (HOSPITAL_COMMUNITY): Payer: Self-pay | Admitting: Physical Therapy

## 2016-01-03 ENCOUNTER — Ambulatory Visit (HOSPITAL_COMMUNITY): Payer: Self-pay | Admitting: Physical Therapy

## 2016-01-03 NOTE — Telephone Encounter (Signed)
Pt is sick today per his daughter. NF 01/03/16

## 2016-01-04 ENCOUNTER — Ambulatory Visit (HOSPITAL_COMMUNITY): Payer: Medicare Other | Admitting: Speech Pathology

## 2016-01-04 DIAGNOSIS — R490 Dysphonia: Secondary | ICD-10-CM

## 2016-01-04 DIAGNOSIS — R1312 Dysphagia, oropharyngeal phase: Secondary | ICD-10-CM

## 2016-01-04 DIAGNOSIS — M6281 Muscle weakness (generalized): Secondary | ICD-10-CM | POA: Diagnosis not present
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
## 2016-01-05 ENCOUNTER — Ambulatory Visit (HOSPITAL_COMMUNITY): Payer: Medicare Other | Admitting: Physical Therapy

## 2016-01-05 DIAGNOSIS — R2689 Other abnormalities of gait and mobility: Secondary | ICD-10-CM

## 2016-01-05 DIAGNOSIS — R2681 Unsteadiness on feet: Secondary | ICD-10-CM

## 2016-01-05 DIAGNOSIS — M6281 Muscle weakness (generalized): Secondary | ICD-10-CM | POA: Diagnosis not present
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
## 2016-01-09 ENCOUNTER — Telehealth (HOSPITAL_COMMUNITY): Payer: Self-pay

## 2016-01-09 NOTE — Telephone Encounter (Signed)
01/09/16 he was scheduled for 7/3 and was double booked with therapist and so I cx appt and will add to the wait list since nothing available right now

## 2016-01-10 ENCOUNTER — Ambulatory Visit (HOSPITAL_COMMUNITY): Payer: Medicare Other | Admitting: Physical Therapy

## 2016-01-10 ENCOUNTER — Ambulatory Visit (HOSPITAL_COMMUNITY): Payer: Medicare Other | Admitting: Speech Pathology

## 2016-01-10 DIAGNOSIS — M6281 Muscle weakness (generalized): Secondary | ICD-10-CM

## 2016-01-10 DIAGNOSIS — R2689 Other abnormalities of gait and mobility: Secondary | ICD-10-CM

## 2016-01-10 DIAGNOSIS — R1312 Dysphagia, oropharyngeal phase: Secondary | ICD-10-CM

## 2016-01-10 DIAGNOSIS — R490 Dysphonia: Secondary | ICD-10-CM

## 2016-01-10 DIAGNOSIS — R2681 Unsteadiness on feet: Secondary | ICD-10-CM
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
## 2016-01-11 ENCOUNTER — Ambulatory Visit (HOSPITAL_COMMUNITY): Payer: Medicare Other

## 2016-01-11 ENCOUNTER — Ambulatory Visit (HOSPITAL_COMMUNITY): Payer: Medicare Other | Admitting: Speech Pathology

## 2016-01-11 DIAGNOSIS — I6302 Cerebral infarction due to thrombosis of basilar artery: Secondary | ICD-10-CM

## 2016-01-11 DIAGNOSIS — R490 Dysphonia: Secondary | ICD-10-CM

## 2016-01-11 DIAGNOSIS — R2681 Unsteadiness on feet: Secondary | ICD-10-CM

## 2016-01-11 DIAGNOSIS — R1312 Dysphagia, oropharyngeal phase: Secondary | ICD-10-CM

## 2016-01-11 DIAGNOSIS — M6281 Muscle weakness (generalized): Secondary | ICD-10-CM

## 2016-01-11 DIAGNOSIS — R2689 Other abnormalities of gait and mobility: Secondary | ICD-10-CM
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
## 2016-01-12 ENCOUNTER — Telehealth (HOSPITAL_COMMUNITY): Payer: Self-pay

## 2016-01-12 ENCOUNTER — Ambulatory Visit (HOSPITAL_COMMUNITY): Payer: Self-pay | Admitting: Physical Therapy

## 2016-01-12 NOTE — Telephone Encounter (Signed)
01/12/16 called because therapist said to cx - he was only supposed to come in 2 x week and this week he was scheduled for 3

## 2016-01-16 ENCOUNTER — Emergency Department (HOSPITAL_COMMUNITY)
Admission: EM | Admit: 2016-01-16 | Discharge: 2016-01-16 | Disposition: A | Discharge status: Home / Self Care | Payer: Medicare Other | Attending: Emergency Medicine | Admitting: Emergency Medicine

## 2016-01-16 ENCOUNTER — Ambulatory Visit (HOSPITAL_COMMUNITY): Payer: Self-pay | Admitting: Physical Therapy

## 2016-01-16 ENCOUNTER — Emergency Department (HOSPITAL_COMMUNITY): Payer: Medicare Other

## 2016-01-16 ENCOUNTER — Encounter (HOSPITAL_COMMUNITY): Payer: Self-pay | Admitting: *Deleted

## 2016-01-16 DIAGNOSIS — E039 Hypothyroidism, unspecified: Secondary | ICD-10-CM | POA: Insufficient documentation

## 2016-01-16 DIAGNOSIS — E119 Type 2 diabetes mellitus without complications: Secondary | ICD-10-CM | POA: Diagnosis not present

## 2016-01-16 DIAGNOSIS — Z8673 Personal history of transient ischemic attack (TIA), and cerebral infarction without residual deficits: Secondary | ICD-10-CM | POA: Insufficient documentation

## 2016-01-16 DIAGNOSIS — I2510 Atherosclerotic heart disease of native coronary artery without angina pectoris: Secondary | ICD-10-CM | POA: Insufficient documentation

## 2016-01-16 DIAGNOSIS — Z7982 Long term (current) use of aspirin: Secondary | ICD-10-CM | POA: Diagnosis not present

## 2016-01-16 DIAGNOSIS — F319 Bipolar disorder, unspecified: Secondary | ICD-10-CM | POA: Insufficient documentation

## 2016-01-16 DIAGNOSIS — E782 Mixed hyperlipidemia: Secondary | ICD-10-CM | POA: Insufficient documentation

## 2016-01-16 DIAGNOSIS — Z87891 Personal history of nicotine dependence: Secondary | ICD-10-CM | POA: Diagnosis not present

## 2016-01-16 DIAGNOSIS — R05 Cough: Secondary | ICD-10-CM | POA: Diagnosis present

## 2016-01-16 DIAGNOSIS — I10 Essential (primary) hypertension: Secondary | ICD-10-CM | POA: Insufficient documentation

## 2016-01-16 DIAGNOSIS — J45909 Unspecified asthma, uncomplicated: Secondary | ICD-10-CM | POA: Insufficient documentation

## 2016-01-16 DIAGNOSIS — Z7984 Long term (current) use of oral hypoglycemic drugs: Secondary | ICD-10-CM | POA: Insufficient documentation

## 2016-01-16 DIAGNOSIS — R0981 Nasal congestion: Secondary | ICD-10-CM | POA: Diagnosis not present

## 2016-01-16 DIAGNOSIS — J40 Bronchitis, not specified as acute or chronic: Secondary | ICD-10-CM

## 2016-01-16 DIAGNOSIS — M1990 Unspecified osteoarthritis, unspecified site: Secondary | ICD-10-CM | POA: Diagnosis not present

## 2016-01-16 MED ORDER — AZITHROMYCIN 250 MG PO TABS
500.0000 mg | ORAL_TABLET | Freq: Once | ORAL | Status: AC
  Administered 2016-01-16: 500 mg via ORAL
  Filled 2016-01-16: qty 2

## 2016-01-16 MED ORDER — ALBUTEROL SULFATE HFA 108 (90 BASE) MCG/ACT IN AERS
2.0000 | INHALATION_SPRAY | Freq: Once | RESPIRATORY_TRACT | Status: AC
  Administered 2016-01-16: 2 via RESPIRATORY_TRACT
  Filled 2016-01-16: qty 6.7

## 2016-01-16 MED ORDER — CEFTRIAXONE SODIUM 1 G IJ SOLR
1.0000 g | Freq: Once | INTRAMUSCULAR | Status: AC
  Administered 2016-01-16: 1 g via INTRAMUSCULAR
  Filled 2016-01-16: qty 10

## 2016-01-16 MED ORDER — HYDROCODONE-HOMATROPINE 5-1.5 MG/5ML PO SYRP: 5.0000 mL | ORAL_SOLUTION | Freq: Four times a day (QID) | ORAL | Status: DC | PRN

## 2016-01-16 MED ORDER — AZITHROMYCIN 250 MG PO TABS: ORAL_TABLET | ORAL | Status: DC

## 2016-01-16 NOTE — ED Notes (Signed)
Pt comes in for productive cough starting 3 days ago. Pt has nasal congestion as well.

## 2016-01-16 NOTE — Discharge Instructions (Signed)
Your x-ray shows bronchitis, but there is an area that is suspicious for early pneumonia. Please increase fluids. Wash hands frequently. Please use Zithromax daily until all taken, starting July 4. May use Hycodan every 6 hours for cough.This medication may cause drowsiness. Please do not drink, drive, or participate in activity that requires concentration while taking this medication. Use 2 puffs of albuterol every 4 hours. Please see her primary physician in 7-10 days for recheck of your lungs.
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
## 2016-01-18 ENCOUNTER — Telehealth (HOSPITAL_COMMUNITY): Payer: Self-pay

## 2016-01-18 NOTE — Telephone Encounter (Signed)
01/18/16 daughter called to cx - said he had pneumonia

## 2016-01-19 ENCOUNTER — Ambulatory Visit (HOSPITAL_COMMUNITY): Payer: Self-pay

## 2016-01-23 ENCOUNTER — Ambulatory Visit: Payer: Self-pay

## 2016-01-24 ENCOUNTER — Ambulatory Visit (HOSPITAL_COMMUNITY): Payer: Medicare Other | Attending: Physical Medicine & Rehabilitation | Admitting: Physical Therapy

## 2016-01-24 ENCOUNTER — Telehealth (HOSPITAL_COMMUNITY): Payer: Self-pay | Admitting: Physical Therapy

## 2016-01-24 DIAGNOSIS — R490 Dysphonia: Secondary | ICD-10-CM | POA: Insufficient documentation

## 2016-01-24 DIAGNOSIS — M6281 Muscle weakness (generalized): Secondary | ICD-10-CM | POA: Insufficient documentation

## 2016-01-24 DIAGNOSIS — R2689 Other abnormalities of gait and mobility: Secondary | ICD-10-CM | POA: Insufficient documentation

## 2016-01-24 DIAGNOSIS — R1312 Dysphagia, oropharyngeal phase: Secondary | ICD-10-CM | POA: Insufficient documentation

## 2016-01-24 DIAGNOSIS — R2681 Unsteadiness on feet: Secondary | ICD-10-CM | POA: Insufficient documentation

## 2016-01-24 NOTE — Telephone Encounter (Signed)
LMOM reminding pt of missed apt today at 1:45pm. Reminded of next apt on 01/26/16 @1 :45pm and provided # to call if they will not be able to make it.   2:16 PM,01/24/2016 Elly Modena PT, DPT [facility] [phone]

## 2016-01-26 ENCOUNTER — Ambulatory Visit (HOSPITAL_COMMUNITY): Payer: Medicare Other | Admitting: Physical Therapy

## 2016-01-26 DIAGNOSIS — M6281 Muscle weakness (generalized): Secondary | ICD-10-CM | POA: Diagnosis not present

## 2016-01-26 DIAGNOSIS — R2689 Other abnormalities of gait and mobility: Secondary | ICD-10-CM

## 2016-01-26 DIAGNOSIS — R490 Dysphonia: Secondary | ICD-10-CM | POA: Diagnosis present

## 2016-01-26 DIAGNOSIS — R2681 Unsteadiness on feet: Secondary | ICD-10-CM

## 2016-01-26 DIAGNOSIS — R1312 Dysphagia, oropharyngeal phase: Secondary | ICD-10-CM | POA: Diagnosis present
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
## 2016-01-30 ENCOUNTER — Ambulatory Visit (HOSPITAL_COMMUNITY): Payer: Medicare Other | Admitting: Speech Pathology

## 2016-01-30 DIAGNOSIS — R1312 Dysphagia, oropharyngeal phase: Secondary | ICD-10-CM

## 2016-01-30 DIAGNOSIS — M6281 Muscle weakness (generalized): Secondary | ICD-10-CM | POA: Diagnosis not present

## 2016-01-30 DIAGNOSIS — R490 Dysphonia: Secondary | ICD-10-CM
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
## 2016-02-02 ENCOUNTER — Encounter: Payer: Self-pay | Admitting: Internal Medicine

## 2016-02-02 ENCOUNTER — Encounter: Payer: Self-pay | Admitting: Physical Medicine & Rehabilitation

## 2016-02-02 ENCOUNTER — Ambulatory Visit (HOSPITAL_BASED_OUTPATIENT_CLINIC_OR_DEPARTMENT_OTHER): Payer: Medicare Other | Admitting: Physical Medicine & Rehabilitation

## 2016-02-02 ENCOUNTER — Encounter: Payer: Medicare Other | Attending: Physical Medicine & Rehabilitation

## 2016-02-02 ENCOUNTER — Ambulatory Visit (INDEPENDENT_AMBULATORY_CARE_PROVIDER_SITE_OTHER): Payer: Medicare Other | Admitting: Internal Medicine

## 2016-02-02 VITALS — BP 147/97 | HR 75 | Resp 17

## 2016-02-02 VITALS — BP 153/79 | HR 88 | Temp 98.8°F | Ht 68.0 in | Wt 247.7 lb

## 2016-02-02 DIAGNOSIS — Z8673 Personal history of transient ischemic attack (TIA), and cerebral infarction without residual deficits: Secondary | ICD-10-CM

## 2016-02-02 DIAGNOSIS — I2510 Atherosclerotic heart disease of native coronary artery without angina pectoris: Secondary | ICD-10-CM

## 2016-02-02 DIAGNOSIS — I69393 Ataxia following cerebral infarction: Secondary | ICD-10-CM

## 2016-02-02 DIAGNOSIS — E1100 Type 2 diabetes mellitus with hyperosmolarity without nonketotic hyperglycemic-hyperosmolar coma (NKHHC): Secondary | ICD-10-CM

## 2016-02-02 DIAGNOSIS — G464 Cerebellar stroke syndrome: Secondary | ICD-10-CM | POA: Diagnosis present

## 2016-02-02 DIAGNOSIS — E785 Hyperlipidemia, unspecified: Secondary | ICD-10-CM | POA: Insufficient documentation

## 2016-02-02 DIAGNOSIS — Z5189 Encounter for other specified aftercare: Secondary | ICD-10-CM | POA: Diagnosis not present

## 2016-02-02 DIAGNOSIS — R7989 Other specified abnormal findings of blood chemistry: Secondary | ICD-10-CM

## 2016-02-02 DIAGNOSIS — I69391 Dysphagia following cerebral infarction: Secondary | ICD-10-CM | POA: Insufficient documentation

## 2016-02-02 DIAGNOSIS — E1142 Type 2 diabetes mellitus with diabetic polyneuropathy: Secondary | ICD-10-CM | POA: Insufficient documentation

## 2016-02-02 DIAGNOSIS — R1310 Dysphagia, unspecified: Secondary | ICD-10-CM | POA: Insufficient documentation

## 2016-02-02 DIAGNOSIS — E039 Hypothyroidism, unspecified: Secondary | ICD-10-CM | POA: Diagnosis not present

## 2016-02-02 DIAGNOSIS — R946 Abnormal results of thyroid function studies: Secondary | ICD-10-CM

## 2016-02-02 DIAGNOSIS — Z794 Long term (current) use of insulin: Secondary | ICD-10-CM | POA: Diagnosis not present

## 2016-02-02 DIAGNOSIS — I69322 Dysarthria following cerebral infarction: Secondary | ICD-10-CM | POA: Insufficient documentation

## 2016-02-02 DIAGNOSIS — I10 Essential (primary) hypertension: Secondary | ICD-10-CM

## 2016-02-02 DIAGNOSIS — I69354 Hemiplegia and hemiparesis following cerebral infarction affecting left non-dominant side: Secondary | ICD-10-CM | POA: Diagnosis present

## 2016-02-02 DIAGNOSIS — Z9861 Coronary angioplasty status: Secondary | ICD-10-CM

## 2016-02-02 DIAGNOSIS — Z86010 Personal history of colonic polyps: Secondary | ICD-10-CM

## 2016-02-02 DIAGNOSIS — G902 Horner's syndrome: Secondary | ICD-10-CM | POA: Diagnosis present

## 2016-02-02 DIAGNOSIS — R52 Pain, unspecified: Secondary | ICD-10-CM | POA: Diagnosis not present

## 2016-02-02 LAB — GLUCOSE, CAPILLARY: GLUCOSE-CAPILLARY: 383 mg/dL — AB (ref 65–99)

## 2016-02-02 MED ORDER — AMLODIPINE BESYLATE 10 MG PO TABS: 10.0000 mg | ORAL_TABLET | Freq: Every day | ORAL | Status: DC

## 2016-02-02 MED ORDER — ONETOUCH ULTRA MINI W/DEVICE KIT: PACK | Status: AC

## 2016-02-02 MED ORDER — GLUCOSE BLOOD VI STRP: ORAL_STRIP | Status: DC

## 2016-02-02 MED ORDER — LISINOPRIL 20 MG PO TABS: 20.0000 mg | ORAL_TABLET | Freq: Every day | ORAL | Status: DC

## 2016-02-02 MED ORDER — ONETOUCH DELICA LANCETS 33G MISC: Status: AC

## 2016-02-02 MED ORDER — INSULIN GLARGINE 100 UNIT/ML SOLOSTAR PEN: 30.0000 [IU] | PEN_INJECTOR | Freq: Every day | SUBCUTANEOUS | Status: DC

## 2016-02-02 MED ORDER — METOPROLOL TARTRATE 25 MG PO TABS: 12.5000 mg | ORAL_TABLET | Freq: Two times a day (BID) | ORAL | Status: DC

## 2016-02-02 MED ORDER — INSULIN PEN NEEDLE 31G X 5 MM MISC: Status: AC

## 2016-02-02 NOTE — Assessment & Plan Note (Signed)
A: Remote history of PCI approximately 20 years ago.  Low risk stress test in 2016.  On BB, statin, and Ace inhibitor.  Currently on DAPT after stroke in May 2017.  P: - continue current management
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
## 2016-02-02 NOTE — Assessment & Plan Note (Signed)
BP Readings from Last 3 Encounters:  02/02/16 153/79  02/02/16 147/97  01/16/16 141/88   A:  He is hypertensive today over [id number] systolic.  His daughter with him in the room says they check his BP regularly and it is usually over [id number] systolic and around 90 diastolic.  He is adherent to metoprolol 12.5mg  BID, amlodipine 10mg  daily, and lisinopril 10mg  daily.  P: - given his recent CVA, DM, and underlying heart disease I feel that we should attempt to obtain better BP control - I have continued his metoprolol and amlodipine at current dose and I have increased his lisinopril to 20mg  daily. - Asked him and his daughter to check his BP 3-4 times per week and keep a log. - I recommended low-salt diet and exercise to attempt better BP control.  I think a 5-10 pound weight loss would be beneficial. - RTC in 3 weeks.  Will repeat a BMET to ensure stable kidney function and potassium.

## 2016-02-02 NOTE — Patient Instructions (Signed)

## 2016-02-02 NOTE — Patient Instructions (Signed)
Thank you for coming to see me today. It was a pleasure. Today we talked about:   Blood Pressure: - I have refilled your metoprolol, amlodipine, and lisinopril.   - I have increased your lisinopril to 20mg  daily.  Diabetes: - I have restarted your Lantus at a lower dose.  Please start taking 30 units at bedtime.  I have ordered the Pen Needles for you. - I have reordered all of your diabetic testing supplies. - Please call our clinic if you have any issue with obtaining your meter, test strips, or pen needles.  Please follow-up with me in 3 weeks.  If you have any questions or concerns, please do not hesitate to call the office at [phone].  Take Care,   [name], DO

## 2016-02-02 NOTE — Assessment & Plan Note (Signed)
A: Admitted May 2017 and MRI showed an acute infarct in the lateral medullary region on the left.  He was discharged to CIR and then to home with outpatient therapy services.  Following up with PM&R and neurology as an outpatient.  He remains on aspirin and Plavix for 3 months, then Plavix alone per neurology notes.  He is due to follow up with Neurology tomorrow.  P: - continue DAPT for about another month, then Plavix alone pending further recommendations by neurology. - continue statin - continue DM, HTN management.

## 2016-02-02 NOTE — Assessment & Plan Note (Signed)
Lab Results  Component Value Date   HGBA1C 6.1* 11/26/2015   A:  He was taken off Lantus after last hospitalization.  Prior to that he was on 67 units QHS.  He reports that his blood sugars at home have been averaging in the 300s.  He checks this once in the morning.  Today in clinic, his CBG was 383.  He continues to take Tradjenta 5mg  daily and metformin 1000mg  BID.  P: - continue metformin and Tradjenta - I have restarted his Lantus at a lower dose of 30units QHS.  I have ordered the pens which patient states he is familiar with and may be more easy to use post-stroke.  I have ordered him a new meter and test strips.  I have asked him to call us if he has issue acquiring any of these supplies. - I asked him to check his BS once daily in the morning. - we discussed avoiding sweets, sugary drinks, high carb foods, etc.  He reports doing a better job of this since leaving the hospital. - recommended daily exercise. - RTC 3 weeks with meter and to check A1c.
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
## 2016-02-02 NOTE — Assessment & Plan Note (Signed)
A/P: Recent colonoscopy on 11/14/15 negative.  Repeat in 3 years.

## 2016-02-02 NOTE — Assessment & Plan Note (Addendum)
A:  History of hypothyroidism.  Previously on Cytomel and Synthroid.  He is currently only taking Synthroid 52mcg daily.  His last TSH was 15.2 in January 2017.  No current symptoms of hypothyroidism.  P: - check TSH today. - continue Synthroid.  ADDENDUM 02/03/2016 9:10AM: - his TSH level was elevated at 10.6 - called patient to discuss the results and left VM - will increase in Synthroid to 22mcg in the morning before breakfast on empty stomach - RTC in 6-8 weeks for repeat TSH level

## 2016-02-03 ENCOUNTER — Encounter: Payer: Self-pay | Admitting: Internal Medicine

## 2016-02-03 ENCOUNTER — Ambulatory Visit: Payer: Self-pay | Admitting: Nurse Practitioner

## 2016-02-03 LAB — BMP8+ANION GAP
ANION GAP: 18 mmol/L (ref 10.0–18.0)
BUN / CREAT RATIO: 13 (ref 10–24)
BUN: 16 mg/dL (ref 8–27)
CHLORIDE: 99 mmol/L (ref 96–106)
CO2: 20 mmol/L (ref 18–29)
CREATININE: 1.23 mg/dL (ref 0.76–1.27)
Calcium: 8.9 mg/dL (ref 8.6–10.2)
GFR calc Af Amer: 71 mL/min/{1.73_m2} (ref >59)
GFR calc non Af Amer: 61 mL/min/{1.73_m2} (ref >59)
GLUCOSE: 389 mg/dL — AB (ref 65–99)
POTASSIUM: 4.4 mmol/L (ref 3.5–5.2)
SODIUM: 137 mmol/L (ref 134–144)

## 2016-02-03 LAB — TSH: TSH: 10.6 u[IU]/mL — AB (ref 0.450–4.500)

## 2016-02-03 MED ORDER — LEVOTHYROXINE SODIUM 75 MCG PO TABS
75.0000 ug | ORAL_TABLET | Freq: Every day | ORAL | Status: DC
Start: 2016-02-03 — End: 2016-05-02

## 2016-02-03 NOTE — Progress Notes (Signed)
Internal Medicine Clinic Attending  Case discussed with Dr. Wallace at the time of the visit.  We reviewed the resident's history and exam and pertinent patient test results.  I agree with the assessment, diagnosis, and plan of care documented in the resident's note.  

## 2016-02-03 NOTE — Addendum Note (Signed)
Addended by: Mignon Pine on: 02/03/2016 09:12 AM   Modules accepted: Orders

## 2016-02-06 ENCOUNTER — Encounter: Payer: Self-pay | Admitting: Nurse Practitioner

## 2016-02-08 ENCOUNTER — Ambulatory Visit (HOSPITAL_COMMUNITY): Payer: Medicare Other | Admitting: Speech Pathology

## 2016-02-08 DIAGNOSIS — R490 Dysphonia: Secondary | ICD-10-CM

## 2016-02-08 DIAGNOSIS — R1312 Dysphagia, oropharyngeal phase: Secondary | ICD-10-CM

## 2016-02-08 DIAGNOSIS — M6281 Muscle weakness (generalized): Secondary | ICD-10-CM | POA: Diagnosis not present
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
## 2016-02-09 ENCOUNTER — Ambulatory Visit (HOSPITAL_COMMUNITY): Payer: Medicare Other | Admitting: Physical Therapy

## 2016-02-09 DIAGNOSIS — M6281 Muscle weakness (generalized): Secondary | ICD-10-CM

## 2016-02-09 DIAGNOSIS — R2681 Unsteadiness on feet: Secondary | ICD-10-CM

## 2016-02-09 DIAGNOSIS — R2689 Other abnormalities of gait and mobility: Secondary | ICD-10-CM
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
## 2016-02-10 ENCOUNTER — Ambulatory Visit (HOSPITAL_COMMUNITY): Payer: Medicare Other

## 2016-02-10 ENCOUNTER — Other Ambulatory Visit: Payer: Self-pay | Admitting: Internal Medicine

## 2016-02-10 DIAGNOSIS — R490 Dysphonia: Secondary | ICD-10-CM

## 2016-02-13 ENCOUNTER — Ambulatory Visit (HOSPITAL_COMMUNITY): Payer: Self-pay | Admitting: Physical Therapy

## 2016-02-15 ENCOUNTER — Other Ambulatory Visit: Payer: Self-pay

## 2016-02-23 ENCOUNTER — Encounter: Payer: Self-pay | Admitting: Internal Medicine

## 2016-02-23 ENCOUNTER — Encounter (INDEPENDENT_AMBULATORY_CARE_PROVIDER_SITE_OTHER): Payer: Self-pay

## 2016-02-23 ENCOUNTER — Ambulatory Visit (INDEPENDENT_AMBULATORY_CARE_PROVIDER_SITE_OTHER): Payer: Medicare Other | Admitting: Internal Medicine

## 2016-02-23 VITALS — BP 144/89 | HR 90 | Temp 99.0°F | Ht 68.0 in | Wt 252.7 lb

## 2016-02-23 DIAGNOSIS — R490 Dysphonia: Secondary | ICD-10-CM | POA: Diagnosis not present

## 2016-02-23 DIAGNOSIS — I69328 Other speech and language deficits following cerebral infarction: Secondary | ICD-10-CM | POA: Diagnosis not present

## 2016-02-23 DIAGNOSIS — Z794 Long term (current) use of insulin: Secondary | ICD-10-CM

## 2016-02-23 DIAGNOSIS — E119 Type 2 diabetes mellitus without complications: Secondary | ICD-10-CM | POA: Diagnosis not present

## 2016-02-23 DIAGNOSIS — E1100 Type 2 diabetes mellitus with hyperosmolarity without nonketotic hyperglycemic-hyperosmolar coma (NKHHC): Secondary | ICD-10-CM

## 2016-02-23 DIAGNOSIS — Z8673 Personal history of transient ischemic attack (TIA), and cerebral infarction without residual deficits: Secondary | ICD-10-CM

## 2016-02-23 DIAGNOSIS — Z86010 Personal history of colonic polyps: Secondary | ICD-10-CM

## 2016-02-23 DIAGNOSIS — Z87891 Personal history of nicotine dependence: Secondary | ICD-10-CM

## 2016-02-23 DIAGNOSIS — Z79899 Other long term (current) drug therapy: Secondary | ICD-10-CM

## 2016-02-23 DIAGNOSIS — I10 Essential (primary) hypertension: Secondary | ICD-10-CM

## 2016-02-23 LAB — POCT GLYCOSYLATED HEMOGLOBIN (HGB A1C): Hemoglobin A1C: 11.9

## 2016-02-23 LAB — GLUCOSE, CAPILLARY: Glucose-Capillary: 352 mg/dL — ABNORMAL HIGH (ref 65–99)

## 2016-02-23 MED ORDER — LISINOPRIL 40 MG PO TABS: 40.0000 mg | ORAL_TABLET | Freq: Every day | ORAL | 2 refills | Status: AC
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
## 2016-02-23 NOTE — Assessment & Plan Note (Signed)
Lab Results  Component Value Date   HGBA1C 11.9 02/23/2016   A:  After being taken off Lantus upon discharge from CIR, his glucose at home was very high.  Last month, I restarted his Lantus at 30units QHS.  His average glucose is still high at 267 with the lowest being in the high 100s.  His A1c today is very elevated at close to 12.  P: - continue his metformin and Tradjenta - continue Lantus.  I instructed him to increase his dose by 2 units every 3 days until his morning blood sugars were <130.  He expressed understanding of this plan. - RTC in 4 weeks with meter.

## 2016-02-23 NOTE — Assessment & Plan Note (Signed)
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 He had FEES in hospital which showed decreased movement of left vocal fold. SLP feels that they have done all that we can do in SLP therapy without having input from ENT. Pt expresses a desire to improve voice. Would like to refer to ENT for laryngoscopy.  SLP says they will pick him back up if ENT recommends voice therapy.  P: - referral placed to ENT, he has upcoming appointment this month.

## 2016-02-23 NOTE — Assessment & Plan Note (Addendum)
BP Readings from Last 3 Encounters:  02/23/16 (!) 144/89  02/02/16 (!) 153/79  02/02/16 (!) 147/97   A: His BP today is better than last month but still high considering his recent CVA.  States he checks his BP at home and it is usually higher than today.  He thinks maybe even as high as [id number] systolic.  P: - continue metoprolol 12.5mg  BID, amlodipine 10mg  daily. - increased his lisinopril from 20mg  daily to 40mg  daily. - will have patient RTC in 4 weeks to recheck his BP and a BMET.  In the meantime, I have encouraged diet and exercise, particularly low salt diet.  I have also asked him to keep a home BP log.

## 2016-02-23 NOTE — Patient Instructions (Addendum)
Thank you for coming to see me today. It was a pleasure. Today we talked about:   Diabetes: - increase your Lantus by 2 units every 3 days until your morning blood sugar averages less than 150.  Then keep at that dose.  High Blood Pressure: - increase your Lisinopril to 40mg  daily - keep a blood pressure log - continue your other medications.  Stroke: - stop taking Plavix on August 20.  Continue taking Aspirin  Thyroid: - take synthroid 68mcg daily. - please return to the office for labwork only on September 14 to recheck your TSH.  Please follow-up with me in 4 weeks.  If you have any questions or concerns, please do not hesitate to call the office at [phone].  Take Care,   [name], DO

## 2016-02-23 NOTE — Assessment & Plan Note (Addendum)
A:  Admitted May 2017 and MRI showed an acute infarct in the lateral medullary region on the left.  He was discharged to CIR and then to home with outpatient therapy services.  Following up with PM&R and neurology as an outpatient.  He remains on aspirin and Plavix, then Plavix alone per neurology notes.  P: - continue DAPT through 8/20, then Plavix alone.  ADDENDUM 02/24/2016 8:41 AM  - in patient discharge AVS, I accidentally instructed him to stop Plavix and continue Aspirin when he should stop aspirin and continue Plavix starting 03/04/16 - I called patient and left a message with him and his daughter Alyse Low) with these instructions and asked that they call clinic with any questions.

## 2016-02-27 NOTE — Progress Notes (Signed)
Internal Medicine Clinic Attending  Case discussed with Dr. Wallace at the time of the visit.  We reviewed the resident's history and exam and pertinent patient test results.  I agree with the assessment, diagnosis, and plan of care documented in the resident's note.  

## 2016-03-09 ENCOUNTER — Other Ambulatory Visit: Payer: Self-pay

## 2016-03-29 ENCOUNTER — Ambulatory Visit (INDEPENDENT_AMBULATORY_CARE_PROVIDER_SITE_OTHER): Payer: Medicare Other | Admitting: Internal Medicine

## 2016-03-29 ENCOUNTER — Encounter: Payer: Self-pay | Admitting: Internal Medicine

## 2016-03-29 ENCOUNTER — Other Ambulatory Visit: Payer: Self-pay | Admitting: Otolaryngology

## 2016-03-29 VITALS — BP 153/87 | HR 90 | Temp 98.2°F | Ht 68.0 in | Wt 259.9 lb

## 2016-03-29 DIAGNOSIS — I10 Essential (primary) hypertension: Secondary | ICD-10-CM

## 2016-03-29 DIAGNOSIS — E1165 Type 2 diabetes mellitus with hyperglycemia: Secondary | ICD-10-CM

## 2016-03-29 DIAGNOSIS — M79671 Pain in right foot: Secondary | ICD-10-CM | POA: Diagnosis not present

## 2016-03-29 DIAGNOSIS — Z8673 Personal history of transient ischemic attack (TIA), and cerebral infarction without residual deficits: Secondary | ICD-10-CM

## 2016-03-29 DIAGNOSIS — Z794 Long term (current) use of insulin: Secondary | ICD-10-CM

## 2016-03-29 DIAGNOSIS — E1100 Type 2 diabetes mellitus with hyperosmolarity without nonketotic hyperglycemic-hyperosmolar coma (NKHHC): Secondary | ICD-10-CM

## 2016-03-29 DIAGNOSIS — R7989 Other specified abnormal findings of blood chemistry: Secondary | ICD-10-CM

## 2016-03-29 DIAGNOSIS — R946 Abnormal results of thyroid function studies: Secondary | ICD-10-CM | POA: Diagnosis not present

## 2016-03-29 DIAGNOSIS — Z79899 Other long term (current) drug therapy: Secondary | ICD-10-CM

## 2016-03-29 DIAGNOSIS — Z87891 Personal history of nicotine dependence: Secondary | ICD-10-CM

## 2016-03-29 LAB — GLUCOSE, CAPILLARY: Glucose-Capillary: 267 mg/dL — ABNORMAL HIGH (ref 65–99)

## 2016-03-29 MED ORDER — GABAPENTIN 300 MG PO CAPS: 300.0000 mg | ORAL_CAPSULE | Freq: Every day | ORAL | 0 refills | Status: AC

## 2016-03-29 MED ORDER — HYDROCHLOROTHIAZIDE 12.5 MG PO CAPS: 12.5000 mg | ORAL_CAPSULE | Freq: Every day | ORAL | 0 refills | Status: DC

## 2016-03-29 NOTE — Patient Instructions (Signed)
Thank you for coming to see me today. It was a pleasure. Today we talked about:   1) keep increasing your insulin by 2 units every 3 days until your morning blood sugar is averaging less than 150  2) I have ordered an eye exam for you to be seen by an eye doctor  3) we have started a new blood pressure medication called HCTZ.  Take this daily  4) I will let you know the results of your thyroid test  5) I have started a medication called gabapentin for the shooting pain in your feet.  Please follow-up with [place] in 2 weeks  If you have any questions or concerns, please do not hesitate to call the office at [phone].  Take Care,   [name], DO
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
## 2016-03-30 DIAGNOSIS — M79671 Pain in right foot: Secondary | ICD-10-CM | POA: Insufficient documentation

## 2016-03-30 LAB — TSH: TSH: 4.32 u[IU]/mL (ref 0.450–4.500)

## 2016-03-30 NOTE — Assessment & Plan Note (Signed)
BP Readings from Last 3 Encounters:  03/29/16 (!) 153/87  02/23/16 (!) 144/89  02/02/16 (!) 153/79   A: His BP today is elevated again above goal.  He is currently on amlodipine 10mg  daily, metoprolol 12.5mg  BID, and lisinopril 40mg  daily.  Body mass index is 39.52 kg/m. He reports eating a high salt diet and drinking caffeine.  He does not frequently use NSAIDs.  P: - we will continue his amlodipine, lisinopril, and metoprolol. - we will add HCTZ 12.5 mg daily with ability to titrate up if needed. - he will follow up in 2 weeks.

## 2016-03-30 NOTE — Assessment & Plan Note (Signed)
Lab Results  Component Value Date   HGBA1C 11.9 02/23/2016   A:  He has titrated his Lantus up to 38 units QHS currently while also remaining on Tradjenta and metformin.  His meter today shows an average CBG of 201 with the past week ranging from 283-311.  He is currently only checking CBG fasting in the morning.  If prandial insulin is needed, we will increase CBG monitoring to include post-prandial.  P: - continue his metformin and Tradjenta - continue Lantus.  I instructed him to continue with increase his dose by 2 units every 3 days until his morning blood sugars were <130-150.  He expressed understanding of this plan. - RTC in 2 weeks with meter.

## 2016-03-30 NOTE — Assessment & Plan Note (Signed)
A: Patient with 3-4 weeks of pain in his right foot with no preceding traumatic event or injury.  He describes a sharp, shooting pain and occasional tingling in his foot.  His foot is warm and well perfused with palpable pulses.  Sensation is grossly intact and there are no dermatomal deficits with mild pedal edema.  Seems consistent with possible neuropathic pain versus sequelae from his recent CVA.  P: - we will initiate low dose Gabapentin 300mg  QHS.  If symptoms partially resolve, can increase to BID or TID as needed.

## 2016-03-30 NOTE — Assessment & Plan Note (Signed)
A: TSH level was 10.6 in July and we increased his Synthroid to 56mcg in the morning before breakfast on an empty stomach.  P:  - repeat TSH level within normal range. - continue current dose of Synthroid. - patient called and informed of results.

## 2016-04-02 ENCOUNTER — Telehealth: Payer: Self-pay | Admitting: *Deleted

## 2016-04-02 NOTE — Telephone Encounter (Signed)
Received call from [facility] requesting permission to hold pt's plavix 5 days prior to procedure.  Pt will be having a throat procedure  (medializaton laryngoplasty) on 04/11/16.  Will forward info to pcp for review, please advise.Regenia Skeeter, Alisandra Son Cassady9/18/201711:07 AM

## 2016-04-03 NOTE — Progress Notes (Signed)
Case discussed with Dr. Wallace at the time of the visit.  We reviewed the resident's history and exam and pertinent patient test results.  I agree with the assessment, diagnosis and plan of care documented in the resident's note. 

## 2016-04-04 ENCOUNTER — Encounter (HOSPITAL_COMMUNITY)
Admission: RE | Admit: 2016-04-04 | Discharge: 2016-04-04 | Disposition: A | Discharge status: Home / Self Care | Payer: Medicare Other | Source: Ambulatory Visit | Attending: Otolaryngology | Admitting: Otolaryngology

## 2016-04-04 ENCOUNTER — Encounter (HOSPITAL_COMMUNITY): Payer: Self-pay

## 2016-04-04 DIAGNOSIS — R490 Dysphonia: Secondary | ICD-10-CM | POA: Diagnosis not present

## 2016-04-04 DIAGNOSIS — I69391 Dysphagia following cerebral infarction: Secondary | ICD-10-CM | POA: Diagnosis not present

## 2016-04-04 DIAGNOSIS — Z85038 Personal history of other malignant neoplasm of large intestine: Secondary | ICD-10-CM | POA: Insufficient documentation

## 2016-04-04 DIAGNOSIS — E785 Hyperlipidemia, unspecified: Secondary | ICD-10-CM | POA: Diagnosis not present

## 2016-04-04 DIAGNOSIS — I252 Old myocardial infarction: Secondary | ICD-10-CM | POA: Diagnosis not present

## 2016-04-04 DIAGNOSIS — E119 Type 2 diabetes mellitus without complications: Secondary | ICD-10-CM | POA: Diagnosis not present

## 2016-04-04 DIAGNOSIS — I69398 Other sequelae of cerebral infarction: Secondary | ICD-10-CM | POA: Insufficient documentation

## 2016-04-04 DIAGNOSIS — Z794 Long term (current) use of insulin: Secondary | ICD-10-CM | POA: Insufficient documentation

## 2016-04-04 DIAGNOSIS — R1310 Dysphagia, unspecified: Secondary | ICD-10-CM | POA: Insufficient documentation

## 2016-04-04 DIAGNOSIS — Z9049 Acquired absence of other specified parts of digestive tract: Secondary | ICD-10-CM | POA: Diagnosis not present

## 2016-04-04 DIAGNOSIS — Z01812 Encounter for preprocedural laboratory examination: Secondary | ICD-10-CM | POA: Insufficient documentation

## 2016-04-04 DIAGNOSIS — I2510 Atherosclerotic heart disease of native coronary artery without angina pectoris: Secondary | ICD-10-CM | POA: Diagnosis not present

## 2016-04-04 DIAGNOSIS — G4733 Obstructive sleep apnea (adult) (pediatric): Secondary | ICD-10-CM | POA: Insufficient documentation

## 2016-04-04 DIAGNOSIS — Z79899 Other long term (current) drug therapy: Secondary | ICD-10-CM | POA: Insufficient documentation

## 2016-04-04 DIAGNOSIS — Z01818 Encounter for other preprocedural examination: Secondary | ICD-10-CM | POA: Diagnosis not present

## 2016-04-04 DIAGNOSIS — Z7902 Long term (current) use of antithrombotics/antiplatelets: Secondary | ICD-10-CM | POA: Diagnosis not present

## 2016-04-04 DIAGNOSIS — Z87891 Personal history of nicotine dependence: Secondary | ICD-10-CM | POA: Insufficient documentation

## 2016-04-04 DIAGNOSIS — E039 Hypothyroidism, unspecified: Secondary | ICD-10-CM | POA: Diagnosis not present

## 2016-04-04 DIAGNOSIS — I10 Essential (primary) hypertension: Secondary | ICD-10-CM | POA: Diagnosis not present

## 2016-04-04 HISTORY — DX: Reserved for inherently not codable concepts without codable children: IMO0001

## 2016-04-04 HISTORY — DX: Acute myocardial infarction, unspecified: I21.9

## 2016-04-04 LAB — BASIC METABOLIC PANEL
Anion gap: 8 (ref 5–15)
BUN: 13 mg/dL (ref 6–20)
CALCIUM: 9.4 mg/dL (ref 8.9–10.3)
CO2: 22 mmol/L (ref 22–32)
CREATININE: 1.15 mg/dL (ref 0.61–1.24)
Chloride: 107 mmol/L (ref 101–111)
GFR calc non Af Amer: >60 mL/min (ref >60)
Glucose, Bld: 298 mg/dL — ABNORMAL HIGH (ref 65–99)
Potassium: 4.3 mmol/L (ref 3.5–5.1)
Sodium: 137 mmol/L (ref 135–145)

## 2016-04-04 LAB — CBC
HCT: 38.8 % — ABNORMAL LOW (ref 39.0–52.0)
Hemoglobin: 13.9 g/dL (ref 13.0–17.0)
MCH: 28.5 pg (ref 26.0–34.0)
MCHC: 35.8 g/dL (ref 30.0–36.0)
MCV: 79.7 fL (ref 78.0–100.0)
PLATELETS: 249 10*3/uL (ref 150–400)
RBC: 4.87 MIL/uL (ref 4.22–5.81)
RDW: 14.2 % (ref 11.5–15.5)
WBC: 6.9 10*3/uL (ref 4.0–10.5)

## 2016-04-04 LAB — GLUCOSE, CAPILLARY: GLUCOSE-CAPILLARY: 331 mg/dL — AB (ref 65–99)

## 2016-04-04 NOTE — Progress Notes (Addendum)
PCP:Dr. [name] @ [facility]  Cardiologist: Dr. McDowell/Katherine Purcell Nails PA  Pt. Instructed to hold plavix 5 days prior to surgery.  Last sleep study 8 yrs, ago  Hgb A1C 11.9 on 02/23/16. States fasting sugars 193-300.Dr. Juleen China manages diabetes. Short stay CBG: 331, allison Zelenack, PA notified.

## 2016-04-04 NOTE — Telephone Encounter (Signed)
Yes, I think it will be okay to hold medication in order to have the procedure.  Thanks.

## 2016-04-04 NOTE — Pre-Procedure Instructions (Signed)
Derrick [name]  04/04/2016      [facility] - [place], [place]  [address] Phone: [phone] Fax: [phone]    Your procedure is scheduled on Sept. 27  Report to [facility] Admitting at 9:30 A.M.  Call this number if you have problems the morning of surgery:  [phone]   Remember:  Do not eat food or drink liquids after midnight.  Take these medicines the morning of surgery with A SIP OF WATER : amlodipine (norvasc), baclofen if needed, levothyroxine (synthroid), metoprolol (lopressor), oxycodone            Stop 1 week prior to surgery: aspirin, advil, motrin, ibuprofen, BC,Goody's, naproxen, vitamins and herbal medicines.            Stop plavix 5 days prior to surgery per Dr. Redmond Baseman    How to Manage Your Diabetes Before and After Surgery  Why is it important to control my blood sugar before and after surgery? . Improving blood sugar levels before and after surgery helps healing and can limit problems. . A way of improving blood sugar control is eating a healthy diet by: o  Eating less sugar and carbohydrates o  Increasing activity/exercise o  Talking with your doctor about reaching your blood sugar goals . High blood sugars (greater than 180 mg/dL) can raise your risk of infections and slow your recovery, so you will need to focus on controlling your diabetes during the weeks before surgery. . Make sure that the doctor who takes care of your diabetes knows about your planned surgery including the date and location.  How do I manage my blood sugar before surgery? . Check your blood sugar at least 4 times a day, starting 2 days before surgery, to make sure that the level is not too high or low. o Check your blood sugar the morning of your surgery when you wake up and every 2 hours until you get to the Short Stay unit. . If your blood sugar is less than 70 mg/dL, you will need to treat for low blood  sugar: o Do not take insulin. o Treat a low blood sugar (less than 70 mg/dL) with  cup of clear juice (cranberry or apple), 4 glucose tablets, OR glucose gel. o Recheck blood sugar in 15 minutes after treatment (to make sure it is greater than 70 mg/dL). If your blood sugar is not greater than 70 mg/dL on recheck, call [phone] for further instructions. . Report your blood sugar to the short stay nurse when you get to Short Stay.  . If you are admitted to the hospital after surgery: o Your blood sugar will be checked by the staff and you will probably be given insulin after surgery (instead of oral diabetes medicines) to make sure you have good blood sugar levels. o The goal for blood sugar control after surgery is 80-180 mg/dL.              WHAT DO I DO ABOUT MY DIABETES MEDICATION?   Derrick [name] Kitchen Do not take oral diabetes medicines (pills) the morning of surgery.  . THE NIGHT BEFORE SURGERY, take _____22_____ units of ____lantus______insulin.         Do not wear jewelry.  Do not wear lotions, powders, or cologne, or deoderant.  Do not shave 48 hours prior to surgery.  Men may shave face and neck.  Do not bring valuables to the hospital.  Derrick [name] is not responsible for any belongings or valuables.  Contacts, dentures or bridgework may not be worn into surgery.  Leave your suitcase in the car.  After surgery it may be brought to your room.  For patients admitted to the hospital, discharge time will be determined by your treatment team.  Patients discharged the day of surgery will not be allowed to drive home.    Special instructions: review preparing for surgery  Please read over the following fact sheets that you were given.

## 2016-04-04 NOTE — Telephone Encounter (Signed)
Derrick [name] at [facility] - left message "okay to hold medication in order to have the procedure." per Dr Juleen China. Their office had called about holding pt's plavix.

## 2016-04-04 NOTE — Telephone Encounter (Signed)
Calling for for update
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
## 2016-04-10 ENCOUNTER — Ambulatory Visit (INDEPENDENT_AMBULATORY_CARE_PROVIDER_SITE_OTHER): Payer: Medicare Other | Admitting: Internal Medicine

## 2016-04-10 VITALS — BP 132/75 | HR 88 | Temp 98.6°F | Ht 68.0 in | Wt 261.5 lb

## 2016-04-10 DIAGNOSIS — Z87891 Personal history of nicotine dependence: Secondary | ICD-10-CM

## 2016-04-10 DIAGNOSIS — E1100 Type 2 diabetes mellitus with hyperosmolarity without nonketotic hyperglycemic-hyperosmolar coma (NKHHC): Secondary | ICD-10-CM

## 2016-04-10 DIAGNOSIS — Z794 Long term (current) use of insulin: Secondary | ICD-10-CM | POA: Diagnosis not present

## 2016-04-10 DIAGNOSIS — Z79899 Other long term (current) drug therapy: Secondary | ICD-10-CM

## 2016-04-10 DIAGNOSIS — I10 Essential (primary) hypertension: Secondary | ICD-10-CM

## 2016-04-10 DIAGNOSIS — E1165 Type 2 diabetes mellitus with hyperglycemia: Secondary | ICD-10-CM | POA: Diagnosis not present

## 2016-04-10 DIAGNOSIS — Z0000 Encounter for general adult medical examination without abnormal findings: Secondary | ICD-10-CM

## 2016-04-10 MED ORDER — INSULIN GLARGINE 100 UNIT/ML SOLOSTAR PEN
50.0000 [IU] | PEN_INJECTOR | Freq: Every day | SUBCUTANEOUS | 11 refills | Status: DC
Start: 2016-04-10 — End: 2016-05-25
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
## 2016-04-10 NOTE — Assessment & Plan Note (Signed)
Patient declined flu shot today.

## 2016-04-10 NOTE — Patient Instructions (Signed)
Thank you for coming to see me today. It was a pleasure. Today we talked about:   Please increase your Lantus to 50 units at bedtime.  Please check your blood sugars four times per day - Before breakfast, lunch, dinner, and at bedtime.  We have referred you to meet with Debera Lat for diabetes education.  Continue taking your blood pressure medications.    Please follow-up with [place] in 2 weeks.  If you have any questions or concerns, please do not hesitate to call the office at [phone].  Take Care,   [name], DO

## 2016-04-10 NOTE — Assessment & Plan Note (Signed)
A: Blood sugars continue to be uncontrolled on current insulin regimen and oral medications.  He has been taking 42 units nightly since our last appointment and only last night went up to 44 units.  Blood sugars since 9/14 have ranged from 175-367 in the morning.  The majority are greater than 200.  He and his daughter report working on an improved diet but there may be some indiscretion still as it pertains to a diabetic friendly diet.  He is agreeable to meeting with our diabetic educator as well as considering meal-time insulin if needed.  P:  - we will increase his Lantus to 50 units QHS.  This is still less than the 60 units he was on previous to his stroke and subsequent hospital stay - continue Tradjenta 5mg  daily and metformin 1000mg  BID - referral to [facility], diabetic educator - asked patient to begin checking his CBG more frequently - before meals and at bedtime as he expressed an ability to check QID. - RTC in 2 weeks with meter.

## 2016-04-10 NOTE — Progress Notes (Signed)
Internal Medicine Clinic Attending  Case discussed with Dr. Wallace at the time of the visit.  We reviewed the resident's history and exam and pertinent patient test results.  I agree with the assessment, diagnosis, and plan of care documented in the resident's note.  

## 2016-04-10 NOTE — Assessment & Plan Note (Signed)
BP Readings from Last 3 Encounters:  04/10/16 132/75  04/04/16 136/84  03/29/16 (!) 153/87   A:  BP much better today after starting HCTZ.  Denies any lightheadedness or dizziness, headaches, blurry vision or chest pain.  He is planning for surgery tomorrow for his vocal cord dysfunction.  P: - continue current medications: lisinopril 40mg  daily, amlodipine 10mg  daily, metoprolol tartrate 12.5mg  BID, and HCTZ 12.5mg  daily - encouraged low-sodium diet, exercise, and weight loss.

## 2016-04-11 ENCOUNTER — Encounter (HOSPITAL_COMMUNITY): Payer: Self-pay | Admitting: Surgery

## 2016-04-11 ENCOUNTER — Observation Stay (HOSPITAL_COMMUNITY)
Admission: RE | Admit: 2016-04-11 | Discharge: 2016-04-12 | Disposition: A | Discharge status: Home / Self Care | Payer: Medicare Other | Source: Ambulatory Visit | Attending: Otolaryngology | Admitting: Otolaryngology

## 2016-04-11 ENCOUNTER — Ambulatory Visit (HOSPITAL_COMMUNITY): Payer: Medicare Other | Admitting: Vascular Surgery

## 2016-04-11 ENCOUNTER — Ambulatory Visit (HOSPITAL_COMMUNITY): Payer: Medicare Other | Admitting: Certified Registered Nurse Anesthetist

## 2016-04-11 ENCOUNTER — Encounter (HOSPITAL_COMMUNITY)
Admission: RE | Disposition: A | Discharge status: Home / Self Care | Payer: Self-pay | Source: Ambulatory Visit | Attending: Otolaryngology

## 2016-04-11 DIAGNOSIS — R1313 Dysphagia, pharyngeal phase: Secondary | ICD-10-CM | POA: Diagnosis not present

## 2016-04-11 DIAGNOSIS — I252 Old myocardial infarction: Secondary | ICD-10-CM | POA: Insufficient documentation

## 2016-04-11 DIAGNOSIS — I2510 Atherosclerotic heart disease of native coronary artery without angina pectoris: Secondary | ICD-10-CM | POA: Diagnosis not present

## 2016-04-11 DIAGNOSIS — R061 Stridor: Secondary | ICD-10-CM | POA: Diagnosis not present

## 2016-04-11 DIAGNOSIS — E039 Hypothyroidism, unspecified: Secondary | ICD-10-CM | POA: Diagnosis not present

## 2016-04-11 DIAGNOSIS — E782 Mixed hyperlipidemia: Secondary | ICD-10-CM | POA: Diagnosis not present

## 2016-04-11 DIAGNOSIS — F17210 Nicotine dependence, cigarettes, uncomplicated: Secondary | ICD-10-CM | POA: Diagnosis not present

## 2016-04-11 DIAGNOSIS — R490 Dysphonia: Secondary | ICD-10-CM | POA: Diagnosis not present

## 2016-04-11 DIAGNOSIS — R918 Other nonspecific abnormal finding of lung field: Secondary | ICD-10-CM | POA: Diagnosis not present

## 2016-04-11 DIAGNOSIS — J3801 Paralysis of vocal cords and larynx, unilateral: Secondary | ICD-10-CM | POA: Diagnosis not present

## 2016-04-11 DIAGNOSIS — I10 Essential (primary) hypertension: Secondary | ICD-10-CM | POA: Diagnosis not present

## 2016-04-11 DIAGNOSIS — Z8673 Personal history of transient ischemic attack (TIA), and cerebral infarction without residual deficits: Secondary | ICD-10-CM | POA: Insufficient documentation

## 2016-04-11 DIAGNOSIS — R1310 Dysphagia, unspecified: Secondary | ICD-10-CM | POA: Insufficient documentation

## 2016-04-11 DIAGNOSIS — E119 Type 2 diabetes mellitus without complications: Secondary | ICD-10-CM | POA: Insufficient documentation

## 2016-04-11 DIAGNOSIS — Z794 Long term (current) use of insulin: Secondary | ICD-10-CM | POA: Insufficient documentation

## 2016-04-11 DIAGNOSIS — R0602 Shortness of breath: Secondary | ICD-10-CM | POA: Diagnosis not present

## 2016-04-11 DIAGNOSIS — J3800 Paralysis of vocal cords and larynx, unspecified: Secondary | ICD-10-CM | POA: Diagnosis not present

## 2016-04-11 HISTORY — PX: LARYNGOPLASTY: SHX282

## 2016-04-11 HISTORY — PX: LARYNGOSCOPY: SHX5203

## 2016-04-11 LAB — GLUCOSE, CAPILLARY
GLUCOSE-CAPILLARY: 240 mg/dL — AB (ref 65–99)
GLUCOSE-CAPILLARY: 302 mg/dL — AB (ref 65–99)
Glucose-Capillary: 231 mg/dL — ABNORMAL HIGH (ref 65–99)
Glucose-Capillary: 254 mg/dL — ABNORMAL HIGH (ref 65–99)

## 2016-04-11 SURGERY — LARYNGOPLASTY
Anesthesia: Monitor Anesthesia Care | Site: Neck

## 2016-04-11 MED ORDER — INSULIN ASPART 100 UNIT/ML ~~LOC~~ SOLN
0.0000 [IU] | Freq: Three times a day (TID) | SUBCUTANEOUS | Status: DC
  Administered 2016-04-11: 8 [IU] via SUBCUTANEOUS
  Administered 2016-04-12: 3 [IU] via SUBCUTANEOUS

## 2016-04-11 MED ORDER — LIDOCAINE HCL (CARDIAC) 20 MG/ML IV SOLN
INTRAVENOUS | Status: DC | PRN
  Administered 2016-04-11: 100 mg via INTRAVENOUS

## 2016-04-11 MED ORDER — INSULIN GLARGINE 100 UNIT/ML ~~LOC~~ SOLN
50.0000 [IU] | Freq: Every day | SUBCUTANEOUS | Status: DC
  Administered 2016-04-11: 50 [IU] via SUBCUTANEOUS
  Filled 2016-04-11 (×3): qty 0.5

## 2016-04-11 MED ORDER — MIDAZOLAM HCL 2 MG/2ML IJ SOLN
INTRAMUSCULAR | Status: AC
  Filled 2016-04-11: qty 2

## 2016-04-11 MED ORDER — OXYMETAZOLINE HCL 0.05 % NA SOLN
NASAL | Status: DC | PRN
  Administered 2016-04-11: 1

## 2016-04-11 MED ORDER — FENTANYL CITRATE (PF) 100 MCG/2ML IJ SOLN
INTRAMUSCULAR | Status: DC | PRN
  Administered 2016-04-11 (×2): 25 ug via INTRAVENOUS

## 2016-04-11 MED ORDER — AMLODIPINE BESYLATE 10 MG PO TABS
10.0000 mg | ORAL_TABLET | Freq: Every day | ORAL | Status: DC
  Administered 2016-04-12: 10 mg via ORAL
  Filled 2016-04-11: qty 1

## 2016-04-11 MED ORDER — PROPOFOL 10 MG/ML IV BOLUS
INTRAVENOUS | Status: AC
  Filled 2016-04-11: qty 20

## 2016-04-11 MED ORDER — LIDOCAINE-EPINEPHRINE 1 %-1:100000 IJ SOLN
INTRAMUSCULAR | Status: AC
  Filled 2016-04-11: qty 1

## 2016-04-11 MED ORDER — PROMETHAZINE HCL 25 MG/ML IJ SOLN: 6.2500 mg | INTRAMUSCULAR | Status: DC | PRN

## 2016-04-11 MED ORDER — METOPROLOL TARTRATE 12.5 MG HALF TABLET
12.5000 mg | ORAL_TABLET | Freq: Two times a day (BID) | ORAL | Status: DC
  Administered 2016-04-11 – 2016-04-12 (×2): 12.5 mg via ORAL
  Filled 2016-04-11 (×2): qty 1

## 2016-04-11 MED ORDER — ONDANSETRON HCL 4 MG/2ML IJ SOLN
INTRAMUSCULAR | Status: AC
  Filled 2016-04-11: qty 2

## 2016-04-11 MED ORDER — DEXMEDETOMIDINE HCL IN NACL 400 MCG/100ML IV SOLN
INTRAVENOUS | Status: DC | PRN
  Administered 2016-04-11: 0.7 ug/kg/h via INTRAVENOUS

## 2016-04-11 MED ORDER — LISINOPRIL 40 MG PO TABS
40.0000 mg | ORAL_TABLET | Freq: Every day | ORAL | Status: DC
  Administered 2016-04-11 – 2016-04-12 (×2): 40 mg via ORAL
  Filled 2016-04-11 (×2): qty 1

## 2016-04-11 MED ORDER — LIDOCAINE-EPINEPHRINE 1 %-1:100000 IJ SOLN
INTRAMUSCULAR | Status: DC | PRN
  Administered 2016-04-11: 20 mL

## 2016-04-11 MED ORDER — PROPOFOL 1000 MG/100ML IV EMUL
INTRAVENOUS | Status: AC
  Filled 2016-04-11: qty 100

## 2016-04-11 MED ORDER — CEFAZOLIN IN D5W 1 GM/50ML IV SOLN
1.0000 g | Freq: Three times a day (TID) | INTRAVENOUS | Status: DC
  Administered 2016-04-11 – 2016-04-12 (×2): 1 g via INTRAVENOUS
  Filled 2016-04-11 (×3): qty 50

## 2016-04-11 MED ORDER — CEFAZOLIN SODIUM 1 G IJ SOLR
INTRAMUSCULAR | Status: DC | PRN
Start: 2016-04-11 — End: 2016-04-11
  Administered 2016-04-11: 2 g via INTRAMUSCULAR

## 2016-04-11 MED ORDER — ONDANSETRON HCL 4 MG/2ML IJ SOLN: 4.0000 mg | INTRAMUSCULAR | Status: DC | PRN

## 2016-04-11 MED ORDER — LIDOCAINE 2% (20 MG/ML) 5 ML SYRINGE
INTRAMUSCULAR | Status: AC
  Filled 2016-04-11: qty 5

## 2016-04-11 MED ORDER — FENTANYL CITRATE (PF) 100 MCG/2ML IJ SOLN
INTRAMUSCULAR | Status: AC
  Administered 2016-04-11: 25 ug via INTRAVENOUS
  Filled 2016-04-11: qty 2

## 2016-04-11 MED ORDER — BACLOFEN 10 MG PO TABS: 10.0000 mg | ORAL_TABLET | Freq: Three times a day (TID) | ORAL | Status: DC | PRN

## 2016-04-11 MED ORDER — OXYCODONE HCL 5 MG PO TABS
10.0000 mg | ORAL_TABLET | Freq: Four times a day (QID) | ORAL | Status: DC
  Administered 2016-04-11 – 2016-04-12 (×3): 10 mg via ORAL
  Filled 2016-04-11 (×3): qty 2

## 2016-04-11 MED ORDER — MIDAZOLAM HCL 2 MG/2ML IJ SOLN
INTRAMUSCULAR | Status: DC | PRN
Start: 2016-04-11 — End: 2016-04-11
  Administered 2016-04-11: 2 mg via INTRAVENOUS

## 2016-04-11 MED ORDER — METFORMIN HCL 500 MG PO TABS
1000.0000 mg | ORAL_TABLET | Freq: Two times a day (BID) | ORAL | Status: DC
  Administered 2016-04-12: 1000 mg via ORAL
  Filled 2016-04-11 (×2): qty 2

## 2016-04-11 MED ORDER — INSULIN ASPART 100 UNIT/ML ~~LOC~~ SOLN
0.0000 [IU] | Freq: Every day | SUBCUTANEOUS | Status: DC
  Administered 2016-04-11: 4 [IU] via SUBCUTANEOUS

## 2016-04-11 MED ORDER — CHLORHEXIDINE GLUCONATE CLOTH 2 % EX PADS: 6.0000 | MEDICATED_PAD | Freq: Once | CUTANEOUS | Status: DC

## 2016-04-11 MED ORDER — FENTANYL CITRATE (PF) 100 MCG/2ML IJ SOLN
INTRAMUSCULAR | Status: AC
  Filled 2016-04-11: qty 4

## 2016-04-11 MED ORDER — PROPOFOL 10 MG/ML IV BOLUS
INTRAVENOUS | Status: DC | PRN
  Administered 2016-04-11: 20 mg via INTRAVENOUS

## 2016-04-11 MED ORDER — SIMVASTATIN 40 MG PO TABS
40.0000 mg | ORAL_TABLET | Freq: Every evening | ORAL | Status: DC
  Administered 2016-04-11: 40 mg via ORAL
  Filled 2016-04-11: qty 1

## 2016-04-11 MED ORDER — PROPOFOL 500 MG/50ML IV EMUL
INTRAVENOUS | Status: DC | PRN
  Administered 2016-04-11: 75 ug/kg/min via INTRAVENOUS

## 2016-04-11 MED ORDER — LINAGLIPTIN 5 MG PO TABS
5.0000 mg | ORAL_TABLET | Freq: Every day | ORAL | Status: DC
  Administered 2016-04-12: 5 mg via ORAL
  Filled 2016-04-11 (×2): qty 1

## 2016-04-11 MED ORDER — HYDROCHLOROTHIAZIDE 12.5 MG PO CAPS
12.5000 mg | ORAL_CAPSULE | Freq: Every day | ORAL | Status: DC
  Administered 2016-04-11 – 2016-04-12 (×2): 12.5 mg via ORAL
  Filled 2016-04-11 (×2): qty 1

## 2016-04-11 MED ORDER — GABAPENTIN 300 MG PO CAPS
300.0000 mg | ORAL_CAPSULE | Freq: Every day | ORAL | Status: DC
  Administered 2016-04-11: 300 mg via ORAL
  Filled 2016-04-11: qty 1

## 2016-04-11 MED ORDER — 0.9 % SODIUM CHLORIDE (POUR BTL) OPTIME
TOPICAL | Status: DC | PRN
Start: 2016-04-11 — End: 2016-04-11
  Administered 2016-04-11: 1000 mL

## 2016-04-11 MED ORDER — LEVOTHYROXINE SODIUM 75 MCG PO TABS
75.0000 ug | ORAL_TABLET | Freq: Every day | ORAL | Status: DC
  Administered 2016-04-12: 75 ug via ORAL
  Filled 2016-04-11: qty 1

## 2016-04-11 MED ORDER — ONDANSETRON HCL 4 MG PO TABS: 4.0000 mg | ORAL_TABLET | ORAL | Status: DC | PRN

## 2016-04-11 MED ORDER — LACTATED RINGERS IV SOLN
INTRAVENOUS | Status: DC
  Administered 2016-04-11 (×2): via INTRAVENOUS

## 2016-04-11 MED ORDER — POTASSIUM CHLORIDE IN NACL 20-0.45 MEQ/L-% IV SOLN
INTRAVENOUS | Status: DC
  Administered 2016-04-11: 21:00:00 via INTRAVENOUS
  Filled 2016-04-11 (×3): qty 1000

## 2016-04-11 MED ORDER — MORPHINE SULFATE (PF) 2 MG/ML IV SOLN
2.0000 mg | INTRAVENOUS | Status: DC | PRN
  Administered 2016-04-12: 2 mg via INTRAVENOUS
  Filled 2016-04-11: qty 1

## 2016-04-11 MED ORDER — OXYMETAZOLINE HCL 0.05 % NA SOLN
NASAL | Status: AC
  Filled 2016-04-11: qty 15

## 2016-04-11 MED ORDER — FENTANYL CITRATE (PF) 100 MCG/2ML IJ SOLN
25.0000 ug | INTRAMUSCULAR | Status: DC | PRN
  Administered 2016-04-11 (×3): 25 ug via INTRAVENOUS

## 2016-04-11 SURGICAL SUPPLY — 38 items
ADH SKN CLS APL DERMABOND .7 (GAUZE/BANDAGES/DRESSINGS) ×1
APL SKNCLS STERI-STRIP NONHPOA (GAUZE/BANDAGES/DRESSINGS) ×1
BENZOIN TINCTURE PRP APPL 2/3 (GAUZE/BANDAGES/DRESSINGS) ×3 IMPLANT
BLADE SCALPEL THERMAL 15 (MISCELLANEOUS) ×3 IMPLANT
BUR ROUND FLUTED 4 SOFT TCH (BURR) ×2 IMPLANT
BUR ROUND FLUTED 4MM SOFT TCH (BURR) ×1
CANISTER SUCTION 2500CC (MISCELLANEOUS) ×3 IMPLANT
CLEANER TIP ELECTROSURG 2X2 (MISCELLANEOUS) ×3 IMPLANT
COVER SURGICAL LIGHT HANDLE (MISCELLANEOUS) ×3 IMPLANT
CRADLE DONUT ADULT HEAD (MISCELLANEOUS) ×3 IMPLANT
DERMABOND ADVANCED (GAUZE/BANDAGES/DRESSINGS) ×2
DERMABOND ADVANCED .7 DNX12 (GAUZE/BANDAGES/DRESSINGS) ×1 IMPLANT
DRAIN HEMOVAC 7FR (DRAIN) ×3 IMPLANT
DRAPE PROXIMA HALF (DRAPES) ×3 IMPLANT
DRAPE SURG 17X23 STRL (DRAPES) ×3 IMPLANT
ELECT COATED BLADE 2.86 ST (ELECTRODE) ×3 IMPLANT
ELECT NDL BLADE 2-5/6 (NEEDLE) ×1 IMPLANT
ELECT NEEDLE BLADE 2-5/6 (NEEDLE) ×3 IMPLANT
ELECT REM PT RETURN 9FT ADLT (ELECTROSURGICAL) ×3
ELECTRODE REM PT RTRN 9FT ADLT (ELECTROSURGICAL) ×1 IMPLANT
EVACUATOR SILICONE 100CC (DRAIN) ×3 IMPLANT
GLOVE BIO SURGEON STRL SZ7.5 (GLOVE) ×9 IMPLANT
GOWN STRL REUS W/ TWL LRG LVL3 (GOWN DISPOSABLE) ×2 IMPLANT
GOWN STRL REUS W/TWL LRG LVL3 (GOWN DISPOSABLE) ×6
KIT BASIN OR (CUSTOM PROCEDURE TRAY) ×3 IMPLANT
KIT ROOM TURNOVER OR (KITS) ×3 IMPLANT
NDL HYPO 25GX1X1/2 BEV (NEEDLE) ×1 IMPLANT
NEEDLE HYPO 25GX1X1/2 BEV (NEEDLE) ×3 IMPLANT
NS IRRIG 1000ML POUR BTL (IV SOLUTION) ×3 IMPLANT
PAD ARMBOARD 7.5X6 YLW CONV (MISCELLANEOUS) ×6 IMPLANT
PATTIES SURGICAL .5 X3 (DISPOSABLE) ×3 IMPLANT
PENCIL BUTTON HOLSTER BLD 10FT (ELECTRODE) ×3 IMPLANT
SPONGE INTESTINAL PEANUT (DISPOSABLE) ×3 IMPLANT
SUT ETHILON 2 0 FS 18 (SUTURE) ×12 IMPLANT
SUT VIC AB 3-0 SH 27 (SUTURE) ×6
SUT VIC AB 3-0 SH 27X BRD (SUTURE) ×2 IMPLANT
SUT VICRYL 4-0 PS2 18IN ABS (SUTURE) ×2 IMPLANT
TRAY ENT MC OR (CUSTOM PROCEDURE TRAY) ×3 IMPLANT

## 2016-04-11 NOTE — Brief Op Note (Signed)
04/11/2016  1:37 PM  PATIENT:  Derrick [name]  65 y.o. male  PRE-OPERATIVE DIAGNOSIS:  dysphonia and dysphagia, left vocal fold paralysis  POST-OPERATIVE DIAGNOSIS:  dysphonia and dysphagia, left vocal fold paralysis  PROCEDURE:  Procedure(s) with comments: LARYNGOPLASTY (N/A) - medializaton laryngoplasty  SURGEON:  Surgeon(s) and Role:    * [name], MD - Primary  PHYSICIAN ASSISTANT:   ASSISTANTS: none   ANESTHESIA:   IV sedation  EBL:  No intake/output data recorded.  BLOOD ADMINISTERED:none  DRAINS: (7 Fr) Jackson-Pratt drain(s) with closed bulb suction in the neck   LOCAL MEDICATIONS USED:  LIDOCAINE   SPECIMEN:  No Specimen  DISPOSITION OF SPECIMEN:  N/A  COUNTS:  YES  TOURNIQUET:  * No tourniquets in log *  DICTATION: .Other Dictation: Dictation Number [id number]  PLAN OF CARE: Admit for overnight observation  PATIENT DISPOSITION:  PACU - hemodynamically stable.   Delay start of Pharmacological VTE agent (>24hrs) due to surgical blood loss or risk of bleeding: yes

## 2016-04-11 NOTE — Progress Notes (Signed)
   ENT Progress Note: s/p Procedure(s): LARYNGOPLASTY    Subjective: Min pain, excellent voice  Objective: Vital signs in last 24 hours: Temp:  [98.3 F (36.8 C)-98.7 F (37.1 C)] 98.7 F (37.1 C) (09/27 1632) Pulse Rate:  [56-63] 62 (09/27 1531) Resp:  [12-20] 16 (09/27 1632) BP: (113-150)/(64-92) 150/87 (09/27 1632) SpO2:  [93 %-99 %] 99 % (09/27 1632) Weight:  [118.6 kg (261 lb 8 oz)] 118.6 kg (261 lb 8 oz) (09/27 0922) Weight change:     Intake/Output from previous day: No intake/output data recorded. Intake/Output this shift: Total I/O In: 900 [I.V.:900] Out: 15 [Blood:15]  Labs: No results for input(s): WBC, HGB, HCT, PLT in the last 72 hours. No results for input(s): NA, K, CL, CO2, GLUCOSE, BUN, CALCIUM in the last 72 hours.  Invalid input(s): CREATININR  Studies/Results: No results found.   PHYSICAL EXAM: Inc intact jp stable with min output Airway stable   Assessment/Plan: Pt stable postop Monitor O/N    Yakelin Grenier 04/11/2016, 4:59 PM

## 2016-04-11 NOTE — Anesthesia Preprocedure Evaluation (Addendum)
Anesthesia Evaluation  Patient identified by MRN, date of birth, ID band Patient awake    Reviewed: Allergy & Precautions, NPO status , Patient's Chart, lab work & pertinent test results  History of Anesthesia Complications Negative for: history of anesthetic complications  Airway Mallampati: II  TM Distance: >3 FB     Dental  (+) Edentulous Upper, Poor Dentition   Pulmonary sleep apnea and Continuous Positive Airway Pressure Ventilation , former smoker,  Does not use CPAP   Pulmonary exam normal breath sounds clear to auscultation       Cardiovascular hypertension, Pt. on medications (-) angina+ CAD  (-) CHF  Rhythm:Regular Rate:Normal     Neuro/Psych  Headaches, neg Seizures Anxiety Depression Bipolar Disorder Rt. Leg weak.  Uses cane CVA, Residual Symptoms    GI/Hepatic Neg liver ROS, Bowel prep,Ascending colon tumor.  Abdominal pain.   Endo/Other  diabetes, Type 2, Insulin DependentHypothyroidism Morbid obesity  Renal/GU negative Renal ROS     Musculoskeletal  (+) Arthritis ,   Abdominal (+) + obese,  Abdomen: tender.    Peds  Hematology negative hematology ROS (+)   Anesthesia Other Findings   Reproductive/Obstetrics                             Anesthesia Physical  Anesthesia Plan  ASA: III  Anesthesia Plan: MAC   Post-op Pain Management:    Induction: Intravenous  Airway Management Planned: Nasal Cannula  Additional Equipment: None  Intra-op Plan:   Post-operative Plan: Extubation in OR  Informed Consent: I have reviewed the patients History and Physical, chart, labs and discussed the procedure including the risks, benefits and alternatives for the proposed anesthesia with the patient or authorized representative who has indicated his/her understanding and acceptance.   Dental advisory given  Plan Discussed with: CRNA, Anesthesiologist and Surgeon  Anesthesia  Plan Comments:        Anesthesia Quick Evaluation

## 2016-04-11 NOTE — Anesthesia Postprocedure Evaluation (Signed)
Anesthesia Post Note  Patient: Derrick [name]  Procedure(s) Performed: Procedure(s) (LRB): LARYNGOPLASTY (N/A) LARYNGOSCOPY (N/A)  Patient location during evaluation: PACU Anesthesia Type: MAC Level of consciousness: awake and alert Pain management: pain level controlled Vital Signs Assessment: post-procedure vital signs reviewed and stable Respiratory status: spontaneous breathing, nonlabored ventilation, respiratory function stable and patient connected to nasal cannula oxygen Cardiovascular status: stable and blood pressure returned to baseline Anesthetic complications: no    Last Vitals:  Vitals:   04/11/16 1446 04/11/16 1501  BP: (!) 132/91 131/90  Pulse: 62 (!) 59  Resp: 13 17  Temp:      Last Pain:  Vitals:   04/11/16 1450  TempSrc:   PainSc: [place]
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
## 2016-04-11 NOTE — Transfer of Care (Signed)
Immediate Anesthesia Transfer of Care Note  Patient: Derrick [name]  Procedure(s) Performed: Procedure(s) with comments: LARYNGOPLASTY (N/A) - medializaton laryngoplasty LARYNGOSCOPY (N/A)  Patient Location: PACU  Anesthesia Type:MAC  Level of Consciousness: awake, alert  and oriented  Airway & Oxygen Therapy: Patient Spontanous Breathing and Patient connected to nasal cannula oxygen  Post-op Assessment: Report given to RN and Post -op Vital signs reviewed and stable  Post vital signs: Reviewed and stable  Last Vitals:  Vitals:   04/11/16 0922  BP: (!) 148/86  Pulse: 63  Resp: 18  Temp: 37 C    Last Pain:  Vitals:   04/11/16 0934  TempSrc:   PainSc: 4       Patients Stated Pain Goal: 4 ([id number])  Complications: No apparent anesthesia complications

## 2016-04-12 ENCOUNTER — Emergency Department (HOSPITAL_COMMUNITY): Payer: Medicare Other

## 2016-04-12 ENCOUNTER — Other Ambulatory Visit: Payer: Self-pay

## 2016-04-12 ENCOUNTER — Encounter (HOSPITAL_COMMUNITY): Payer: Self-pay | Admitting: Otolaryngology

## 2016-04-12 ENCOUNTER — Emergency Department (HOSPITAL_COMMUNITY)
Admission: EM | Admit: 2016-04-12 | Discharge: 2016-04-13 | Disposition: A | Discharge status: Home / Self Care | Payer: Medicare Other | Source: Home / Self Care | Attending: Emergency Medicine | Admitting: Emergency Medicine

## 2016-04-12 DIAGNOSIS — Z79899 Other long term (current) drug therapy: Secondary | ICD-10-CM | POA: Insufficient documentation

## 2016-04-12 DIAGNOSIS — R061 Stridor: Secondary | ICD-10-CM | POA: Diagnosis not present

## 2016-04-12 DIAGNOSIS — E039 Hypothyroidism, unspecified: Secondary | ICD-10-CM | POA: Insufficient documentation

## 2016-04-12 DIAGNOSIS — Z7984 Long term (current) use of oral hypoglycemic drugs: Secondary | ICD-10-CM

## 2016-04-12 DIAGNOSIS — R0602 Shortness of breath: Secondary | ICD-10-CM | POA: Diagnosis not present

## 2016-04-12 DIAGNOSIS — E119 Type 2 diabetes mellitus without complications: Secondary | ICD-10-CM | POA: Insufficient documentation

## 2016-04-12 DIAGNOSIS — Z85048 Personal history of other malignant neoplasm of rectum, rectosigmoid junction, and anus: Secondary | ICD-10-CM | POA: Insufficient documentation

## 2016-04-12 DIAGNOSIS — Z794 Long term (current) use of insulin: Secondary | ICD-10-CM

## 2016-04-12 DIAGNOSIS — J3801 Paralysis of vocal cords and larynx, unilateral: Secondary | ICD-10-CM | POA: Diagnosis not present

## 2016-04-12 DIAGNOSIS — Z87891 Personal history of nicotine dependence: Secondary | ICD-10-CM | POA: Insufficient documentation

## 2016-04-12 DIAGNOSIS — R918 Other nonspecific abnormal finding of lung field: Secondary | ICD-10-CM | POA: Diagnosis not present

## 2016-04-12 DIAGNOSIS — I2510 Atherosclerotic heart disease of native coronary artery without angina pectoris: Secondary | ICD-10-CM | POA: Insufficient documentation

## 2016-04-12 DIAGNOSIS — I10 Essential (primary) hypertension: Secondary | ICD-10-CM | POA: Insufficient documentation

## 2016-04-12 LAB — CBC WITH DIFFERENTIAL/PLATELET
BASOS ABS: 0 10*3/uL (ref 0.0–0.1)
Basophils Relative: 0 %
Eosinophils Absolute: 0.4 10*3/uL (ref 0.0–0.7)
Eosinophils Relative: 4 %
HEMATOCRIT: 38.2 % — AB (ref 39.0–52.0)
HEMOGLOBIN: 13.8 g/dL (ref 13.0–17.0)
LYMPHS PCT: 33 %
Lymphs Abs: 2.6 10*3/uL (ref 0.7–4.0)
MCH: 28.7 pg (ref 26.0–34.0)
MCHC: 36.1 g/dL — ABNORMAL HIGH (ref 30.0–36.0)
MCV: 79.4 fL (ref 78.0–100.0)
Monocytes Absolute: 0.8 10*3/uL (ref 0.1–1.0)
Monocytes Relative: 10 %
NEUTROS ABS: 4.3 10*3/uL (ref 1.7–7.7)
NEUTROS PCT: 53 %
PLATELETS: 236 10*3/uL (ref 150–400)
RBC: 4.81 MIL/uL (ref 4.22–5.81)
RDW: 13.8 % (ref 11.5–15.5)
WBC: 8 10*3/uL (ref 4.0–10.5)

## 2016-04-12 LAB — GLUCOSE, CAPILLARY: GLUCOSE-CAPILLARY: 188 mg/dL — AB (ref 65–99)

## 2016-04-12 MED ORDER — ATORVASTATIN CALCIUM 20 MG PO TABS: 20.0000 mg | ORAL_TABLET | Freq: Every day | ORAL | Status: DC

## 2016-04-12 NOTE — ED Triage Notes (Addendum)
Pt c/o SOB that started today. Pt had surgery on paralyzed vocal cords on Wed. Pt was released ffrom the hospital yesterday. SO states pt had same type of breathing yesterday at release but it has become worse today. Pt O2 sat 98% on room air. Difficult to assess lung sounds due to pt vocalization. Pt able to speak and denies pain.

## 2016-04-12 NOTE — Op Note (Signed)
NAMEKAVYN, RAMKISSOON NO.:  [id number]  MEDICAL RECORD NO.:  [id number]  LOCATION:  [id number]                        FACILITY:  [facility]  PHYSICIAN:  Onnie Graham, MD     DATE OF BIRTH:  26-Apr-1951  DATE OF PROCEDURE:  04/11/2016 DATE OF DISCHARGE:                              OPERATIVE REPORT   PREOPERATIVE DIAGNOSES: 1. Left vocal cord paralysis. 2. Dysphonia. 3. Dysphagia.  POSTOPERATIVE DIAGNOSES: 1. Left vocal cord paralysis. 2. Dysphonia. 3. Dysphagia.  PROCEDURE:  Left medialization laryngoplasty.  SURGEON:  Onnie Graham, MD  ANESTHESIA:  IV sedation.  COMPLICATIONS:  None.  INDICATION:  The patient is a 65 year old male, who had a stroke in April resulting in dysphonia and dysphagia.  He required a G-tube for feedings for number of months, but that has now been removed.  He was found to have a paralyzed left vocal fold resulting in a high-pitched and breathy dysphonia and contributing to his dysphagia.  He presents to the operating room for surgical management.  FINDINGS:  The left vocal fold was confirmed to be immobile and bowed outward in surgery.  A Silastic implant was carved and placed within the vocal fold medializing it for an improved voice.  DESCRIPTION OF PROCEDURE:  The patient was identified in the holding room, informed consent having been obtained including discussion of risks, benefits, alternatives, the patient was brought to the operative suite and put on the operating table in supine position.  The patient was given intravenous sedation.  Afrin-soaked pledgets were placed in both sides of the nose and the patient was given intravenous antibiotics during the case.  After few minutes, the pledgets were removed and a flexible laryngoscope was placed through the left nasal passage and placed into the supraglottic position.  The scope was placed on a Mayo stand at the head of the bed for surgery.  The neck incision area was  injected with local anesthesia after the patient was under sedation, and the neck was then prepped and draped in sterile fashion with the face draped with a plastic drape. The neck incision was made with a 15 blade scalpel and extended through the subcutaneous tissues and platysma muscle using Bovie electrocautery. The incision was made in the previously present scar.  The midline raphe were divided and strap muscles were retracted over the left side of the thyroid cartilage.  Soft tissues were elevated off the thyroid cartilage.  The patient was in and out of adequate levels of sedation and having some obstructive events versus becoming agitated, and so the surgery was paused at this point and sedation adjusted.  The larynx was then retracted to the right using a skin hook, and the strap muscles retracted to the left.  Window was marked with a 15 blade scalpel, but the cartilage was quite firm.  A cutting drill bit was then used to remove the window of cartilage down to the periosteum on the inner surface.  The periosteum was divided with 15 blade scalpel.  Soft tissues inside were palpated and the laryngoscope view evaluated while listening to the voice.  Window had to be adjusted more anteriorly and a little bit  inferiorly using the drill.  Ultimately, the voice was able to be improved upon by pressing in the middle of the wound.  A Silastic implant was then carved using a Shaw knife and Silastic block.  It was trimmed and the edges carved down.  A 2-0 nylon suture was placed through the implant for stay suture.  The implant was then placed through the window into the larynx, and the patient's voice improved nicely.  Some slight adjustments to positioning were performed.  One suture was used to suture the implant into the periosteum.  The stay suture was removed.  The patient's voice was in very good shape.  The vocal folds were able to close well according to the laparoscopic  view. Thus, the self-retaining tractor was added at the beginning of surgery was removed.  Wound was copiously irrigated with saline.  A 7-French round drain was placed in the depth of the wound and secured to the skin using 2-0 nylon suture in a standard drain stitch.  The strap muscles were closed with 3-0 Vicryl suture and subcutaneous layer was closed with 4-0 Vicryl suture in a simple interrupted fashion.  The skin was closed with Dermabond.  The patient was cleaned off and the drain was then taped to the right shoulder hooking it to bulb suction.  The patient was returned to anesthesia for wake up and was moved to recovery room in stable condition.     Onnie Graham, MD     DDB/MEDQ  D:  04/11/2016  T:  04/12/2016  Job:  [id number]
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
## 2016-04-12 NOTE — Progress Notes (Signed)
Pt discharged to home.  Discharge instructions explained to pt.  Pt has no questions at the time of discharge.  IV removed.  Pt states he has all belongings.  Pt taken off unit via wheelchair by volunteer services.

## 2016-04-12 NOTE — ED Notes (Signed)
Patient transported to X-ray 

## 2016-04-12 NOTE — Discharge Summary (Signed)
Physician Discharge Summary  Patient ID: Derrick [name] MRN: [id number] DOB/AGE: 20-Nov-1950 65 y.o.  Admit date: 04/11/2016 Discharge date: 04/12/2016  Admission Diagnoses: Left vocal cord paralysis, dysphonia, dysphagia  Discharge Diagnoses:  Active Problems:   Vocal cord paralysis   Discharged Condition: good  Hospital Course: 65 year old male with diabetes who had a stroke in April was left with left vocal cord paralysis with associated dysphonia and dysphagia.  He presented for surgical management.  See operative note.  He was observed overnight with drain in place and did well.  His voice and swallow are improved.  He has a congested feeling in his chest.  He had no trouble overnight.  On POD 1, the drain was removed and he was felt stable for discharge.  Consults: None  Significant Diagnostic Studies: None  Treatments: surgery: Left medialization laryngoplasty  Discharge Exam: Blood pressure (!) 151/84, pulse 80, temperature 98.5 F (36.9 C), resp. rate 19, height '5\' 8"'  (1.727 m), weight 118.6 kg (261 lb 8 oz), SpO2 99 %. General appearance: alert, cooperative, no distress and faint inspiratory stridor Neck: neck incision clean and intact, no fluid collection, drain removed  Disposition: 01-Home or Self Care  Discharge Instructions    Diet - low sodium heart healthy    Complete by:  As directed    Discharge instructions    Complete by:  As directed    Resume regular diet.  Keep head elevated.  Limit voice use.  Avoid strenuous activity.  OK to allow incision to get wet, gently pat dry.  Do not apply ointment to the incision.   Increase activity slowly    Complete by:  As directed        Medication List    TAKE these medications   amLODipine 10 MG tablet Commonly known as:  NORVASC Take 1 tablet (10 mg total) by mouth daily.   baclofen 10 MG tablet Commonly known as:  LIORESAL Take 1 tablet (10 mg total) by mouth every 8 (eight) hours as needed (for hiccups).    clopidogrel 75 MG tablet Commonly known as:  PLAVIX Take 1 tablet (75 mg total) by mouth daily.   gabapentin 300 MG capsule Commonly known as:  NEURONTIN Take 1 capsule (300 mg total) by mouth at bedtime.   glucose blood test strip Commonly known as:  ONE TOUCH ULTRA TEST Use as instructed   hydrochlorothiazide 12.5 MG capsule Commonly known as:  MICROZIDE Take 1 capsule (12.5 mg total) by mouth daily.   Insulin Glargine 100 UNIT/ML Solostar Pen Commonly known as:  LANTUS SOLOSTAR Inject 50 Units into the skin daily at 10 pm.   Insulin Pen Needle 31G X 5 MM Misc Use to inject your Lantus   levothyroxine 75 MCG tablet Commonly known as:  SYNTHROID, LEVOTHROID Take 1 tablet (75 mcg total) by mouth daily before breakfast.   linagliptin 5 MG Tabs tablet Commonly known as:  TRADJENTA Take 1 tablet (5 mg total) by mouth daily.   lisinopril 40 MG tablet Commonly known as:  PRINIVIL,ZESTRIL Take 1 tablet (40 mg total) by mouth daily.   metFORMIN 1000 MG tablet Commonly known as:  GLUCOPHAGE Take 1 tablet (1,000 mg total) by mouth 2 (two) times daily. Reported on 08/24/2015   metoprolol tartrate 25 MG tablet Commonly known as:  LOPRESSOR Take 0.5 tablets (12.5 mg total) by mouth 2 (two) times daily.   ONE TOUCH ULTRA MINI w/Device Kit Use to check blood sugars daily   ONETOUCH  DELICA LANCETS 82B Misc Use to check blood sugar once daily.   Oxycodone HCl 10 MG Tabs Take 10 mg by mouth 4 (four) times daily.   simvastatin 40 MG tablet Commonly known as:  ZOCOR Take 1 tablet (40 mg total) by mouth every evening.      Follow-up Information    Morgyn Marut, MD. Schedule an appointment as soon as possible for a visit in 2 week(s).   Specialty:  Otolaryngology Contact information: [address] [place] [address] [phone]           Signed: [name] 04/12/2016, 8:58 AM

## 2016-04-12 NOTE — ED Notes (Signed)
Pt returned from xray,  

## 2016-04-12 NOTE — ED Notes (Signed)
MD at bedside. 

## 2016-04-12 NOTE — ED Notes (Signed)
Patient transported to CT 

## 2016-04-13 DIAGNOSIS — J3801 Paralysis of vocal cords and larynx, unilateral: Secondary | ICD-10-CM | POA: Diagnosis not present

## 2016-04-13 LAB — BASIC METABOLIC PANEL
Anion gap: 8 (ref 5–15)
BUN: 14 mg/dL (ref 6–20)
CALCIUM: 9.1 mg/dL (ref 8.9–10.3)
CO2: 25 mmol/L (ref 22–32)
CREATININE: 1.12 mg/dL (ref 0.61–1.24)
Chloride: 105 mmol/L (ref 101–111)
GFR calc Af Amer: >60 mL/min (ref >60)
Glucose, Bld: 261 mg/dL — ABNORMAL HIGH (ref 65–99)
Potassium: 3.9 mmol/L (ref 3.5–5.1)
SODIUM: 138 mmol/L (ref 135–145)

## 2016-04-13 LAB — CBG MONITORING, ED: GLUCOSE-CAPILLARY: 284 mg/dL — AB (ref 65–99)

## 2016-04-13 MED ORDER — DEXAMETHASONE SODIUM PHOSPHATE 10 MG/ML IJ SOLN
10.0000 mg | Freq: Once | INTRAMUSCULAR | Status: AC
  Administered 2016-04-13: 10 mg via INTRAVENOUS
  Filled 2016-04-13: qty 1

## 2016-04-13 MED ORDER — INSULIN ASPART 100 UNIT/ML ~~LOC~~ SOLN
8.0000 [IU] | Freq: Once | SUBCUTANEOUS | Status: AC
  Administered 2016-04-13: 8 [IU] via SUBCUTANEOUS
  Filled 2016-04-13: qty 1

## 2016-04-13 MED ORDER — RACEPINEPHRINE HCL 2.25 % IN NEBU
0.5000 mL | INHALATION_SOLUTION | Freq: Once | RESPIRATORY_TRACT | Status: AC
  Administered 2016-04-13: 0.5 mL via RESPIRATORY_TRACT
  Filled 2016-04-13: qty 0.5

## 2016-04-13 NOTE — ED Notes (Signed)
Pt updated on plan of care, family remains at bedside,  

## 2016-04-13 NOTE — ED Notes (Signed)
Pt states that he did not take his lantus last night,

## 2016-04-13 NOTE — ED Notes (Signed)
resp paged for breathing treatment,  

## 2016-04-13 NOTE — ED Notes (Signed)
Pt resting with eyes closed, no distress noted, pulse ox remains 97% on RA<

## 2016-04-13 NOTE — ED Notes (Signed)
Pt states that he is feeling better after the breathing treatment,

## 2016-04-13 NOTE — ED Notes (Signed)
Pt reports that he had laryngoplasty at [facility] yesterday, was discharged today, advised that he has become increasing sob tonight,

## 2016-04-13 NOTE — ED Notes (Signed)
Pt reports that he is feeling better, family remains at bedside,

## 2016-04-16 ENCOUNTER — Other Ambulatory Visit: Payer: Self-pay | Admitting: Internal Medicine

## 2016-04-16 DIAGNOSIS — Z794 Long term (current) use of insulin: Principal | ICD-10-CM

## 2016-04-16 DIAGNOSIS — E1100 Type 2 diabetes mellitus with hyperosmolarity without nonketotic hyperglycemic-hyperosmolar coma (NKHHC): Secondary | ICD-10-CM

## 2016-04-16 MED ORDER — GLUCOSE BLOOD VI STRP: ORAL_STRIP | 12 refills | Status: DC

## 2016-04-16 NOTE — Telephone Encounter (Signed)
Needs new rx for lantus and test strips sent to pharmacy [facility]

## 2016-04-23 ENCOUNTER — Other Ambulatory Visit: Payer: Self-pay | Admitting: Internal Medicine

## 2016-04-23 NOTE — Telephone Encounter (Signed)
Pt requesting  On his refill on his Lancets.  He is completely out.

## 2016-04-23 NOTE — Telephone Encounter (Signed)
Spoke w/ pharmacist at [facility], he states pt just picked up lancets 10/3 and has refills there, he will call pt and see what the problem is

## 2016-04-24 ENCOUNTER — Ambulatory Visit (INDEPENDENT_AMBULATORY_CARE_PROVIDER_SITE_OTHER): Payer: Medicare Other | Admitting: Dietician

## 2016-04-24 ENCOUNTER — Ambulatory Visit (INDEPENDENT_AMBULATORY_CARE_PROVIDER_SITE_OTHER): Payer: Medicare Other | Admitting: Internal Medicine

## 2016-04-24 VITALS — BP 117/75 | HR 71 | Temp 98.1°F | Ht 68.0 in | Wt 259.8 lb

## 2016-04-24 DIAGNOSIS — Z794 Long term (current) use of insulin: Secondary | ICD-10-CM

## 2016-04-24 DIAGNOSIS — Z713 Dietary counseling and surveillance: Secondary | ICD-10-CM | POA: Diagnosis not present

## 2016-04-24 DIAGNOSIS — E1165 Type 2 diabetes mellitus with hyperglycemia: Secondary | ICD-10-CM

## 2016-04-24 DIAGNOSIS — E1100 Type 2 diabetes mellitus with hyperosmolarity without nonketotic hyperglycemic-hyperosmolar coma (NKHHC): Secondary | ICD-10-CM

## 2016-04-24 MED ORDER — INSULIN ASPART 100 UNIT/ML FLEXPEN: 10.0000 [IU] | PEN_INJECTOR | Freq: Every day | SUBCUTANEOUS | 11 refills | Status: AC

## 2016-04-24 NOTE — Patient Instructions (Signed)
It was a pleasure to meet you Derrick [name].  For your diabetes, please continue the following: Tradjenta 5 mg daily Metformin 1000 mg twice a day Lantus 50 units at night  We will add the following: Insulin Aspart (Novolog) 10 units with supper (last meal of the day).  Please continue to check your blood sugars and bring your meter on your next visit.  Please follow up with [place] in about 2 weeks.

## 2016-04-24 NOTE — Assessment & Plan Note (Addendum)
Most recent Hgb A1c was 11.9 in August 2017. Patient seen 2 weeks ago in clinic. At the time his Lantus was increased from 42-44 units qhs to 50 units qhs. He reports adherence to this and continued his Tradjenta 5 mg daily and Metformin 1000 mg BID. He was referred to our diabetic educator Debera Lat, RD and was seen earlier today. Patient brought his meter today. Average blood sugars at home are 342 with a high of 591 and low of 175. Morning/noon averages range from 255-278 and PM averages range form 411-475 with 2 values above 500. Patient did have laryngoplasty for vocal cord dysfunction on 04/11/16. He was seen in the ED on 04/12/16 for stridor and treated with racemic epi nebulizer and given a single dose of Decadron.  Patient's blood sugars still not well controlled, although he has had stress, surgery, and received steroids in the interim of his last visit. He met with Debera Lat, RD today and educated on a more diabetic friendly diet which we hope he will make the necessary changes. We will add mealtime insulin with supper given his PM blood sugars are consistently elevated above 400. He is agreeable to this change. -Continue Lantus 50 units qhs -Start Novolog (insulin aspart) 10 units daily with supper (last meal of the day) -Continue Tradjenta 5 mg qd and Metformin 1000 mg BID -Continue to monitor CBGs, bring meter on f/u -Dietary changes -Repeat Hgb A1c in 1 month -f/u in 2-3 weeks

## 2016-04-24 NOTE — Patient Instructions (Signed)
It is important to try to keep your carbs (starches and sugars, fruit and dairy) consistent (not too low and not too high)  throughout the day- particularly if you are taking meal time insulin.   Can substitute one portion of fruit for starch or milk or yogurt( or sweets) . They are all carbs and will all increase your blood sugar.  Your current meal plan shows about 30-40 grams carbohydrate at each meal and 15-25 gram carb at snacks.  A plan for your meal might look like this:   Breakfast- eat about 30-40 grams of carb  Snack- eat about 15-25 grams of carb  Lunch-eat about 30-40 grams of carb  Snack-eat about 15-25 grams of carb  Snack- eat about 15-25 grams of carb  Dinner-eat about 30-40 grams of carb  Please make a follow up with Butch Penny in 4 weeks    b
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
## 2016-04-24 NOTE — Progress Notes (Addendum)
  Medical Nutrition Therapy:  Appt start time: 0915 end time:  [id number]. Visit # 1  Assessment:  Primary concerns today: meal planning for high blood sugars.  [name] reports he has had type 2 diabetes for ~15 years. He. is here today with his daughter who cooks and shops for him. He has a limited,  basic understanding of diabetes meal planning. His daughter says she works with people with diabetes and is knowledgeable and has been working with her father to get him to comply. He reports his blood sugars were okay until he was taken off insulin, then given a steroid injection after his throat surgery. The steroids increased his blood sugar significantly. He is interested in mealtime insulin.  His diet recall shows high fat, salt and sugar food choices, with insufficient fiber, fruits, vegetables.   Preferred Learning Style:  No preference indicated  Learning Readiness: Ready and Change in progress  ANTHROPOMETRICS: weight-260.1#, height-68", BMI-39.37- obese class II WEIGHT HISTORY:stable ~ 260# SLEEP:not addressed today MEDICATIONS: willing to take meal time injections, currently taking lantus 50 units nightly and metformin 100 mg twice a day, calculated TDD- 50-75 units/day, 35-45 is likely basal amount needed to control his blood sugarsICR 1 unit to 6 grams carb, correction 1: 25 BLOOD SUGAR:meter downloaded: average is 345, fasting 235 today, no hypoglycemia, 85% of readings above target DIETARY INTAKE: Usual eating pattern includes 3 meals and 3 snacks per day. 24-hr recall:  B ( AM): 2 eggs, 1 toast, 1 sausage, sweet green tea Snk ( AM):  pack of 6 crackers with water or 16 oz sweet green tea L ( PM): sandwich and sometimes chips or cooke or vienna sausage or potted meat and 6 crackers, water or sweet green tea 16 oz Snk ( PM):  pack of 6 crackers with water or 16 oz sweet green tea Snk ( PM): pack of 6 crackers with water or 16 oz sweet green tea D ( 8 PM): shrimp scampi ~ 1 cup or rice  and meat and greens, water or 16 ounces sweetened green tea Beverages: water and 3 16 oz containers sweet green tea/day  Usual physical activity: more limited lately due to stroke and surgery  1800-2000 calories per day for ~ 1 # week weight loss 80-90 g protein/day 180 grams carb/day ~ 12 servings/day   Progress Towards Goal(s):  In progress.   Nutritional Diagnosis:  NB-1.1 Food and nutrition-related knowledge deficit As related to lack of sufficient prior diabetes training.  As evidenced by  his report and questions.    Intervention:  Nutrition education on carb consistent meal plan. . Coordination of care: recommend meal time insulin 5-6 units with each meal. Monitoring blood sugar as he may need basal decreased to accommodate prandial insulin. To be safe could decrease lantus to 45 when meal time insulin started.   Teaching Method Utilized: Visual,Auditory, Hands on Handouts given during visit include:diabetes meal planning booklet Barriers to learning/adherence to lifestyle change: competing values Demonstrated degree of understanding via:  Teach Back   Monitoring/Evaluation:  Dietary intake, exercise, meter, and body weight in 4 week(s).

## 2016-05-02 ENCOUNTER — Other Ambulatory Visit: Payer: Self-pay | Admitting: Internal Medicine

## 2016-05-02 DIAGNOSIS — I10 Essential (primary) hypertension: Secondary | ICD-10-CM

## 2016-05-02 MED ORDER — LEVOTHYROXINE SODIUM 75 MCG PO TABS: 75.0000 ug | ORAL_TABLET | Freq: Every day | ORAL | 0 refills | Status: AC

## 2016-05-02 MED ORDER — SIMVASTATIN 40 MG PO TABS: 40.0000 mg | ORAL_TABLET | Freq: Every evening | ORAL | 0 refills | Status: AC

## 2016-05-02 MED ORDER — HYDROCHLOROTHIAZIDE 12.5 MG PO CAPS: 12.5000 mg | ORAL_CAPSULE | Freq: Every day | ORAL | 0 refills | Status: AC

## 2016-05-02 NOTE — Telephone Encounter (Signed)
hydrochlorothiazide (MICROZIDE) 12.5 MG capsule levothyroxine (SYNTHROID, LEVOTHROID) 75 MCG tablet simvastatin (ZOCOR) 40 MG tablet [facility]  Patient out of the hydrochlorothiazide

## 2016-05-11 ENCOUNTER — Other Ambulatory Visit: Payer: Self-pay

## 2016-05-11 DIAGNOSIS — I10 Essential (primary) hypertension: Secondary | ICD-10-CM

## 2016-05-11 MED ORDER — METOPROLOL TARTRATE 25 MG PO TABS: 12.5000 mg | ORAL_TABLET | Freq: Two times a day (BID) | ORAL | 1 refills | Status: AC

## 2016-05-11 MED ORDER — LINAGLIPTIN 5 MG PO TABS: 5.0000 mg | ORAL_TABLET | Freq: Every day | ORAL | 1 refills | Status: AC

## 2016-05-11 MED ORDER — AMLODIPINE BESYLATE 10 MG PO TABS: 10.0000 mg | ORAL_TABLET | Freq: Every day | ORAL | 1 refills | Status: AC

## 2016-05-11 NOTE — Telephone Encounter (Signed)
amLODipine (NORVASC) 10 MG tablet linagliptin (TRADJENTA) 5 MG TABS tablet metoprolol tartrate (LOPRESSOR) 25 MG tablet, refill request.

## 2016-05-13 NOTE — Progress Notes (Signed)
Case discussed with Dr. Patel at the time of the visit.  We reviewed the resident's history and exam and pertinent patient test results.  I agree with the assessment, diagnosis, and plan of care documented in the resident's note. 

## 2016-05-21 ENCOUNTER — Other Ambulatory Visit: Payer: Self-pay | Admitting: Internal Medicine

## 2016-05-21 NOTE — Telephone Encounter (Signed)
clopidogrel (PLAVIX) 75 MG tablet And test strips [facility]

## 2016-05-22 ENCOUNTER — Other Ambulatory Visit: Payer: Self-pay | Admitting: Internal Medicine

## 2016-05-22 DIAGNOSIS — Z794 Long term (current) use of insulin: Principal | ICD-10-CM

## 2016-05-22 DIAGNOSIS — E1100 Type 2 diabetes mellitus with hyperosmolarity without nonketotic hyperglycemic-hyperosmolar coma (NKHHC): Secondary | ICD-10-CM

## 2016-05-22 MED ORDER — GLUCOSE BLOOD VI STRP: ORAL_STRIP | 12 refills | Status: AC

## 2016-05-22 MED ORDER — CLOPIDOGREL BISULFATE 75 MG PO TABS: 75.0000 mg | ORAL_TABLET | Freq: Every day | ORAL | 1 refills | Status: AC

## 2016-05-25 ENCOUNTER — Encounter: Payer: Self-pay | Admitting: Internal Medicine

## 2016-05-25 ENCOUNTER — Ambulatory Visit (INDEPENDENT_AMBULATORY_CARE_PROVIDER_SITE_OTHER): Payer: Medicare Other | Admitting: Internal Medicine

## 2016-05-25 VITALS — BP 142/78 | HR 86 | Temp 98.6°F | Wt 271.1 lb

## 2016-05-25 DIAGNOSIS — F419 Anxiety disorder, unspecified: Secondary | ICD-10-CM

## 2016-05-25 DIAGNOSIS — R0989 Other specified symptoms and signs involving the circulatory and respiratory systems: Secondary | ICD-10-CM

## 2016-05-25 DIAGNOSIS — Z23 Encounter for immunization: Secondary | ICD-10-CM

## 2016-05-25 DIAGNOSIS — Z794 Long term (current) use of insulin: Secondary | ICD-10-CM

## 2016-05-25 DIAGNOSIS — R05 Cough: Secondary | ICD-10-CM | POA: Diagnosis not present

## 2016-05-25 DIAGNOSIS — R059 Cough, unspecified: Secondary | ICD-10-CM

## 2016-05-25 DIAGNOSIS — Z87891 Personal history of nicotine dependence: Secondary | ICD-10-CM

## 2016-05-25 DIAGNOSIS — E1165 Type 2 diabetes mellitus with hyperglycemia: Secondary | ICD-10-CM

## 2016-05-25 DIAGNOSIS — E1100 Type 2 diabetes mellitus with hyperosmolarity without nonketotic hyperglycemic-hyperosmolar coma (NKHHC): Secondary | ICD-10-CM

## 2016-05-25 DIAGNOSIS — Z8673 Personal history of transient ischemic attack (TIA), and cerebral infarction without residual deficits: Secondary | ICD-10-CM

## 2016-05-25 LAB — GLUCOSE, CAPILLARY: Glucose-Capillary: 416 mg/dL — ABNORMAL HIGH (ref 65–99)

## 2016-05-25 LAB — POCT GLYCOSYLATED HEMOGLOBIN (HGB A1C): HEMOGLOBIN A1C: 12.3

## 2016-05-25 MED ORDER — OMEPRAZOLE MAGNESIUM 20 MG PO TBEC: 20.0000 mg | DELAYED_RELEASE_TABLET | Freq: Every day | ORAL | 1 refills | Status: DC

## 2016-05-25 MED ORDER — INSULIN GLARGINE 100 UNIT/ML SOLOSTAR PEN: 55.0000 [IU] | PEN_INJECTOR | Freq: Every day | SUBCUTANEOUS | 11 refills | Status: AC

## 2016-05-25 NOTE — Assessment & Plan Note (Signed)
Assessment He would like some medication for his "nerves." He has struggled with anxiety for years and used to see multiple mental health providers who prescribed several different agents without much relief except for alprazolam  Plan -Explained to the patient that initiation of any anxiolytic therapy, like alprazolam, would require discussion with his PCP per revised [facility] policy which would be ideal at his next follow-up visit -Explained that short-term anxiolytics, like benzodiazepines, work best when combined with long-term therapies, like an SSRI, after which short-term therapies would be discontinued once a maintenance dose is achieved of the long-term therapy -Recommend screening questionnaire to establish a baseline for his anxiety before initiating any therapy to best assess effectiveness over time as subjective report is often inadequate in titrating therapy

## 2016-05-25 NOTE — Patient Instructions (Addendum)
Please take Prilosec 1 tablet 30 minutes before you eat dinner. Let's see what happens to your cough.  For your sugars, make sure to avoid late night snacks, so your blood sugar gets better. If you are still running high, you can go up by 5 units of your Lantus.  Please see Dr. Juleen China next month.

## 2016-05-25 NOTE — Assessment & Plan Note (Addendum)
Assessment A1c 11.9, 02/23/16. 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  At his last office visit 04/24/16, mealtime insulin [NovoLog 10 units with dinner] was added given persistent elevations in postprandial sugars in the evening. He was to continue his other medications: Glargine 50 units at bedtime, Tradjenta 5 mg daily, metformin 1000 g twice daily.   Review his blood sugar log today is notable for most values trending in the 200s though there is only 1-2 values per day. His fasting blood sugars are consistently in the 200s with outliers 120, 309. He eats twice daily, breakfast and dinner, and a late night snack which I suspect is affecting his fasting blood sugars. His daughter has very good insight into his disease and ensures he takes his medications consistently.  Plan -Recheck A1c today -Recommended he stop eating late night snacks, and if his blood sugars still not at 120-150 the morning, he needs to increase bedtime Lantus to 55 units [10% increase from 50 units] -Continue Tradjenta 5 mg daily, metformin 1000 mg twice daily -Administered flu vaccine today  ADDENDUM 05/25/2016  11:49 AM:  A1c 12.3, mildly worse from 11.9 at last check. I will go ahead to recommend increasing Lantus to 55 units and avoiding late night snacks. I confirmed instructions with daughter in the waiting room, and she declined an updated after visit summary.

## 2016-05-25 NOTE — Assessment & Plan Note (Signed)
Assessment Since his last office visit, he is complained of acute onset of productive cough and runny nose. He notices it most particularly at night and is at worst upon awakening. He sleeps in an apartment and denies any dust or pets at home. He sleeps 4 hours a night and feels rested upon awakening. He does not have a history of lung disease, and most recent chest x-ray 04/12/16 corroborates that. He also denies a history of allergies. He quit smoking 25 years ago though continues to dip tobacco for the last several years. Otherwise, he denies any sick contacts, fevers, wheezing, dyspepsia.  Given the time course and presentation, I suspect silent GERD as the most likely etiology. Postnasal drip would be my second consideration though he denies a prior history of allergies. He is also on an ACE inhibitor which is not typically associated with runny nose though has been known to cause dry cough.  Plan -Reviewed with him the challenge of diagnosing subacute cough as it warranted therapeutic trials of different medications to best elucidate the etiology to which he expressed understanding -Prescribed omeprazole 20 mg to be taken 30 minutes before dinner. There is a cross reaction with clopidogrel which I suspect is from through acid inhibition and decrease absorption though his daughter clarified to me that he takes his medication the morning. -Follow-up in one month with PCP to reassess
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
## 2016-05-25 NOTE — Assessment & Plan Note (Deleted)
Assessment He would like some medication for his "nerves." He has struggled with anxiety for years and used to see multiple mental health providers who prescribed several different agents without much relief except for alprazolam  Plan -Explained to the patient that initiation of any anxiolytic therapy, like alprazolam, would require discussion with his PCP per revised [facility] policy which would be ideal at his next follow-up visit -Explained that short-term anxiolytics, like benzodiazepines, work best when combined with long-term therapies, like an SSRI, after which short-term therapies would be discontinued once a maintenance dose is achieved of the long-term therapy -Recommend screening questionnaire to establish a baseline for his anxiety before initiating any therapy to best assess effectiveness over time as subjective report is often inadequate in titrating therapy

## 2016-05-29 NOTE — Progress Notes (Signed)
Internal Medicine Clinic Attending  Case discussed with Dr. Patel,Rushil soon after the resident saw the patient.  We reviewed the resident's history and exam and pertinent patient test results.  I agree with the assessment, diagnosis, and plan of care documented in the resident's note. 

## 2016-06-04 ENCOUNTER — Encounter: Payer: Medicare Other | Attending: Physical Medicine & Rehabilitation

## 2016-06-04 ENCOUNTER — Encounter: Payer: Self-pay | Admitting: Physical Medicine & Rehabilitation

## 2016-06-04 ENCOUNTER — Ambulatory Visit (HOSPITAL_BASED_OUTPATIENT_CLINIC_OR_DEPARTMENT_OTHER): Payer: Medicare Other | Admitting: Physical Medicine & Rehabilitation

## 2016-06-04 VITALS — BP 136/92 | HR 79 | Resp 14

## 2016-06-04 DIAGNOSIS — M1990 Unspecified osteoarthritis, unspecified site: Secondary | ICD-10-CM | POA: Diagnosis not present

## 2016-06-04 DIAGNOSIS — I2510 Atherosclerotic heart disease of native coronary artery without angina pectoris: Secondary | ICD-10-CM | POA: Insufficient documentation

## 2016-06-04 DIAGNOSIS — R490 Dysphonia: Secondary | ICD-10-CM | POA: Diagnosis not present

## 2016-06-04 DIAGNOSIS — E782 Mixed hyperlipidemia: Secondary | ICD-10-CM | POA: Diagnosis not present

## 2016-06-04 DIAGNOSIS — I69398 Other sequelae of cerebral infarction: Secondary | ICD-10-CM | POA: Diagnosis not present

## 2016-06-04 DIAGNOSIS — I252 Old myocardial infarction: Secondary | ICD-10-CM | POA: Diagnosis not present

## 2016-06-04 DIAGNOSIS — E119 Type 2 diabetes mellitus without complications: Secondary | ICD-10-CM | POA: Diagnosis not present

## 2016-06-04 DIAGNOSIS — Z5189 Encounter for other specified aftercare: Secondary | ICD-10-CM | POA: Insufficient documentation

## 2016-06-04 DIAGNOSIS — I69393 Ataxia following cerebral infarction: Secondary | ICD-10-CM

## 2016-06-04 DIAGNOSIS — G463 Brain stem stroke syndrome: Secondary | ICD-10-CM

## 2016-06-04 DIAGNOSIS — I10 Essential (primary) hypertension: Secondary | ICD-10-CM | POA: Diagnosis not present

## 2016-06-04 DIAGNOSIS — F329 Major depressive disorder, single episode, unspecified: Secondary | ICD-10-CM | POA: Diagnosis not present

## 2016-06-04 DIAGNOSIS — F419 Anxiety disorder, unspecified: Secondary | ICD-10-CM | POA: Insufficient documentation

## 2016-06-04 DIAGNOSIS — Z85038 Personal history of other malignant neoplasm of large intestine: Secondary | ICD-10-CM | POA: Insufficient documentation

## 2016-06-04 DIAGNOSIS — F319 Bipolar disorder, unspecified: Secondary | ICD-10-CM | POA: Diagnosis not present
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
## 2016-06-25 ENCOUNTER — Encounter (HOSPITAL_COMMUNITY): Payer: Self-pay | Admitting: Emergency Medicine

## 2016-06-25 ENCOUNTER — Emergency Department (HOSPITAL_COMMUNITY)
Admission: EM | Admit: 2016-06-25 | Discharge: 2016-06-25 | Disposition: A | Discharge status: Home / Self Care | Payer: Medicare Other | Source: Home / Self Care | Attending: Emergency Medicine | Admitting: Emergency Medicine

## 2016-06-25 ENCOUNTER — Emergency Department (HOSPITAL_COMMUNITY): Payer: Medicare Other

## 2016-06-25 DIAGNOSIS — N179 Acute kidney failure, unspecified: Secondary | ICD-10-CM | POA: Diagnosis not present

## 2016-06-25 DIAGNOSIS — Z79899 Other long term (current) drug therapy: Secondary | ICD-10-CM

## 2016-06-25 DIAGNOSIS — Z7902 Long term (current) use of antithrombotics/antiplatelets: Secondary | ICD-10-CM | POA: Diagnosis not present

## 2016-06-25 DIAGNOSIS — M6282 Rhabdomyolysis: Secondary | ICD-10-CM | POA: Diagnosis not present

## 2016-06-25 DIAGNOSIS — E872 Acidosis: Secondary | ICD-10-CM | POA: Diagnosis not present

## 2016-06-25 DIAGNOSIS — I10 Essential (primary) hypertension: Secondary | ICD-10-CM | POA: Insufficient documentation

## 2016-06-25 DIAGNOSIS — M5136 Other intervertebral disc degeneration, lumbar region: Secondary | ICD-10-CM | POA: Diagnosis not present

## 2016-06-25 DIAGNOSIS — Z8673 Personal history of transient ischemic attack (TIA), and cerebral infarction without residual deficits: Secondary | ICD-10-CM | POA: Diagnosis not present

## 2016-06-25 DIAGNOSIS — M5126 Other intervertebral disc displacement, lumbar region: Secondary | ICD-10-CM | POA: Diagnosis not present

## 2016-06-25 DIAGNOSIS — Z9861 Coronary angioplasty status: Secondary | ICD-10-CM | POA: Diagnosis not present

## 2016-06-25 DIAGNOSIS — M4804 Spinal stenosis, thoracic region: Secondary | ICD-10-CM | POA: Diagnosis not present

## 2016-06-25 DIAGNOSIS — G4733 Obstructive sleep apnea (adult) (pediatric): Secondary | ICD-10-CM | POA: Diagnosis not present

## 2016-06-25 DIAGNOSIS — E119 Type 2 diabetes mellitus without complications: Secondary | ICD-10-CM | POA: Insufficient documentation

## 2016-06-25 DIAGNOSIS — E875 Hyperkalemia: Secondary | ICD-10-CM | POA: Diagnosis not present

## 2016-06-25 DIAGNOSIS — I2510 Atherosclerotic heart disease of native coronary artery without angina pectoris: Secondary | ICD-10-CM | POA: Insufficient documentation

## 2016-06-25 DIAGNOSIS — R531 Weakness: Secondary | ICD-10-CM | POA: Diagnosis not present

## 2016-06-25 DIAGNOSIS — M5127 Other intervertebral disc displacement, lumbosacral region: Secondary | ICD-10-CM

## 2016-06-25 DIAGNOSIS — A419 Sepsis, unspecified organism: Secondary | ICD-10-CM | POA: Diagnosis not present

## 2016-06-25 DIAGNOSIS — M6009 Infective myositis, multiple sites: Secondary | ICD-10-CM | POA: Diagnosis not present

## 2016-06-25 DIAGNOSIS — Z800 Family history of malignant neoplasm of digestive organs: Secondary | ICD-10-CM | POA: Diagnosis not present

## 2016-06-25 DIAGNOSIS — E782 Mixed hyperlipidemia: Secondary | ICD-10-CM | POA: Diagnosis not present

## 2016-06-25 DIAGNOSIS — Z85038 Personal history of other malignant neoplasm of large intestine: Secondary | ICD-10-CM | POA: Insufficient documentation

## 2016-06-25 DIAGNOSIS — N390 Urinary tract infection, site not specified: Secondary | ICD-10-CM | POA: Diagnosis not present

## 2016-06-25 DIAGNOSIS — M609 Myositis, unspecified: Secondary | ICD-10-CM | POA: Diagnosis not present

## 2016-06-25 DIAGNOSIS — E039 Hypothyroidism, unspecified: Secondary | ICD-10-CM | POA: Insufficient documentation

## 2016-06-25 DIAGNOSIS — F17220 Nicotine dependence, chewing tobacco, uncomplicated: Secondary | ICD-10-CM

## 2016-06-25 DIAGNOSIS — E1151 Type 2 diabetes mellitus with diabetic peripheral angiopathy without gangrene: Secondary | ICD-10-CM | POA: Diagnosis not present

## 2016-06-25 DIAGNOSIS — Z794 Long term (current) use of insulin: Secondary | ICD-10-CM | POA: Insufficient documentation

## 2016-06-25 DIAGNOSIS — R109 Unspecified abdominal pain: Secondary | ICD-10-CM | POA: Diagnosis not present

## 2016-06-25 DIAGNOSIS — I252 Old myocardial infarction: Secondary | ICD-10-CM

## 2016-06-25 DIAGNOSIS — E860 Dehydration: Secondary | ICD-10-CM | POA: Diagnosis not present

## 2016-06-25 LAB — CBG MONITORING, ED: GLUCOSE-CAPILLARY: 280 mg/dL — AB (ref 65–99)

## 2016-06-25 MED ORDER — HYDROMORPHONE HCL 1 MG/ML IJ SOLN
1.0000 mg | Freq: Once | INTRAMUSCULAR | Status: AC
  Administered 2016-06-25: 1 mg via INTRAVENOUS
  Filled 2016-06-25: qty 1

## 2016-06-25 MED ORDER — PREDNISONE 50 MG PO TABS: ORAL_TABLET | ORAL | 0 refills | Status: AC

## 2016-06-25 MED ORDER — SODIUM CHLORIDE 0.9 % IV BOLUS (SEPSIS)
500.0000 mL | Freq: Once | INTRAVENOUS | Status: AC
  Administered 2016-06-25: 500 mL via INTRAVENOUS

## 2016-06-25 MED ORDER — ONDANSETRON HCL 4 MG/2ML IJ SOLN
4.0000 mg | Freq: Once | INTRAMUSCULAR | Status: AC
  Administered 2016-06-25: 4 mg via INTRAVENOUS
  Filled 2016-06-25: qty 2

## 2016-06-25 MED ORDER — OXYCODONE-ACETAMINOPHEN 5-325 MG PO TABS: 1.0000 | ORAL_TABLET | ORAL | 0 refills | Status: AC | PRN

## 2016-06-25 MED ORDER — NAPROXEN 500 MG PO TABS: 500.0000 mg | ORAL_TABLET | Freq: Two times a day (BID) | ORAL | 0 refills | Status: DC

## 2016-06-25 MED ORDER — KETOROLAC TROMETHAMINE 30 MG/ML IJ SOLN
30.0000 mg | Freq: Once | INTRAMUSCULAR | Status: AC
Start: 2016-06-25 — End: 2016-06-25
  Administered 2016-06-25: 30 mg via INTRAVENOUS
  Filled 2016-06-25: qty 1

## 2016-06-25 NOTE — ED Triage Notes (Signed)
Patient complaining of right flank pain radiating down right leg x 3 days. Patient sweating profusely at triage and complaining of left arm numbness and tingling x 3 days also.
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
## 2016-06-25 NOTE — Discharge Instructions (Signed)
Medication for pain, inflammation, prednisone. Ice pack to lower back. See [name] tomorrow

## 2016-06-26 ENCOUNTER — Encounter (HOSPITAL_COMMUNITY): Payer: Self-pay | Admitting: Internal Medicine

## 2016-06-26 ENCOUNTER — Telehealth: Payer: Self-pay | Admitting: *Deleted

## 2016-06-26 ENCOUNTER — Inpatient Hospital Stay (HOSPITAL_COMMUNITY)
Admission: AD | Admit: 2016-06-26 | Discharge: 2016-07-16 | DRG: 557 | Disposition: E | Payer: Medicare Other | Source: Ambulatory Visit | Attending: Internal Medicine | Admitting: Internal Medicine

## 2016-06-26 ENCOUNTER — Other Ambulatory Visit: Payer: Self-pay | Admitting: Surgical

## 2016-06-26 DIAGNOSIS — Z79899 Other long term (current) drug therapy: Secondary | ICD-10-CM

## 2016-06-26 DIAGNOSIS — E039 Hypothyroidism, unspecified: Secondary | ICD-10-CM | POA: Diagnosis present

## 2016-06-26 DIAGNOSIS — Z91041 Radiographic dye allergy status: Secondary | ICD-10-CM

## 2016-06-26 DIAGNOSIS — W19XXXA Unspecified fall, initial encounter: Secondary | ICD-10-CM | POA: Diagnosis present

## 2016-06-26 DIAGNOSIS — Z79891 Long term (current) use of opiate analgesic: Secondary | ICD-10-CM | POA: Diagnosis not present

## 2016-06-26 DIAGNOSIS — I2510 Atherosclerotic heart disease of native coronary artery without angina pectoris: Secondary | ICD-10-CM | POA: Diagnosis present

## 2016-06-26 DIAGNOSIS — Z800 Family history of malignant neoplasm of digestive organs: Secondary | ICD-10-CM | POA: Diagnosis not present

## 2016-06-26 DIAGNOSIS — G894 Chronic pain syndrome: Secondary | ICD-10-CM | POA: Diagnosis not present

## 2016-06-26 DIAGNOSIS — A419 Sepsis, unspecified organism: Secondary | ICD-10-CM | POA: Diagnosis not present

## 2016-06-26 DIAGNOSIS — M542 Cervicalgia: Secondary | ICD-10-CM | POA: Diagnosis not present

## 2016-06-26 DIAGNOSIS — M6282 Rhabdomyolysis: Principal | ICD-10-CM | POA: Diagnosis present

## 2016-06-26 DIAGNOSIS — E1129 Type 2 diabetes mellitus with other diabetic kidney complication: Secondary | ICD-10-CM | POA: Diagnosis not present

## 2016-06-26 DIAGNOSIS — M6009 Infective myositis, multiple sites: Secondary | ICD-10-CM | POA: Diagnosis present

## 2016-06-26 DIAGNOSIS — F319 Bipolar disorder, unspecified: Secondary | ICD-10-CM | POA: Diagnosis present

## 2016-06-26 DIAGNOSIS — Y92009 Unspecified place in unspecified non-institutional (private) residence as the place of occurrence of the external cause: Secondary | ICD-10-CM | POA: Diagnosis not present

## 2016-06-26 DIAGNOSIS — Z888 Allergy status to other drugs, medicaments and biological substances status: Secondary | ICD-10-CM

## 2016-06-26 DIAGNOSIS — M609 Myositis, unspecified: Secondary | ICD-10-CM | POA: Diagnosis present

## 2016-06-26 DIAGNOSIS — I10 Essential (primary) hypertension: Secondary | ICD-10-CM | POA: Diagnosis not present

## 2016-06-26 DIAGNOSIS — M4804 Spinal stenosis, thoracic region: Secondary | ICD-10-CM | POA: Diagnosis present

## 2016-06-26 DIAGNOSIS — N390 Urinary tract infection, site not specified: Secondary | ICD-10-CM | POA: Diagnosis not present

## 2016-06-26 DIAGNOSIS — Z87891 Personal history of nicotine dependence: Secondary | ICD-10-CM

## 2016-06-26 DIAGNOSIS — M5136 Other intervertebral disc degeneration, lumbar region: Secondary | ICD-10-CM | POA: Diagnosis not present

## 2016-06-26 DIAGNOSIS — N179 Acute kidney failure, unspecified: Secondary | ICD-10-CM | POA: Diagnosis not present

## 2016-06-26 DIAGNOSIS — M549 Dorsalgia, unspecified: Secondary | ICD-10-CM

## 2016-06-26 DIAGNOSIS — R34 Anuria and oliguria: Secondary | ICD-10-CM | POA: Diagnosis present

## 2016-06-26 DIAGNOSIS — M47814 Spondylosis without myelopathy or radiculopathy, thoracic region: Secondary | ICD-10-CM | POA: Diagnosis not present

## 2016-06-26 DIAGNOSIS — E875 Hyperkalemia: Secondary | ICD-10-CM | POA: Diagnosis not present

## 2016-06-26 DIAGNOSIS — E1151 Type 2 diabetes mellitus with diabetic peripheral angiopathy without gangrene: Secondary | ICD-10-CM | POA: Diagnosis present

## 2016-06-26 DIAGNOSIS — E1100 Type 2 diabetes mellitus with hyperosmolarity without nonketotic hyperglycemic-hyperosmolar coma (NKHHC): Secondary | ICD-10-CM | POA: Diagnosis not present

## 2016-06-26 DIAGNOSIS — I252 Old myocardial infarction: Secondary | ICD-10-CM

## 2016-06-26 DIAGNOSIS — M6281 Muscle weakness (generalized): Secondary | ICD-10-CM | POA: Diagnosis not present

## 2016-06-26 DIAGNOSIS — E782 Mixed hyperlipidemia: Secondary | ICD-10-CM | POA: Diagnosis present

## 2016-06-26 DIAGNOSIS — Z6841 Body Mass Index (BMI) 40.0 and over, adult: Secondary | ICD-10-CM | POA: Diagnosis not present

## 2016-06-26 DIAGNOSIS — Z833 Family history of diabetes mellitus: Secondary | ICD-10-CM

## 2016-06-26 DIAGNOSIS — Z9841 Cataract extraction status, right eye: Secondary | ICD-10-CM

## 2016-06-26 DIAGNOSIS — M5126 Other intervertebral disc displacement, lumbar region: Secondary | ICD-10-CM | POA: Diagnosis present

## 2016-06-26 DIAGNOSIS — N19 Unspecified kidney failure: Secondary | ICD-10-CM | POA: Diagnosis not present

## 2016-06-26 DIAGNOSIS — E119 Type 2 diabetes mellitus without complications: Secondary | ICD-10-CM

## 2016-06-26 DIAGNOSIS — M545 Low back pain, unspecified: Secondary | ICD-10-CM

## 2016-06-26 DIAGNOSIS — R0602 Shortness of breath: Secondary | ICD-10-CM | POA: Diagnosis not present

## 2016-06-26 DIAGNOSIS — R29898 Other symptoms and signs involving the musculoskeletal system: Secondary | ICD-10-CM

## 2016-06-26 DIAGNOSIS — Z8673 Personal history of transient ischemic attack (TIA), and cerebral infarction without residual deficits: Secondary | ICD-10-CM | POA: Diagnosis not present

## 2016-06-26 DIAGNOSIS — E872 Acidosis: Secondary | ICD-10-CM | POA: Diagnosis present

## 2016-06-26 DIAGNOSIS — Z794 Long term (current) use of insulin: Secondary | ICD-10-CM

## 2016-06-26 DIAGNOSIS — Z9049 Acquired absence of other specified parts of digestive tract: Secondary | ICD-10-CM

## 2016-06-26 DIAGNOSIS — G4733 Obstructive sleep apnea (adult) (pediatric): Secondary | ICD-10-CM | POA: Diagnosis present

## 2016-06-26 DIAGNOSIS — M5116 Intervertebral disc disorders with radiculopathy, lumbar region: Secondary | ICD-10-CM | POA: Diagnosis present

## 2016-06-26 DIAGNOSIS — Z9861 Coronary angioplasty status: Secondary | ICD-10-CM

## 2016-06-26 DIAGNOSIS — Z7902 Long term (current) use of antithrombotics/antiplatelets: Secondary | ICD-10-CM | POA: Diagnosis not present

## 2016-06-26 DIAGNOSIS — E860 Dehydration: Secondary | ICD-10-CM | POA: Diagnosis present

## 2016-06-26 DIAGNOSIS — Z85048 Personal history of other malignant neoplasm of rectum, rectosigmoid junction, and anus: Secondary | ICD-10-CM

## 2016-06-26 DIAGNOSIS — M5127 Other intervertebral disc displacement, lumbosacral region: Secondary | ICD-10-CM | POA: Diagnosis present

## 2016-06-26 DIAGNOSIS — Z8249 Family history of ischemic heart disease and other diseases of the circulatory system: Secondary | ICD-10-CM

## 2016-06-26 DIAGNOSIS — Z961 Presence of intraocular lens: Secondary | ICD-10-CM | POA: Diagnosis present

## 2016-06-26 LAB — SURGICAL PCR SCREEN
MRSA, PCR: NEGATIVE
Staphylococcus aureus: NEGATIVE

## 2016-06-26 LAB — GLUCOSE, CAPILLARY: Glucose-Capillary: 186 mg/dL — ABNORMAL HIGH (ref 65–99)

## 2016-06-26 MED ORDER — SODIUM CHLORIDE 0.9% FLUSH: 3.0000 mL | Freq: Two times a day (BID) | INTRAVENOUS | Status: DC

## 2016-06-26 MED ORDER — ONDANSETRON HCL 4 MG PO TABS: 4.0000 mg | ORAL_TABLET | Freq: Four times a day (QID) | ORAL | Status: DC | PRN

## 2016-06-26 MED ORDER — ACETAMINOPHEN 650 MG RE SUPP: 650.0000 mg | Freq: Four times a day (QID) | RECTAL | Status: DC | PRN

## 2016-06-26 MED ORDER — SODIUM CHLORIDE 0.9 % IV SOLN
INTRAVENOUS | Status: DC
  Administered 2016-06-26 – 2016-06-28 (×4): via INTRAVENOUS

## 2016-06-26 MED ORDER — ONDANSETRON HCL 4 MG/2ML IJ SOLN: 4.0000 mg | Freq: Four times a day (QID) | INTRAMUSCULAR | Status: DC | PRN

## 2016-06-26 MED ORDER — LISINOPRIL 20 MG PO TABS
40.0000 mg | ORAL_TABLET | Freq: Every day | ORAL | Status: DC
  Administered 2016-06-27: 40 mg via ORAL
  Filled 2016-06-26: qty 2

## 2016-06-26 MED ORDER — INSULIN ASPART 100 UNIT/ML ~~LOC~~ SOLN
0.0000 [IU] | Freq: Three times a day (TID) | SUBCUTANEOUS | Status: DC
  Administered 2016-06-27 – 2016-06-28 (×3): 4 [IU] via SUBCUTANEOUS
  Administered 2016-06-28: 7 [IU] via SUBCUTANEOUS
  Administered 2016-06-29 (×2): 3 [IU] via SUBCUTANEOUS
  Administered 2016-06-30: 4 [IU] via SUBCUTANEOUS
  Administered 2016-06-30: 11 [IU] via SUBCUTANEOUS
  Administered 2016-07-01: 7 [IU] via SUBCUTANEOUS
  Administered 2016-07-01: 3 [IU] via SUBCUTANEOUS
  Administered 2016-07-02 (×2): 4 [IU] via SUBCUTANEOUS
  Administered 2016-07-03: 3 [IU] via SUBCUTANEOUS
  Administered 2016-07-03 (×2): 7 [IU] via SUBCUTANEOUS

## 2016-06-26 MED ORDER — DOCUSATE SODIUM 100 MG PO CAPS
100.0000 mg | ORAL_CAPSULE | Freq: Two times a day (BID) | ORAL | Status: DC
  Administered 2016-06-26: 100 mg via ORAL

## 2016-06-26 MED ORDER — INSULIN LISPRO 100 UNIT/ML (KWIKPEN): 10.0000 [IU] | PEN_INJECTOR | Freq: Every day | SUBCUTANEOUS | Status: DC

## 2016-06-26 MED ORDER — INSULIN ASPART 100 UNIT/ML ~~LOC~~ SOLN
4.0000 [IU] | Freq: Three times a day (TID) | SUBCUTANEOUS | Status: DC
  Administered 2016-06-27 (×2): 4 [IU] via SUBCUTANEOUS

## 2016-06-26 MED ORDER — LINAGLIPTIN 5 MG PO TABS
5.0000 mg | ORAL_TABLET | Freq: Every day | ORAL | Status: DC
  Administered 2016-06-27: 5 mg via ORAL
  Filled 2016-06-26: qty 1

## 2016-06-26 MED ORDER — OXYCODONE HCL 5 MG PO TABS
5.0000 mg | ORAL_TABLET | ORAL | Status: DC | PRN
  Administered 2016-06-27 – 2016-06-30 (×4): 10 mg via ORAL
  Administered 2016-06-30 (×2): 5 mg via ORAL
  Administered 2016-07-01 (×2): 10 mg via ORAL
  Filled 2016-06-26 (×4): qty 2
  Filled 2016-06-26: qty 1
  Filled 2016-06-26 (×3): qty 2

## 2016-06-26 MED ORDER — AMLODIPINE BESYLATE 5 MG PO TABS
5.0000 mg | ORAL_TABLET | Freq: Every day | ORAL | Status: DC
  Administered 2016-06-27 – 2016-06-28 (×2): 5 mg via ORAL
  Filled 2016-06-26 (×2): qty 1

## 2016-06-26 MED ORDER — GABAPENTIN 300 MG PO CAPS
300.0000 mg | ORAL_CAPSULE | Freq: Every day | ORAL | Status: DC
  Administered 2016-06-26 – 2016-06-27 (×2): 300 mg via ORAL
  Filled 2016-06-26 (×2): qty 1

## 2016-06-26 MED ORDER — BISACODYL 10 MG RE SUPP: 10.0000 mg | Freq: Every day | RECTAL | Status: DC | PRN

## 2016-06-26 MED ORDER — POLYETHYLENE GLYCOL 3350 17 G PO PACK
17.0000 g | PACK | Freq: Every day | ORAL | Status: DC | PRN
Start: 2016-06-26 — End: 2016-06-26

## 2016-06-26 MED ORDER — POLYETHYLENE GLYCOL 3350 17 G PO PACK
17.0000 g | PACK | Freq: Every day | ORAL | Status: DC
  Administered 2016-06-27: 17 g via ORAL
  Filled 2016-06-26: qty 1

## 2016-06-26 MED ORDER — HYDROMORPHONE HCL 1 MG/ML IJ SOLN: 0.5000 mg | INTRAMUSCULAR | Status: DC | PRN

## 2016-06-26 MED ORDER — METFORMIN HCL 500 MG PO TABS
1000.0000 mg | ORAL_TABLET | Freq: Two times a day (BID) | ORAL | Status: DC
  Administered 2016-06-27: 1000 mg via ORAL
  Filled 2016-06-26: qty 2

## 2016-06-26 MED ORDER — INSULIN ASPART 100 UNIT/ML ~~LOC~~ SOLN: 0.0000 [IU] | Freq: Every day | SUBCUTANEOUS | Status: DC

## 2016-06-26 MED ORDER — ACETAMINOPHEN 325 MG PO TABS: 650.0000 mg | ORAL_TABLET | Freq: Four times a day (QID) | ORAL | Status: DC | PRN

## 2016-06-26 MED ORDER — METOPROLOL TARTRATE 12.5 MG HALF TABLET: 12.5000 mg | ORAL_TABLET | Freq: Two times a day (BID) | ORAL | Status: DC

## 2016-06-26 MED ORDER — INSULIN DETEMIR 100 UNIT/ML ~~LOC~~ SOLN
20.0000 [IU] | Freq: Two times a day (BID) | SUBCUTANEOUS | Status: DC
  Administered 2016-06-26 – 2016-06-28 (×4): 20 [IU] via SUBCUTANEOUS
  Filled 2016-06-26 (×5): qty 0.2

## 2016-06-26 MED ORDER — HYDROCHLOROTHIAZIDE 12.5 MG PO CAPS
12.5000 mg | ORAL_CAPSULE | Freq: Every day | ORAL | Status: DC
  Administered 2016-06-26 – 2016-06-27 (×2): 12.5 mg via ORAL
  Filled 2016-06-26 (×2): qty 1

## 2016-06-26 MED ORDER — AMLODIPINE BESYLATE 10 MG PO TABS: 10.0000 mg | ORAL_TABLET | Freq: Every day | ORAL | Status: DC

## 2016-06-26 MED ORDER — SODIUM CHLORIDE 0.9 % IV SOLN: 250.0000 mL | INTRAVENOUS | Status: DC | PRN

## 2016-06-26 MED ORDER — HYDROCHLOROTHIAZIDE 12.5 MG PO CAPS: 12.5000 mg | ORAL_CAPSULE | Freq: Every day | ORAL | Status: DC

## 2016-06-26 MED ORDER — FLEET ENEMA 7-19 GM/118ML RE ENEM: 1.0000 | ENEMA | Freq: Once | RECTAL | Status: DC | PRN

## 2016-06-26 MED ORDER — SODIUM CHLORIDE 0.9% FLUSH: 3.0000 mL | INTRAVENOUS | Status: DC | PRN

## 2016-06-26 MED ORDER — HYDROCODONE-ACETAMINOPHEN 5-325 MG PO TABS
1.0000 | ORAL_TABLET | Freq: Four times a day (QID) | ORAL | Status: DC | PRN
  Administered 2016-06-27 – 2016-07-02 (×4): 2 via ORAL
  Filled 2016-06-26 (×4): qty 2

## 2016-06-26 MED ORDER — OMEPRAZOLE MAGNESIUM 20 MG PO TBEC: 20.0000 mg | DELAYED_RELEASE_TABLET | Freq: Every day | ORAL | Status: DC

## 2016-06-26 MED ORDER — MORPHINE SULFATE (PF) 4 MG/ML IV SOLN: 4.0000 mg | INTRAVENOUS | Status: DC | PRN

## 2016-06-26 MED ORDER — METOPROLOL TARTRATE 12.5 MG HALF TABLET
12.5000 mg | ORAL_TABLET | Freq: Two times a day (BID) | ORAL | Status: DC
  Administered 2016-06-26 – 2016-07-03 (×15): 12.5 mg via ORAL
  Filled 2016-06-26 (×15): qty 1

## 2016-06-26 MED ORDER — LEVOTHYROXINE SODIUM 75 MCG PO TABS
75.0000 ug | ORAL_TABLET | Freq: Every day | ORAL | Status: DC
  Administered 2016-06-27 – 2016-06-28 (×2): 75 ug via ORAL
  Filled 2016-06-26 (×2): qty 1

## 2016-06-26 MED ORDER — SIMVASTATIN 40 MG PO TABS
40.0000 mg | ORAL_TABLET | Freq: Every evening | ORAL | Status: DC
  Administered 2016-06-26: 40 mg via ORAL
  Filled 2016-06-26: qty 1

## 2016-06-26 MED ORDER — INSULIN GLARGINE 100 UNIT/ML SOLOSTAR PEN: 55.0000 [IU] | PEN_INJECTOR | Freq: Every day | SUBCUTANEOUS | Status: DC

## 2016-06-26 NOTE — Telephone Encounter (Signed)
Plavix is for acute stroke in May 2017. It would be fine to switch plavix to aspirin 81mg  daily. We can make that change anytime. Would you please let their office and the patient know?
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
## 2016-06-26 NOTE — Progress Notes (Signed)
Pt refuse NIV for the night no distress or complications noted.

## 2016-06-26 NOTE — Telephone Encounter (Signed)
Talked to Dr Juleen China - stated "can hold Plavix for procedure".  Called Dr Charlestine Night office back - talked to Dr Rex Kras may hold Plavix per Dr Juleen China. Stated he will get Hospitalist to see pt when admitted.

## 2016-06-26 NOTE — Telephone Encounter (Signed)
Call from [place] at Dr Charlestine Night office. Stated when pt arrived/unable to walk; wants to do MRI of back and possible surgery. Planning on admitting pt to [facility]  Wants to know if pt can come off Plavix or be bridge? Office # [phone]

## 2016-06-27 ENCOUNTER — Inpatient Hospital Stay (HOSPITAL_COMMUNITY): Payer: Medicare Other

## 2016-06-27 DIAGNOSIS — R29898 Other symptoms and signs involving the musculoskeletal system: Secondary | ICD-10-CM

## 2016-06-27 DIAGNOSIS — M6282 Rhabdomyolysis: Secondary | ICD-10-CM | POA: Diagnosis present

## 2016-06-27 DIAGNOSIS — N179 Acute kidney failure, unspecified: Secondary | ICD-10-CM | POA: Diagnosis present

## 2016-06-27 LAB — CBC WITH DIFFERENTIAL/PLATELET
Basophils Absolute: 0 10*3/uL (ref 0.0–0.1)
Basophils Relative: 0 %
Eosinophils Absolute: 0.4 10*3/uL (ref 0.0–0.7)
Eosinophils Relative: 4 %
HCT: 36.5 % — ABNORMAL LOW (ref 39.0–52.0)
Hemoglobin: 13 g/dL (ref 13.0–17.0)
Lymphocytes Relative: 21 %
Lymphs Abs: 2.2 10*3/uL (ref 0.7–4.0)
MCH: 28.1 pg (ref 26.0–34.0)
MCHC: 35.6 g/dL (ref 30.0–36.0)
MCV: 78.8 fL (ref 78.0–100.0)
Monocytes Absolute: 0.6 10*3/uL (ref 0.1–1.0)
Monocytes Relative: 6 %
Neutro Abs: 7 10*3/uL (ref 1.7–7.7)
Neutrophils Relative %: 69 %
Platelets: 228 10*3/uL (ref 150–400)
RBC: 4.63 MIL/uL (ref 4.22–5.81)
RDW: 14.4 % (ref 11.5–15.5)
WBC: 10.2 10*3/uL (ref 4.0–10.5)

## 2016-06-27 LAB — URINALYSIS, ROUTINE W REFLEX MICROSCOPIC
Bilirubin Urine: NEGATIVE
Glucose, UA: 50 mg/dL — AB
Ketones, ur: NEGATIVE mg/dL
Leukocytes, UA: NEGATIVE
Nitrite: NEGATIVE
PROTEIN: 100 mg/dL — AB
Specific Gravity, Urine: 1.013 (ref 1.005–1.030)
pH: 5 (ref 5.0–8.0)

## 2016-06-27 LAB — BASIC METABOLIC PANEL
Anion gap: 10 (ref 5–15)
BUN: 46 mg/dL — AB (ref 6–20)
CALCIUM: 8.4 mg/dL — AB (ref 8.9–10.3)
CO2: 21 mmol/L — ABNORMAL LOW (ref 22–32)
CREATININE: 2.03 mg/dL — AB (ref 0.61–1.24)
Chloride: 106 mmol/L (ref 101–111)
GFR calc Af Amer: 38 mL/min — ABNORMAL LOW (ref >60)
GFR, EST NON AFRICAN AMERICAN: 33 mL/min — AB (ref >60)
GLUCOSE: 181 mg/dL — AB (ref 65–99)
Potassium: 5.3 mmol/L — ABNORMAL HIGH (ref 3.5–5.1)
SODIUM: 137 mmol/L (ref 135–145)

## 2016-06-27 LAB — CREATININE, URINE, RANDOM: Creatinine, Urine: 109.57 mg/dL

## 2016-06-27 LAB — PROTIME-INR
INR: 0.98
PROTHROMBIN TIME: 13 s (ref 11.4–15.2)

## 2016-06-27 LAB — GLUCOSE, CAPILLARY
Glucose-Capillary: 172 mg/dL — ABNORMAL HIGH (ref 65–99)
Glucose-Capillary: 120 mg/dL — ABNORMAL HIGH (ref 65–99)
Glucose-Capillary: 132 mg/dL — ABNORMAL HIGH (ref 65–99)

## 2016-06-27 LAB — SODIUM, URINE, RANDOM: SODIUM UR: 60 mmol/L

## 2016-06-27 LAB — CK: Total CK: >50000 U/L — ABNORMAL HIGH (ref 49–397)

## 2016-06-27 MED ORDER — HYDROMORPHONE HCL 2 MG/ML IJ SOLN
0.5000 mg | INTRAMUSCULAR | Status: DC | PRN
  Administered 2016-06-27: 1 mg via INTRAVENOUS
  Filled 2016-06-27: qty 1

## 2016-06-27 MED ORDER — ATORVASTATIN CALCIUM 10 MG PO TABS
20.0000 mg | ORAL_TABLET | Freq: Every day | ORAL | Status: DC
  Administered 2016-06-27: 20 mg via ORAL
  Filled 2016-06-27: qty 2

## 2016-06-27 MED ORDER — SENNOSIDES-DOCUSATE SODIUM 8.6-50 MG PO TABS
1.0000 | ORAL_TABLET | Freq: Two times a day (BID) | ORAL | Status: DC
  Administered 2016-06-27 – 2016-07-03 (×12): 1 via ORAL
  Filled 2016-06-27 (×12): qty 1

## 2016-06-27 MED ORDER — SODIUM CHLORIDE 0.9 % IV BOLUS (SEPSIS)
3000.0000 mL | Freq: Once | INTRAVENOUS | Status: AC
  Administered 2016-06-27: 3000 mL via INTRAVENOUS

## 2016-06-27 NOTE — Progress Notes (Signed)
Consult /PROGRESS Derrick [name] Derrick  [id number] DOB: 03-10-1951 DOA: 07/14/2016 PCP: [name], DO    Brief Narrative:  Derrick [name] is an 65 y.o. male past medical history of diabetes mellitus with a hemoglobin A1c of 12, hypertension, morbidly obese, diverticulosis with a history of colorectal cancer hyperlipidemia also peripheral vascular disease and coronary artery disease with a recent left lateral medullary infarct secondary to small vessel disease on Plavix that fell, started having back pain about 4 days prior to admission, went to the [facility] ED the day prior to admission, CT of the lumbar spine at that time for 5 disc herniation with compression of the S1 nerve was treated with IV medication and discharged home to follow-up with you as an outpatient. At that time he was started on Percocet and prednisone. She denies any lost control of his bowel regimen. He relates he continues to have pain. He saw Dr. Rachel Bo in the office decided to admit him and has consulted [place] for management of his medical problems.   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 Patient's Plavix on hold in anticipation of possible surgical intervention. Total CK greater 50,000. Statin has been discontinued. IV fluids. Per primary team.  #2 acute renal failure Basic metabolic profile obtained this morning with a creatinine of 2.03. Creatinine was 1.12 04/12/2016. Concern for prerenal azotemia in the setting of diuretics and ACE inhibitor versus secondary to rhabdomyolysis. Discontinue diuretics  and ACE inhibitor. Check a fractional excretion of sodium. Check a renal ultrasound. Total CK level greater than 50,000. Concern for rhabdomyolysis-induced acute renal failure. We'll give a 3 L bolus of normal saline. Increase IV fluids 250 mL per hour. With goal urine output of 2.5 L per day. Check daily CKs. Consult with nephrology for further evaluation and management.  #3 myalgias/rhabdomyolysis Total CK ordered by orthopedics this morning greater than 50,000. Patient's diet and has been discontinued. Will give a 3 L bolus. Increase IV fluids to 250cc/hr.With goal urine output of 2.5 L per day. Check daily total CKs. Comprehensive metabolic profile in the morning.  #4 hypertension Blood pressure stable. Continue Norvasc, Lopressor.  #5 diabetes mellitus Hemoglobin A1c 12.3 on 05/25/2016. CBGs have ranged from 120-186. Continue current dose of Levemir, meal coverage insulin, sliding scale insulin.  #6 coronary artery disease status post percutaneous coronary angioplasty Lab weeks on hold. Continue metoprolol.  #7 OSA CPAP QHS.     DVT prophylaxis: SCDs Code Status: Full Family Communication: Updated patient. No family present. Disposition Plan: Per primary team.   Consultants:   [facility] Dr. Adline Peals [id number]  Procedures:   Renal ultrasound pending 06/27/2016  MRI of the C and T-spine pending 06/27/2016  Antimicrobials:   None   Subjective: Patient complaining of back pain and lower extremity weakness to the point where he has difficulty ambulating. Denies any chest pain. No shortness of breath. Patient states has good urine output. Patient with some myalgias upper extremities.  Objective: Vitals:   07/01/2016 1752 07/15/2016 2041 06/27/16 0443  BP: 135/90 (!) 162/93 (!) 152/92  Pulse: [name])  108 98 82  Resp: 20 20 20   Temp: 98.9 F (37.2 C) 98.8 F (37.1 C) 98.6 F (37 C)  TempSrc: Oral Oral Oral  SpO2: 99% 96% 93%  Weight: 123.7 kg (272 lb 11.3  oz)    Height: 5\' 8"  (1.727 m)      Intake/Output Summary (Last 24 hours) at 06/27/16 1207 Last data filed at 06/27/16 1138  Gross per 24 hour  Intake            31.67 ml  Output             1650 ml  Net         -1618.33 ml   Filed Weights   06/16/2016 1752  Weight: 123.7 kg (272 lb 11.3 oz)    Examination:  General exam: Sitting up in chair. Diaphoretic. Respiratory system: Clear to auscultation. Respiratory effort normal. Cardiovascular system: S1 & S2 heard, RRR. No JVD, murmurs, rubs, gallops or clicks. No pedal edema. Gastrointestinal system: Abdomen is mildly distended, soft and nontender. No organomegaly or masses felt. Normal bowel sounds heard. Central nervous system: Alert and oriented. No focal neurological deficits. Extremities: 3/5 BLE strength Skin: No rashes, lesions or ulcers Psychiatry: Judgement and insight appear normal. Mood & affect appropriate.     Data Reviewed: I have personally reviewed following labs and imaging studies  CBC:  Recent Labs Lab 06/27/16 0903  WBC 10.2  NEUTROABS 7.0  HGB 13.0  HCT 36.5*  MCV 78.8  PLT [id number]   Basic Metabolic Panel:  Recent Labs Lab 06/27/16 0903  NA 137  K 5.3*  CL 106  CO2 21*  GLUCOSE 181*  BUN 46*  CREATININE 2.03*  CALCIUM 8.4*   GFR: Estimated Creatinine Clearance: 46.4 mL/min (by C-G formula based on SCr of 2.03 mg/dL (H)). Liver Function Tests: No results for input(s): AST, ALT, ALKPHOS, BILITOT, PROT, ALBUMIN in the last 168 hours. No results for input(s): LIPASE, AMYLASE in the last 168 hours. No results for input(s): AMMONIA in the last 168 hours. Coagulation Profile:  Recent Labs Lab 06/27/16 0903  INR 0.98   Cardiac Enzymes: No results for input(s): CKTOTAL, CKMB, CKMBINDEX, TROPONINI in the last 168 hours. BNP (last 3 results) No results for input(s): PROBNP in the last 8760 hours. HbA1C: No results for input(s): HGBA1C in the last 72 hours. CBG:  Recent Labs Lab  06/25/16 0938 06/17/2016 2044  GLUCAP 280* 186*   Lipid Profile: No results for input(s): CHOL, HDL, LDLCALC, TRIG, CHOLHDL, LDLDIRECT in the last 72 hours. Thyroid Function Tests: No results for input(s): TSH, T4TOTAL, FREET4, T3FREE, THYROIDAB in the last 72 hours. Anemia Panel: No results for input(s): VITAMINB12, FOLATE, FERRITIN, TIBC, IRON, RETICCTPCT in the last 72 hours. Sepsis Labs: No results for input(s): PROCALCITON, LATICACIDVEN in the last 168 hours.  Recent Results (from the past 240 hour(s))  Surgical PCR screen     Status: None   Collection Time: 06/21/2016  6:00 PM  Result Value Ref Range Status   MRSA, PCR NEGATIVE NEGATIVE Final   Staphylococcus aureus NEGATIVE NEGATIVE Final    Comment:        The Xpert SA Assay (FDA approved for NASAL specimens in patients over 66 years of age), is one component of a comprehensive surveillance program.  Test performance has been validated by [facility] for patients greater than or equal to 80 year old. It is not intended to diagnose infection nor to guide or monitor treatment.  Radiology Studies: No results found.      Scheduled Meds: . amLODipine  5 mg Oral Daily  . docusate sodium  100 mg Oral BID  . gabapentin  300 mg Oral QHS  . hydrochlorothiazide  12.5 mg Oral Daily  . insulin aspart  0-20 Units Subcutaneous TID WC  . insulin aspart  0-5 Units Subcutaneous QHS  . insulin aspart  4 Units Subcutaneous TID WC  . insulin detemir  20 Units Subcutaneous BID  . levothyroxine  75 mcg Oral QAC breakfast  . linagliptin  5 mg Oral Daily  . lisinopril  40 mg Oral Daily  . metFORMIN  1,000 mg Oral BID WC  . metoprolol tartrate  12.5 mg Oral BID  . polyethylene glycol  17 g Oral Daily  . simvastatin  40 mg Oral QPM  . sodium chloride flush  3 mL Intravenous Q12H   Continuous Infusions: . sodium chloride 100 mL/hr at 07/02/2016 1838     LOS: 1 day    Time spent: 59 mins    Sheketa Ende,  MD [facility] Pager [phone]  If 7PM-7AM, please contact night-coverage [email or web address] Password [email or web address] 06/27/2016, 12:07 PM

## 2016-06-27 NOTE — Telephone Encounter (Signed)
Pt currently admitted in the hospital. 

## 2016-06-27 NOTE — Progress Notes (Signed)
The order for simvastatin(Zocor) was changed to an equivalent dose of atorvastatin(Lipitor) due to the potential drug interaction with amlodipine.  When taken in combination with medications that inhibit its metabolism, simvastatin can accumulate which increases the risk of liver toxicity, myopathy, or rhabdomyolysis.  Simvastatin dose should not exceed 10mg /day in patients taking verapamil, diltiazem, fibrates, or niacin >or= 1g/day.   Simvastatin dose should not exceed 20mg /day in patients taking amlodipine, ranolazine or amiodarone.   Please consider this potential interaction at discharge.  Royetta Asal, PharmD, BCPS Pager [phone] 06/27/2016 3:17 PM

## 2016-06-27 NOTE — Progress Notes (Signed)
Subjective:    Patient reports pain as 2 on 0-10 scale. His lower extremity strength is better than yesterday in the office but is by all means not close to normal.Upper extremity strength is normal..CPK ordered since he has been od anti-Cholesterol meds. His CT of his Lumbar spin does not explain his lower extremity issues.Will wait on his MRI Scans of his Cervical and Lumbar Spines.BUN and Creatinine studies are abnormal.   Objective: Vital signs in last 24 hours: Temp:  [98.6 F (37 C)-98.9 F (37.2 C)] 98.6 F (37 C) (12/13 0443) Pulse Rate:  [82-108] 82 (12/13 0443) Resp:  [20] 20 (12/13 0443) BP: (135-162)/(90-93) 152/92 (12/13 0443) SpO2:  [93 %-99 %] 93 % (12/13 0443) Weight:  [123.7 kg (272 lb 11.3 oz)] 123.7 kg (272 lb 11.3 oz) (12/12 1752)  Intake/Output from previous day: 12/12 0701 - 12/13 0700 In: 31.7 [I.V.:31.7] Out: -  Intake/Output this shift: No intake/output data recorded.   Recent Labs  06/27/16 0903  HGB 13.0    Recent Labs  06/27/16 0903  WBC 10.2  RBC 4.63  HCT 36.5*  PLT 228    Recent Labs  06/27/16 0903  NA 137  K 5.3*  CL 106  CO2 21*  BUN 46*  CREATININE 2.03*  GLUCOSE 181*  CALCIUM 8.4*    Recent Labs  06/27/16 0903  INR 0.98    Very bizarre findings in lower extremities. Today his muscle strenght is definitely better but far from being normal. His upper motor strength is Normal. He has been on Simvstatin for sometime. Will order a CPK.  Assessment/Plan:    Up with therapy MRI examination of the internal auditory canals of Lumbar and Cervical Spine ordered.CPK ordered as well. Will get Neurology consult.  Derrick [name] A 06/27/2016, 11:36 AM

## 2016-06-27 NOTE — Progress Notes (Signed)
MD informed RN that he wants the pt transferred to [place]. Bed placement informed RN that there were not any beds available until later this afternoon. No questions or concerns at this time.  Laranda Burkemper W Shantale Holtmeyer, RN
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
## 2016-06-28 ENCOUNTER — Encounter (HOSPITAL_COMMUNITY): Payer: Self-pay

## 2016-06-28 DIAGNOSIS — E875 Hyperkalemia: Secondary | ICD-10-CM | POA: Diagnosis not present

## 2016-06-28 LAB — BASIC METABOLIC PANEL
ANION GAP: 9 (ref 5–15)
BUN: 48 mg/dL — ABNORMAL HIGH (ref 6–20)
CHLORIDE: 110 mmol/L (ref 101–111)
CO2: 23 mmol/L (ref 22–32)
Calcium: 8.3 mg/dL — ABNORMAL LOW (ref 8.9–10.3)
Creatinine, Ser: 2.02 mg/dL — ABNORMAL HIGH (ref 0.61–1.24)
GFR calc Af Amer: 38 mL/min — ABNORMAL LOW (ref >60)
GFR calc non Af Amer: 33 mL/min — ABNORMAL LOW (ref >60)
GLUCOSE: 204 mg/dL — AB (ref 65–99)
POTASSIUM: 5.4 mmol/L — AB (ref 3.5–5.1)
Sodium: 142 mmol/L (ref 135–145)

## 2016-06-28 LAB — COMPREHENSIVE METABOLIC PANEL
ALBUMIN: 3.2 g/dL — AB (ref 3.5–5.0)
ALK PHOS: 46 U/L (ref 38–126)
ALT: 220 U/L — ABNORMAL HIGH (ref 17–63)
ANION GAP: 8 (ref 5–15)
AST: 1015 U/L — ABNORMAL HIGH (ref 15–41)
BUN: 52 mg/dL — ABNORMAL HIGH (ref 6–20)
CALCIUM: 8.1 mg/dL — AB (ref 8.9–10.3)
CHLORIDE: 113 mmol/L — AB (ref 101–111)
CO2: 19 mmol/L — AB (ref 22–32)
Creatinine, Ser: 2.13 mg/dL — ABNORMAL HIGH (ref 0.61–1.24)
GFR calc non Af Amer: 31 mL/min — ABNORMAL LOW (ref >60)
GFR, EST AFRICAN AMERICAN: 36 mL/min — AB (ref >60)
GLUCOSE: 181 mg/dL — AB (ref 65–99)
POTASSIUM: 6.5 mmol/L — AB (ref 3.5–5.1)
SODIUM: 140 mmol/L (ref 135–145)
Total Bilirubin: 0.7 mg/dL (ref 0.3–1.2)
Total Protein: 6.8 g/dL (ref 6.5–8.1)

## 2016-06-28 LAB — CBC
HEMATOCRIT: 36.8 % — AB (ref 39.0–52.0)
HEMOGLOBIN: 13 g/dL (ref 13.0–17.0)
MCH: 28.2 pg (ref 26.0–34.0)
MCHC: 35.3 g/dL (ref 30.0–36.0)
MCV: 79.8 fL (ref 78.0–100.0)
Platelets: 262 10*3/uL (ref 150–400)
RBC: 4.61 MIL/uL (ref 4.22–5.81)
RDW: 14.6 % (ref 11.5–15.5)
WBC: 11 10*3/uL — ABNORMAL HIGH (ref 4.0–10.5)

## 2016-06-28 LAB — GLUCOSE, CAPILLARY
GLUCOSE-CAPILLARY: 79 mg/dL (ref 65–99)
Glucose-Capillary: 168 mg/dL — ABNORMAL HIGH (ref 65–99)
Glucose-Capillary: 207 mg/dL — ABNORMAL HIGH (ref 65–99)
Glucose-Capillary: 186 mg/dL — ABNORMAL HIGH (ref 65–99)

## 2016-06-28 LAB — SEDIMENTATION RATE: SED RATE: 47 mm/h — AB (ref 0–16)

## 2016-06-28 LAB — C-REACTIVE PROTEIN: CRP: 1.6 mg/dL — AB (ref <1.0)

## 2016-06-28 LAB — CK

## 2016-06-28 LAB — TSH: TSH: 9.171 u[IU]/mL — ABNORMAL HIGH (ref 0.350–4.500)

## 2016-06-28 MED ORDER — CALCIUM GLUCONATE 10 % IV SOLN
1.0000 g | Freq: Once | INTRAVENOUS | Status: AC
  Administered 2016-06-28: 1 g via INTRAVENOUS
  Filled 2016-06-28: qty 10

## 2016-06-28 MED ORDER — SODIUM POLYSTYRENE SULFONATE 15 GM/60ML PO SUSP: 45.0000 g | Freq: Once | ORAL | Status: DC

## 2016-06-28 MED ORDER — INSULIN ASPART 100 UNIT/ML IV SOLN
10.0000 [IU] | Freq: Once | INTRAVENOUS | Status: AC
  Administered 2016-06-28: 10 [IU] via INTRAVENOUS

## 2016-06-28 MED ORDER — CLOPIDOGREL BISULFATE 75 MG PO TABS
75.0000 mg | ORAL_TABLET | Freq: Every day | ORAL | Status: DC
  Administered 2016-06-28: 75 mg via ORAL
  Filled 2016-06-28: qty 1

## 2016-06-28 MED ORDER — AMLODIPINE BESYLATE 10 MG PO TABS
10.0000 mg | ORAL_TABLET | Freq: Every day | ORAL | Status: DC
  Administered 2016-06-29 – 2016-07-03 (×5): 10 mg via ORAL
  Filled 2016-06-28 (×5): qty 1

## 2016-06-28 MED ORDER — AMLODIPINE BESYLATE 5 MG PO TABS
5.0000 mg | ORAL_TABLET | Freq: Once | ORAL | Status: AC
  Administered 2016-06-28: 5 mg via ORAL
  Filled 2016-06-28: qty 1

## 2016-06-28 MED ORDER — INSULIN DETEMIR 100 UNIT/ML ~~LOC~~ SOLN
15.0000 [IU] | Freq: Two times a day (BID) | SUBCUTANEOUS | Status: DC
  Administered 2016-06-28 – 2016-07-03 (×11): 15 [IU] via SUBCUTANEOUS
  Filled 2016-06-28 (×12): qty 0.15

## 2016-06-28 MED ORDER — SODIUM POLYSTYRENE SULFONATE 15 GM/60ML PO SUSP
30.0000 g | Freq: Once | ORAL | Status: AC
  Administered 2016-06-28: 30 g via ORAL
  Filled 2016-06-28: qty 120

## 2016-06-28 MED ORDER — DEXTROSE 50 % IV SOLN
1.0000 | Freq: Once | INTRAVENOUS | Status: AC
  Administered 2016-06-28: 50 mL via INTRAVENOUS
  Filled 2016-06-28: qty 50

## 2016-06-28 MED ORDER — SODIUM BICARBONATE 8.4 % IV SOLN
INTRAVENOUS | Status: DC
  Administered 2016-06-28 – 2016-07-03 (×10): via INTRAVENOUS
  Filled 2016-06-28: qty 150
  Filled 2016-06-28: qty 100
  Filled 2016-06-28 (×12): qty 150

## 2016-06-28 MED ORDER — LEVOTHYROXINE SODIUM 100 MCG PO TABS
100.0000 ug | ORAL_TABLET | Freq: Every day | ORAL | Status: DC
  Administered 2016-06-29 – 2016-07-03 (×5): 100 ug via ORAL
  Filled 2016-06-28 (×5): qty 1

## 2016-06-28 MED ORDER — POLYETHYLENE GLYCOL 3350 17 G PO PACK
17.0000 g | PACK | Freq: Two times a day (BID) | ORAL | Status: DC
  Administered 2016-06-29 – 2016-07-03 (×8): 17 g via ORAL
  Filled 2016-06-28 (×8): qty 1

## 2016-06-28 NOTE — Clinical Social Work Note (Signed)
Clinical Social Work Assessment  Patient Details  Name: Derrick [name] MRN: [id number] Date of Birth: 1950/09/12  Date of referral:  06/28/16               Reason for consult:  Facility Placement                Permission sought to share information with:  Facility Art therapist granted to share information::     Name::     Air cabin crew::  SNF  Relationship::  Daughter  Contact Information:  Home:[phone], [phone], [phone]  Housing/Transportation Living arrangements for the past 2 months:  [place], Apartment Source of Information:  Patient Patient Interpreter Needed:  None Criminal Activity/Legal Involvement Pertinent to Current Situation/Hospitalization:  No - Comment as needed Significant Relationships:  None Lives with:  Self Do you feel safe going back to the place where you live?  Yes Need for family participation in patient care:  Yes (Comment)  Care giving concerns:  Patient [facility] to [facility] with lower back pain that has diminished his leg strength, patient has a history of back pain.    Social Worker assessment / plan:  LCSWA met with  daughter and family at bedside, pt. Lethargic during assessment. LCSWA explained reason for consult, to assist with placement to SNF. Patient daughter reports pt. And family live in [place] and want the patient to close to home. LCSWA educated family about the faxing out process and list of offers.  Plan: Assist with SNF placement. Complete FL2, PASSR.   Employment status:  Disabled (Comment on whether or not currently receiving Disability) Insurance information:  Medicaid In [place], [place] PT Recommendations:  [place] / Referral to community resources:  [place]  Patient/Family's Response to care:  Agreeable to care and responding well to care.   Patient/Family's Understanding of and Emotional Response to Diagnosis, Current  Treatment, and Prognosis: Patient daughter Derrick [name] understands patient will need physical therapy before going home.   Emotional Assessment Appearance:  Appears stated age Attitude/Demeanor/Rapport:  Unable to Assess Affect (typically observed):  Unable to Assess Orientation:  Oriented to Self, Oriented to Place, Oriented to  Time, Oriented to Situation Alcohol / Substance use:  Not Applicable Psych involvement (Current and /or in the community):  No  Discharge Needs  Concerns to be addressed:  Care Coordination, Discharge Planning Concerns Readmission within the last 30 days:    Current discharge risk:  None, Dependent with Mobility Barriers to Discharge:  Continued Medical Work up   [employer], LCSW 06/28/2016, 12:29 PM

## 2016-06-28 NOTE — Progress Notes (Signed)
[facility] Progress Note   Subjective: no c/o's, has sweating spells , says this is related to old CVA he had   Vitals:   06/27/16 1417 06/27/16 2040 06/28/16 0449 06/28/16 1538  BP: 138/76 139/82 136/81 (!) 174/97  Pulse: 69 86 80 83  Resp: 18 18 18 20   Temp: 98.5 F (36.9 C) 98.2 F (36.8 C) 97.8 F (36.6 C) 98 F (36.7 C)  TempSrc: Oral Oral Oral Oral  SpO2: 93% 95% 93% 91%  Weight:      Height:        Inpatient medications: . amLODipine  5 mg Oral Daily  . insulin aspart  0-20 Units Subcutaneous TID WC  . insulin aspart  0-5 Units Subcutaneous QHS  . insulin aspart  4 Units Subcutaneous TID WC  . insulin detemir  20 Units Subcutaneous BID  . levothyroxine  75 mcg Oral QAC breakfast  . metoprolol tartrate  12.5 mg Oral BID  . polyethylene glycol  17 g Oral BID  . senna-docusate  1 tablet Oral BID  . sodium chloride flush  3 mL Intravenous Q12H   .  sodium bicarbonate  infusion 1000 mL 200 mL/hr at 06/28/16 0757   sodium chloride, acetaminophen **OR** acetaminophen, bisacodyl, HYDROcodone-acetaminophen, HYDROmorphone (DILAUDID) injection, ondansetron **OR** ondansetron (ZOFRAN) IV, oxyCODONE, sodium chloride flush  Exam: GEN: NAD, lying in bed, family at bedside HEENT EOMI, PERRL, MMM.  Some mild voice hoarseness NECK Supple, well-healed L-sided neck scar, no JVD PULM normal WOB, CTAB no c/w/r CV RRR no m/r/g ABD obese, mildly tender around umbilicus, well-healed midline surgical scar, no HSM, NABS EXT: no LE edema.  All compartments of the bilateral lower and upper extremities soft and compressible NEURO: AAO x 3.  R LE weak to plantar and dorsiflexion.  Able to flex both lower extremities at the hip.  5/5 strength right upper extremity.  L upper extremity weak to deltoid flexsion/ extension and biceps extension (although seems like limited by pain more than anything).    Assessment/Plan  1.  Acute kidney injury:  Due to rhabdo as below.   Continue IVF's as he is making good urine (please avoid NSAIDs, IV contrast as able, mag citrate, and fleets enemas). Cr down a little.   2.  Rhabdomyolysis: unclear precipitant.  Pt has been on statin for a while.  Doesn't appear to have been down for a long time and this is corroborated by family.  Consider a myositis or inflammatory myopathy if no improvement after fluids and removal of statin.  Neurology has been consulted for their expertise as well.   3.  Mild hyperkalemia: watch for now.  Would expect to improve with fluid resuscitation  4.  Mild metabolic acidosis: due to AKI  5.  Acute herniation of intervertebral disc L5/S1: per ortho  6. DM II : very poorly controlled.  Per primary  7.  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    Plan - as above   Kelly Splinter MD [facility] pager [phone]   06/28/2016, 4:49 PM    Recent Labs Lab 06/27/16 0903 06/28/16 0544 06/28/16 1219  NA 137 140 142  K 5.3* 6.5* 5.4*  CL 106 113* 110  CO2 21* 19* 23  GLUCOSE 181* 181* 204*  BUN 46* 52* 48*  CREATININE 2.03* 2.13* 2.02*  CALCIUM 8.4* 8.1* 8.3*    Recent Labs Lab 06/28/16 0544  AST 1,015*  ALT 220*  ALKPHOS 46  BILITOT 0.7  PROT 6.8  ALBUMIN 3.2*    Recent Labs Lab 06/27/16 0903 06/28/16 0544  WBC 10.2 11.0*  NEUTROABS 7.0  --   HGB 13.0 13.0  HCT 36.5* 36.8*  MCV 78.8 79.8  PLT 228 262   Iron/TIBC/Ferritin/ %Sat    Component Value Date/Time   IRON 39 (L) 09/28/2014 1615   TIBC 285 09/28/2014 1615   FERRITIN 201 01/31/2015 1247   IRONPCTSAT 14 (L) 09/28/2014 1615

## 2016-06-28 NOTE — NC FL2 (Signed)
[place] LEVEL OF CARE SCREENING TOOL     IDENTIFICATION  Patient Name: Derrick [name] Birthdate: 1950-12-12 Sex: male Admission Date (Current Location): 06/24/2016  [facility] and [place] Number:  Engineer, manufacturing systems and Address:  [facility],  [place] [address], [place]      Provider Number: [id number]  Attending Physician Name and Address:  Latanya Maudlin, MD  Relative Name and Phone Number:       Current Level of Care: Hospital Recommended Level of Care: [place] Prior Approval Number:    Date Approved/Denied:   PASRR Number:    Discharge Plan: SNF    Current Diagnoses: Patient Active Problem List   Diagnosis Date Noted  . Hyperkalemia 06/28/2016  . ARF (acute renal failure) ([place]) 06/27/2016  . Rhabdomyolysis 06/27/2016  . Weakness of both legs   . 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 Cough 05/25/2016  . Anxiety 05/25/2016  . Vocal cord paralysis 04/11/2016  . Right foot pain 03/30/2016  . 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 Dysphonia 12/01/2015  . 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 Personal history of colon cancer 10/19/2015  . Healthcare maintenance 09/21/2015  . Elevated TSH 08/04/2015  . Cancer of ascending colon ([employer]) 09/06/2014  . CAD S/P percutaneous coronary angioplasty 08/17/2014  . Essential hypertension 08/17/2014  . Mixed hyperlipidemia 08/17/2014  . Type 2 diabetes mellitus ([place]) 08/17/2014  . History of stroke 08/17/2014  . Hx of adenomatous colonic polyps 03/19/2012    Orientation RESPIRATION BLADDER Height & Weight     Self, Time, Situation, Place  Normal Continent Weight: 272 lb 11.3 oz (123.7 kg) Height:  5\' 8"  (172.7 cm)  BEHAVIORAL SYMPTOMS/MOOD NEUROLOGICAL BOWEL NUTRITION STATUS      Continent Diet (See discharge  Summary)  AMBULATORY STATUS COMMUNICATION OF NEEDS Skin   Extensive Assist Verbally Normal                       Personal Care Assistance Level of Assistance  Bathing, Feeding, Dressing Bathing Assistance: Limited assistance Feeding assistance: Independent Dressing Assistance: Limited assistance     Functional Limitations Info  Sight, Hearing, Speech Sight Info: Adequate Hearing Info: Adequate Speech Info: Adequate    SPECIAL CARE FACTORS FREQUENCY  PT (By licensed PT), OT (By licensed OT)     PT Frequency: 5 OT Frequency: 5            Contractures Contractures Info: Not present    Additional Factors Info  Code Status, Allergies, Insulin Sliding Scale Code Status Info: FullCode Allergies Info: Ivp Dye Iodinated Diagnostic Agents, Lasix Furosemide   Insulin Sliding Scale Info: insulin aspart (novoLOG) injection 0-20 Units, insulin aspart (novoLOG) injection 0-5 Units, insulin aspart (novoLOG) injection 4 Units, insulin detemir (LEVEMIR) injection 20 Units       Current Medications (06/28/2016):  This is the current hospital active medication list Current Facility-Administered Medications  Medication Dose Route Frequency Provider Last Rate Last Dose  . 0.9 %  sodium chloride infusion  250 mL Intravenous PRN Amber Constable, PA-C      . acetaminophen (TYLENOL) tablet 650 mg  650 mg Oral Q6H PRN Amber Constable, PA-C       Or  . acetaminophen (TYLENOL) suppository 650 mg  650 mg Rectal Q6H PRN Amber Constable, PA-C      . amLODipine (  NORVASC) tablet 5 mg  5 mg Oral Daily Charlynne Cousins, MD   5 mg at 06/28/16 1041  . bisacodyl (DULCOLAX) suppository 10 mg  10 mg Rectal Daily PRN Amber Constable, PA-C      . clopidogrel (PLAVIX) tablet 75 mg  75 mg Oral Daily [employer], PA-C   75 mg at 06/28/16 1041  . HYDROcodone-acetaminophen (NORCO/VICODIN) 5-325 MG per tablet 1-2 tablet  1-2 tablet Oral Q6H PRN Ardeen Jourdain, PA-C   2 tablet at 06/27/16 2325  .  HYDROmorphone (DILAUDID) injection 0.5-1 mg  0.5-1 mg Intravenous Q3H PRN Latanya Maudlin, MD   1 mg at 06/27/16 2203  . insulin aspart (novoLOG) injection 0-20 Units  0-20 Units Subcutaneous TID WC Charlynne Cousins, MD   4 Units at 06/28/16 1030  . insulin aspart (novoLOG) injection 0-5 Units  0-5 Units Subcutaneous QHS Charlynne Cousins, MD      . insulin aspart (novoLOG) injection 4 Units  4 Units Subcutaneous TID WC Charlynne Cousins, MD   4 Units at 06/27/16 1330  . insulin detemir (LEVEMIR) injection 20 Units  20 Units Subcutaneous BID Charlynne Cousins, MD   20 Units at 06/28/16 1042  . levothyroxine (SYNTHROID, LEVOTHROID) tablet 75 mcg  75 mcg Oral QAC breakfast [employer], PA-C   75 mcg at 06/28/16 0803  . metoprolol tartrate (LOPRESSOR) tablet 12.5 mg  12.5 mg Oral BID Charlynne Cousins, MD   12.5 mg at 06/28/16 1041  . ondansetron (ZOFRAN) tablet 4 mg  4 mg Oral Q6H PRN Amber Constable, PA-C       Or  . ondansetron (ZOFRAN) injection 4 mg  4 mg Intravenous Q6H PRN Amber Constable, PA-C      . oxyCODONE (Oxy IR/ROXICODONE) immediate release tablet 5-10 mg  5-10 mg Oral Q4H PRN Amber Constable, PA-C   10 mg at 06/28/16 1054  . polyethylene glycol (MIRALAX / GLYCOLAX) packet 17 g  17 g Oral BID Eugenie Filler, MD      . senna-docusate (Senokot-S) tablet 1 tablet  1 tablet Oral BID Eugenie Filler, MD   1 tablet at 06/28/16 1042  . sodium bicarbonate 150 mEq in dextrose 5 % 1,000 mL infusion   Intravenous Continuous Madelon Lips, MD 200 mL/hr at 06/28/16 0757    . sodium chloride flush (NS) 0.9 % injection 3 mL  3 mL Intravenous Q12H Amber Constable, PA-C      . sodium chloride flush (NS) 0.9 % injection 3 mL  3 mL Intravenous PRN Ardeen Jourdain, PA-C         Discharge Medications: Please see discharge summary for a list of discharge medications.  Relevant Imaging Results:  Relevant Lab Results:   Additional Information ssn: [id number]  Lia Hopping, LCSW

## 2016-06-28 NOTE — Progress Notes (Signed)
Subjective:    Patient reports pain as 1 on 0-10 scale.His CPK is greater than 50,000.He does have some movement in his lowers. I explained to his family that I believe that his problem could be due to the Simvastatin. It was DCd. Will restart his Plavix and have PT work with him.    Objective: Vital signs in last 24 hours: Temp:  [97.8 F (36.6 C)-98.5 F (36.9 C)] 97.8 F (36.6 C) (12/14 0449) Pulse Rate:  [69-86] 80 (12/14 0449) Resp:  [18] 18 (12/14 0449) BP: (136-139)/(76-82) 136/81 (12/14 0449) SpO2:  [93 %-95 %] 93 % (12/14 0449)  Intake/Output from previous day: 12/13 0701 - 12/14 0700 In: 6485 [P.O.:120; I.V.:5765] Out: 3250 [Urine:3250] Intake/Output this shift: No intake/output data recorded.   Recent Labs  06/27/16 0903 06/28/16 0544  HGB 13.0 13.0    Recent Labs  06/27/16 0903 06/28/16 0544  WBC 10.2 11.0*  RBC 4.63 4.61  HCT 36.5* 36.8*  PLT 228 262    Recent Labs  06/27/16 0903 06/28/16 0544  NA 137 140  K 5.3* 6.5*  CL 106 113*  CO2 21* 19*  BUN 46* 52*  CREATININE 2.03* 2.13*  GLUCOSE 181* 181*  CALCIUM 8.4* 8.1*    Recent Labs  06/27/16 0903  INR 0.98     Assessment/Plan:    See my note.Will get PT involved. And plan on SNF as well.  [name] 06/28/2016, 7:38 AM

## 2016-06-28 NOTE — Progress Notes (Signed)
RT NOTE:  Pt refuses CPAP tonight. No complications noted. Pt understands to call if he changes his mind.
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
## 2016-06-28 NOTE — Evaluation (Signed)
Physical Therapy Evaluation Patient Details Name: Derrick [name] MRN: [id number] DOB: [birth year] Today's Date: 06/28/2016   History of Present Illness  HPI:           Derrick [name] is an 65 y.o. male with  PMH significant for poorly controlled DM II , HTN, HLD, CAD, acute stroke in May 2017 on Plavix, OSA, and DDD . Presented to [facility],  There, CT of spine showed disc protrusion at L5-S1.  He was ultimately discharged to outpatient followup.  The night after ED discharge, he fell at home.    When he arrived at Dr. Charlestine Night office for outpatient followup, he was unable to walk.  Was admitted to [facility] and difficulty ambulating due to pain and weakness. patient was admitted to hospital and   found to have an acute kidney injury.   He also has significant spinal stenosis at T12 but unfortunately is unable to obtain MRI due to body habitus.  The reason for weakness is unknown at present. Initially suspected possibly due to statin/ myopathy. Further workup is pending.                                                                                                                          Clinical Impression  The evaluation was limited as patient was lethargic, speech slurred,sonorous while sleeping.  Noted numerous tests ordered in attempts for definitive diagnosis. Pt admitted with above diagnosis. Pt currently with functional limitations due to the deficits listed below (see PT Problem List).  Pt will benefit from skilled PT to increase their independence and safety with mobility to allow discharge to the venue listed below.       Follow Up Recommendations SNF;Supervision/Assistance - 24 hour    Equipment Recommendations   (tbd)    Recommendations for Other Services       Precautions / Restrictions Precautions Precautions: Fall      Mobility  Bed Mobility               General bed mobility comments: attempted rolling, max assist to flex legs in prep to roll.  the patient  was too lethargic to participate. the RN reported that the patient has been assisted to [facility] with 2 assist, Uncertain that he can sfely attempt this today.  Transfers                 General transfer comment: unable  Ambulation/Gait                Stairs            Wheelchair Mobility    Modified Rankin (Stroke Patients Only)       Balance                                             Pertinent Vitals/Pain Pain Assessment: No/denies  pain    Home Living Family/patient expects to be discharged to:: Private residence Living Arrangements: Children Available Help at Discharge: Family;Available 24 hours/day Type of Home: Apartment Home Access: Stairs to enter Entrance Stairs-Rails: None Entrance Stairs-Number of Steps: 5 Home Layout: Two level Home Equipment: Cane - single point Additional Comments: Patient was very groggy and unable to provide  details.     Prior Function           Comments: Per notes leading up to admission, patient was ambulatory but uncertain if used AD.     Hand Dominance        Extremity/Trunk Assessment   Upper Extremity Assessment Upper Extremity Assessment: Defer to OT evaluation    Lower Extremity Assessment Lower Extremity Assessment: RLE deficits/detail;LLE deficits/detail RLE Deficits / Details: hip flexion 2, knee ext 2+, dorsiflexion 4 LLE Deficits / Details: hip flexion1+, knee ext2+, dorsiflexion 4,        Communication   Communication: Expressive difficulties (patient's speech slurred)  Cognition Arousal/Alertness: Lethargic   Overall Cognitive Status: No family/caregiver present to determine baseline cognitive functioning                 General Comments: patient was unable to effectively communicate today.    General Comments      Exercises     Assessment/Plan    PT Assessment Patient needs continued PT services  PT Problem List Decreased strength;Decreased activity  tolerance;Decreased mobility;Decreased cognition;Decreased knowledge of use of DME;Decreased safety awareness;Decreased knowledge of precautions          PT Treatment Interventions DME instruction;Gait training;Functional mobility training;Therapeutic activities;Therapeutic exercise;Patient/family education    PT Goals (Current goals can be found in the Care Plan section)  Acute Rehab PT Goals PT Goal Formulation: Patient unable to participate in goal setting Time For Goal Achievement: 07/12/16 Potential to Achieve Goals: Fair    Frequency Min 3X/week   Barriers to discharge Decreased caregiver support;Inaccessible home environment      Co-evaluation               End of Session   Activity Tolerance: Patient limited by lethargy Patient left: in bed;with call bell/phone within reach;with bed alarm set Nurse Communication: Mobility status         Time: [id number] PT Time Calculation (min) (ACUTE ONLY): 20 min   Charges:   PT Evaluation $PT Eval Moderate Complexity: 1 Procedure     PT G CodesClaretha Cooper 06/28/2016, 3:09 PM  Tresa Endo PT [phone]
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
## 2016-06-29 LAB — COMPREHENSIVE METABOLIC PANEL
ALK PHOS: 44 U/L (ref 38–126)
ALT: 230 U/L — AB (ref 17–63)
AST: 861 U/L — ABNORMAL HIGH (ref 15–41)
Albumin: 3 g/dL — ABNORMAL LOW (ref 3.5–5.0)
Anion gap: 10 (ref 5–15)
BUN: 43 mg/dL — ABNORMAL HIGH (ref 6–20)
CALCIUM: 8.7 mg/dL — AB (ref 8.9–10.3)
CO2: 29 mmol/L (ref 22–32)
CREATININE: 1.61 mg/dL — AB (ref 0.61–1.24)
Chloride: 104 mmol/L (ref 101–111)
GFR calc non Af Amer: 43 mL/min — ABNORMAL LOW (ref >60)
GFR, EST AFRICAN AMERICAN: 50 mL/min — AB (ref >60)
GLUCOSE: 127 mg/dL — AB (ref 65–99)
Potassium: 4.6 mmol/L (ref 3.5–5.1)
SODIUM: 143 mmol/L (ref 135–145)
Total Bilirubin: 1 mg/dL (ref 0.3–1.2)
Total Protein: 6.6 g/dL (ref 6.5–8.1)

## 2016-06-29 LAB — CBC
HCT: 35.6 % — ABNORMAL LOW (ref 39.0–52.0)
Hemoglobin: 12.8 g/dL — ABNORMAL LOW (ref 13.0–17.0)
MCH: 28.1 pg (ref 26.0–34.0)
MCHC: 36 g/dL (ref 30.0–36.0)
MCV: 78.1 fL (ref 78.0–100.0)
PLATELETS: 240 10*3/uL (ref 150–400)
RBC: 4.56 MIL/uL (ref 4.22–5.81)
RDW: 14.5 % (ref 11.5–15.5)
WBC: 10.4 10*3/uL (ref 4.0–10.5)

## 2016-06-29 LAB — COXSACKIE A VIRUS ANTIBODIES
COXSACKIE A16 IGM: NEGATIVE {titer}
COXSACKIE A24 IGM: NEGATIVE {titer}
COXSACKIE A7 IGM: NEGATIVE {titer}
COXSACKIE A9 IGM: NEGATIVE {titer}
Coxsackie A16 IgG: 1:800 {titer} — ABNORMAL HIGH
Coxsackie A9 IgG: 1:800 {titer} — ABNORMAL HIGH

## 2016-06-29 LAB — HEPATITIS PANEL, ACUTE
HCV Ab: <0.1 s/co ratio (ref 0.0–0.9)
HEP B S AG: NEGATIVE
Hep A IgM: NEGATIVE
Hep B C IgM: NEGATIVE

## 2016-06-29 LAB — URINE CULTURE: CULTURE: NO GROWTH

## 2016-06-29 LAB — GLUCOSE, CAPILLARY
GLUCOSE-CAPILLARY: 144 mg/dL — AB (ref 65–99)
Glucose-Capillary: 148 mg/dL — ABNORMAL HIGH (ref 65–99)
Glucose-Capillary: 115 mg/dL — ABNORMAL HIGH (ref 65–99)
Glucose-Capillary: 181 mg/dL — ABNORMAL HIGH (ref 65–99)

## 2016-06-29 LAB — CK: Total CK: >50000 U/L — ABNORMAL HIGH (ref 49–397)

## 2016-06-29 LAB — HIV ANTIBODY (ROUTINE TESTING W REFLEX): HIV Screen 4th Generation wRfx: NONREACTIVE

## 2016-06-29 LAB — RHEUMATOID FACTOR: Rhuematoid fact SerPl-aCnc: <10 IU/mL (ref 0.0–13.9)

## 2016-06-29 NOTE — Progress Notes (Signed)
Subjective:    Patient reports pain as 1 on 0-10 scale.    Objective: Vital signs in last 24 hours: Temp:  [98 F (36.7 C)-98.1 F (36.7 C)] 98 F (36.7 C) (12/15 0454) Pulse Rate:  [83-102] 84 (12/15 0454) Resp:  [20] 20 (12/15 0454) BP: (173-175)/(93-97) 175/93 (12/15 0454) SpO2:  [91 %-96 %] 96 % (12/15 0454)  Intake/Output from previous day: 12/14 0701 - 12/15 0700 In: 120 [P.O.:120] Out: 3750 [Urine:3750] Intake/Output this shift: No intake/output data recorded.   Recent Labs  06/27/16 0903 06/28/16 0544 06/29/16 0537  HGB 13.0 13.0 12.8*    Recent Labs  06/28/16 0544 06/29/16 0537  WBC 11.0* 10.4  RBC 4.61 4.56  HCT 36.8* 35.6*  PLT 262 240    Recent Labs  06/28/16 1219 06/29/16 0537  NA 142 143  K 5.4* 4.6  CL 110 104  CO2 23 29  BUN 48* 43*  CREATININE 2.02* 1.61*  GLUCOSE 204* 127*  CALCIUM 8.3* 8.7*    Recent Labs  06/27/16 0903  INR 0.98    Continues to have bilateral lower extremity weakness but better movement in his lowers compared to admission. ..  Assessment/Plan:    Up with therapy. He is not very motivated. Would plan on SNF.I spoke with his family yesterday.  Jennavieve Arrick A 06/29/2016, 7:31 AM
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
## 2016-06-29 NOTE — Evaluation (Signed)
Occupational Therapy Evaluation Patient Details Name: Derrick [name] MRN: [id number] DOB: February 17, 1951 Today's Date: 06/29/2016    History of Present Illness Per chart,  Derrick [name] is an 65 y.o. male with  PMH significant for poorly controlled DM II , HTN, HLD, CAD, acute stroke in May 2017 on Plavix, OSA, and DDD . Presented to [facility],  There, CT of spine showed disc protrusion at L5-S1.  He was ultimately discharged to outpatient followup.  The night after ED discharge, he fell at home.    When he arrived at Dr. Charlestine Night office for outpatient followup, he was unable to walk.  Was admitted to [facility] and difficulty ambulating due to pain and weakness. patient was admitted to hospital and   found to have an acute kidney injury.   He also has significant spinal stenosis at T12 but unfortunately is unable to obtain MRI due to body habitus.  The reason for weakness is unknown at present. Initially suspected possibly due to statin/ myopathy. Further workup is pending.                                                                                                                           Clinical Impression   Pt was admitted for the above.  He was alert during evaluation but fatiqued.  Performed bed level evaluation including rolling; per nursing, he was up to [facility] earlier with +2 assistance.  Pt's UEs significantly weaker than what neuro tested yesterday.  Pt will benefit from continued OT to increase strength and independence with adls.  Pt currently needs up max/total A +2 for most adls.  Goals in acute are for min A for UB and mod +2 for SPT to [facility]    Follow Up Recommendations  SNF    Equipment Recommendations  3 in 1 bedside commode    Recommendations for Other Services       Precautions / Restrictions Precautions Precautions: Fall Restrictions Weight Bearing Restrictions: No      Mobility Bed Mobility Overal bed mobility: Needs Assistance Bed Mobility:  Rolling Rolling: Max assist;Mod assist;+2 for physical assistance         General bed mobility comments: able to roll to L with mod A +1; assist to bend LLE and used pad to help log roll.  Max A to partially roll to R.  Need +2 to roll completely  Transfers                 General transfer comment: not tested; RN reports nursing performed SPT to [facility] earlier with +2 assistance    Balance                                            ADL Overall ADL's : Needs assistance/impaired     Grooming: Wash/dry face;Set up;Bed level   Upper  Body Bathing: Moderate assistance;Bed level   Lower Body Bathing: Total assistance;+2 for physical assistance;Bed level   Upper Body Dressing : Maximal assistance;Bed level   Lower Body Dressing: Total assistance;+2 for physical assistance;Sit to/from stand                 General ADL Comments: nursing reports that pt got to commode with +2 assistance.  Rolled to bil sides to assess ADLs. Pt performed grooming task; he had already washed up today.  He did not want to sit at EOB during time of assessment due to fatigue     Vision     Perception     Praxis      Pertinent Vitals/Pain Pain Assessment: No/denies pain     Hand Dominance Right   Extremity/Trunk Assessment Upper Extremity Assessment Upper Extremity Assessment: Generalized weakness (bil shoulders 2+/5; able to lift approximately 40 against gravity.  Initially he did not control elbow extension motion, but he was able to do so with cues.)  Pt reports bil UEs feel tight           Communication Communication Communication: No difficulties   Cognition Arousal/Alertness: Awake/alert Behavior During Therapy: WFL for tasks assessed/performed Overall Cognitive Status: Within Functional Limits for tasks assessed                     General Comments       Exercises       Shoulder Instructions      Home Living Family/patient expects to be  discharged to:: Private residence Living Arrangements: Children Available Help at Discharge: Family;Available 24 hours/day               Bathroom Shower/Tub: Tub/shower unit Shower/tub characteristics: Curtain       Home Equipment: Cane - single point   Additional Comments: from chart.  Pt states he has 13 children; there was a room full of visitors initially      Prior Functioning/Environment Level of Independence: Independent;Independent with assistive device(s)                 OT Problem List: Decreased strength;Decreased activity tolerance;Decreased knowledge of use of DME or AE;Impaired UE functional use (balance NT)   OT Treatment/Interventions: Self-care/ADL training;Therapeutic exercise;DME and/or AE instruction;Therapeutic activities;Patient/family education    OT Goals(Current goals can be found in the care plan section) Acute Rehab OT Goals Patient Stated Goal: none stated; agreeable to OT OT Goal Formulation: With patient Time For Goal Achievement: 07/06/16 Potential to Achieve Goals: Fair ADL Goals Pt Will Perform Grooming: with set-up;sitting Pt Will Perform Upper Body Bathing: with min assist;sitting Pt Will Perform Upper Body Dressing: with min assist;sitting Pt Will Transfer to Toilet: with mod assist;with +2 assist;bedside commode;stand pivot transfer Additional ADL Goal #1: pt will perform bed mobility with mod A in preparation for adls/transfers to 3:1 Additional ADL Goal #2: Pt will tolerate AAROM x 10 reps x 2 motions to bil shoulders to increase strength for adls  OT Frequency: Min 2X/week   Barriers to D/C:            Co-evaluation              End of Session Nurse Communication:  (SDCs not working; UE strength decreased from neuro eval)  Activity Tolerance: Patient limited by fatigue Patient left: in bed;with call bell/phone within reach;with bed alarm set   Time: [id number] OT Time Calculation (min): 26 min Charges:  OT  General Charges $OT Visit:  1 Procedure OT Evaluation $OT Eval Moderate Complexity: 1 Procedure G-Codes:    Milica Gully July 13, 2016, 3:02 PM  Lesle Chris, OTR/L [phone] 07-13-2016

## 2016-06-29 NOTE — Consult Note (Signed)
   [facility] CM Inpatient Consult   06/29/2016  [place] [birth year] [id number]    Patient screened for potential [facility] Care Management services. Chart reviewed. Noted current discharge plan is for SNF.  There are no identifiable [facility] Care Management needs at this time. If patient's post hospital needs change, please place a [facility] Care Management consult. For questions please contact:  Marthenia Rolling, [place], RN,BSN [facility] Liaison [phone]

## 2016-06-29 NOTE — Progress Notes (Signed)
[facility] Progress Note   Subjective: creat down to 1.61, CPK still up > 50K.  K 4.6.  Pt doing well, no c/o, UOP 3.7 L yesterday. IVF"s at 200 / hr.    Vitals:   06/28/16 1538 06/28/16 2106 06/29/16 0454 06/29/16 1348  BP: (!) 174/97 (!) 173/93 (!) 175/93 (!) 187/92  Pulse: 83 (!) 102 84 92  Resp: 20 20 20 20   Temp: 98 F (36.7 C) 98.1 F (36.7 C) 98 F (36.7 C) 98.9 F (37.2 C)  TempSrc: Oral Oral Oral Oral  SpO2: 91% 96% 96% 100%  Weight:      Height:        Inpatient medications: . amLODipine  10 mg Oral Daily  . insulin aspart  0-20 Units Subcutaneous TID WC  . insulin aspart  0-5 Units Subcutaneous QHS  . insulin detemir  15 Units Subcutaneous BID  . levothyroxine  100 mcg Oral QAC breakfast  . metoprolol tartrate  12.5 mg Oral BID  . polyethylene glycol  17 g Oral BID  . senna-docusate  1 tablet Oral BID  . sodium chloride flush  3 mL Intravenous Q12H   .  sodium bicarbonate  infusion 1000 mL 200 mL/hr at 06/29/16 1545   sodium chloride, acetaminophen **OR** acetaminophen, bisacodyl, HYDROcodone-acetaminophen, HYDROmorphone (DILAUDID) injection, ondansetron **OR** ondansetron (ZOFRAN) IV, oxyCODONE, sodium chloride flush  Exam: GEN: NAD, lying in bed, family at bedside HEENT EOMI, PERRL, MMM.  Some mild voice hoarseness NECK Supple, well-healed L-sided neck scar, no JVD PULM normal WOB, CTAB no c/w/r CV RRR no m/r/g ABD obese, mildly tender around umbilicus, well-healed midline surgical scar, no HSM, NABS EXT: no LE edema.  All compartments of the bilateral lower and upper extremities soft and compressible NEURO: AAO x 3.  R LE weak to plantar and dorsiflexion.  Able to flex both lower extremities at the hip.  5/5 strength right upper extremity.  L upper extremity weak to deltoid flexsion/ extension and biceps extension (although seems like limited by pain more than anything).    Assessment/Plan  1.  Acute kidney injury:  Due to rhabdo as  below.  Continue IVF's as he is making good urine (please avoid NSAIDs, IV contrast as able, mag citrate, and fleets enemas). Cr better, good UOP , CPK still > 50K.  Would keep fluids going probably until CPK is coming down.  All in all is doing very well from a renal standpoint.  Will follow at a distance over the w/e. Please call as needed.   2.  Rhabdomyolysis: unclear precipitant.  Pt has been on statin for a while.  Doesn't appear to have been down for a long time and this is corroborated by family.  Consider a myositis or inflammatory myopathy if no improvement after fluids and removal of statin.   3.  Mild hyperkalemia: resolved  4.  Mild metabolic acidosis: resolved  5. DM II : very poorly controlled.  Per primary  6.  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    Plan - as above   Kelly Splinter MD [facility] pager [phone]   06/29/2016, 5:33 PM    Recent Labs Lab 06/28/16 0544 06/28/16 1219 06/29/16 0537  NA 140 142 143  K 6.5* 5.4* 4.6  CL 113* 110 104  CO2 19* 23 29  GLUCOSE 181* 204* 127*  BUN 52* 48* 43*  CREATININE 2.13* 2.02* 1.61*  CALCIUM 8.1* 8.3* 8.7*  Recent Labs Lab 06/28/16 0544 06/29/16 0537  AST 1,015* 861*  ALT 220* 230*  ALKPHOS 46 44  BILITOT 0.7 1.0  PROT 6.8 6.6  ALBUMIN 3.2* 3.0*    Recent Labs Lab 06/27/16 0903 06/28/16 0544 06/29/16 0537  WBC 10.2 11.0* 10.4  NEUTROABS 7.0  --   --   HGB 13.0 13.0 12.8*  HCT 36.5* 36.8* 35.6*  MCV 78.8 79.8 78.1  PLT 228 262 240   Iron/TIBC/Ferritin/ %Sat    Component Value Date/Time   IRON 39 (L) 09/28/2014 1615   TIBC 285 09/28/2014 1615   FERRITIN 201 01/31/2015 1247   IRONPCTSAT 14 (L) 09/28/2014 1615

## 2016-06-29 NOTE — Progress Notes (Signed)
Patient and family accepted bed offer to [facility] in [place].  CSW will continue to follow and assist with patient disposition, once medically stable.

## 2016-06-30 DIAGNOSIS — E1165 Type 2 diabetes mellitus with hyperglycemia: Secondary | ICD-10-CM

## 2016-06-30 DIAGNOSIS — E875 Hyperkalemia: Secondary | ICD-10-CM

## 2016-06-30 LAB — COXSACKIE B VIRUS ANTIBODIES
COXSACKIE B4 AB: NEGATIVE
Coxsackie B1 Ab: 1:8 {titer} — ABNORMAL HIGH
Coxsackie B2 Ab: NEGATIVE
Coxsackie B3 Ab: NEGATIVE

## 2016-06-30 LAB — GLUCOSE, CAPILLARY
GLUCOSE-CAPILLARY: 102 mg/dL — AB (ref 65–99)
GLUCOSE-CAPILLARY: 92 mg/dL (ref 65–99)
Glucose-Capillary: 151 mg/dL — ABNORMAL HIGH (ref 65–99)
Glucose-Capillary: 277 mg/dL — ABNORMAL HIGH (ref 65–99)

## 2016-06-30 LAB — COMPREHENSIVE METABOLIC PANEL
ALBUMIN: 3.1 g/dL — AB (ref 3.5–5.0)
ALT: 267 U/L — AB (ref 17–63)
AST: 900 U/L — AB (ref 15–41)
Alkaline Phosphatase: 48 U/L (ref 38–126)
Anion gap: 13 (ref 5–15)
BUN: 42 mg/dL — AB (ref 6–20)
CHLORIDE: 97 mmol/L — AB (ref 101–111)
CO2: 33 mmol/L — AB (ref 22–32)
CREATININE: 1.51 mg/dL — AB (ref 0.61–1.24)
Calcium: 8.9 mg/dL (ref 8.9–10.3)
GFR calc Af Amer: 54 mL/min — ABNORMAL LOW (ref >60)
GFR, EST NON AFRICAN AMERICAN: 47 mL/min — AB (ref >60)
Glucose, Bld: 171 mg/dL — ABNORMAL HIGH (ref 65–99)
POTASSIUM: 4.4 mmol/L (ref 3.5–5.1)
SODIUM: 143 mmol/L (ref 135–145)
Total Bilirubin: 0.8 mg/dL (ref 0.3–1.2)
Total Protein: 6.8 g/dL (ref 6.5–8.1)

## 2016-06-30 LAB — ANGIOTENSIN CONVERTING ENZYME

## 2016-06-30 LAB — CK

## 2016-06-30 MED ORDER — SODIUM CHLORIDE 0.9 % IV BOLUS (SEPSIS)
1000.0000 mL | Freq: Once | INTRAVENOUS | Status: AC
  Administered 2016-06-30: 1000 mL via INTRAVENOUS

## 2016-06-30 MED ORDER — SODIUM CHLORIDE 0.9 % IV SOLN
INTRAVENOUS | Status: AC
  Administered 2016-06-30: 13:00:00 via INTRAVENOUS

## 2016-06-30 NOTE — Progress Notes (Signed)
Chaplain visit the result of a [place]. Derrick [name] is depressed about his diagnosis of uncontrolled diabetes and back pain. He is concerned with his inability to stand or care for himself. His family is surrounding him providing emotional and loving care. He asked for and received prayer.  [name]. [name], [place]

## 2016-06-30 NOTE — Progress Notes (Signed)
Patient has been refusing nocturnal CPAP/BiPAP. Order discontinued per RT protocol.

## 2016-06-30 NOTE — Progress Notes (Signed)
   Subjective:    Pt lying in bed in no acute distress Denies any new symptoms or issues Still c/o mild weakness in bilateral legs   Patient reports pain as mild.  Objective:   VITALS:   Vitals:   06/29/16 2137 06/30/16 0435  BP: (!) 141/80 (!) 152/89  Pulse: (!) 110 89  Resp: 18 18  Temp: 98.4 F (36.9 C) 98.6 F (37 C)    Bilateral lower extremities nv intact Limited strength trying to lift his legs Mild edema bilaterally distally  LABS  Recent Labs  06/27/16 0903 06/28/16 0544 06/29/16 0537  HGB 13.0 13.0 12.8*  HCT 36.5* 36.8* 35.6*  WBC 10.2 11.0* 10.4  PLT 228 262 240     Recent Labs  06/28/16 1219 06/29/16 0537 06/30/16 0521  NA 142 143 143  K 5.4* 4.6 4.4  BUN 48* 43* 42*  CREATININE 2.02* 1.61* 1.51*  GLUCOSE 204* 127* 171*     Assessment/Plan:    Continue current treatment Continue OT Plan for SNF once medically stable    Brad Dixon, MPAS, PA-C  06/30/2016, 8:11 AM

## 2016-06-30 NOTE — Progress Notes (Signed)
[facility] PROGRESS NOTE    Progress Note  Derrick [name]  [id number] DOB: [birth year] DOA: 07/02/2016 PCP: [name], DO     Brief Narrative:   Derrick [name] is an 65 y.o. male past medical history of diabetes mellitus with last A1c of 12, hypertension morbid obesity history of colorectal cancer, CVA on Plavix started having back pain about 4 days prior to admission went to [facility] ED is and was discharged, CT scan of the lumbar spine showed disc herniation with compression of S1 nerve root.   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  Acute kidney injury secondary to rhabdomyolysis: Baseline creatinine 1.3, on admission creatinine 2.0, CK cont to be > 50,000. Cr today 1.5 Cont IV fluids his creatinine is slowly improving. Renal was consulted and appreciate assistance. Follow B-met slowly.  Rhabdomyolysis: Questionable etiology medication Norvasc interaction with statin versus viral versus myositis. Appreciate renal's assistance, CK continues to be greater than 50,000.  Nephrology was consulted and recommended biopsy. Surgery consulted they recommended possible biopsy as an outpatient.  Elevated LFTs: Likely due to rhabdomyolysis, statins have been discontinued.   Essential hypertension: Continue current regimen.  Diabetes mellitus type 2 with an A1c of 12. Continue Levemir plus sliding scale insulin  Hyperkalemia: Likely due to acute renal failure and rhabdomyolysis  Resolved with Kayexalate.  CAD S/P percutaneous coronary angioplasty continue metoprolol.  Obstructive sleep apnea  C-pap QHS   DVT prophylaxis: lovenox Family Communication:none Disposition Plan/Barrier to D/C: unable to determine Code Status:     Code Status Orders        Start     Ordered   07/06/2016 1645  Full code  Continuous     07/13/2016 1652    Code Status  History    Date Active Date Inactive Code Status Order ID Comments User Context   06/19/2016  4:52 PM 07/14/2016  5:22 PM Full Code [id number]  Ardeen Jourdain, PA-C Inpatient   04/11/2016  4:32 PM 04/12/2016  2:34 PM Full Code [id number]  [name], MD Inpatient   12/01/2015  6:28 PM 12/13/2015  2:33 PM Full Code [id number]  Cathlyn Parsons, PA-C Inpatient   11/25/2015  9:36 PM 12/01/2015  6:28 PM Full Code [id number]  Wallie Char Inpatient   08/01/2015 11:23 PM 08/04/2015  5:32 PM Full Code [id number]  Orvan Falconer, MD Inpatient   09/06/2014  5:06 PM 09/11/2014  1:01 PM Full Code [id number]  [name], MD Inpatient        IV Access:    Peripheral IV   Procedures and diagnostic studies:   No results found.   Medical Consultants:    None.  Anti-Infectives:   None  Subjective:    Derrick [name] No complains feels good.  Objective:    Vitals:   06/29/16 0454 06/29/16 1348 06/29/16 2137 06/30/16 0435  BP: (!) 175/93 (!) 187/92 (!) 141/80 (!) 152/89  Pulse: 84 92 (!) 110 89  Resp: _0 Temp: 98 F (36.7 C) 98.9 F (37.2 C) 98.4 F (36.9 C) 98.6 F (37 C)  TempSrc: Oral Oral Oral Oral  SpO2: 96% 100% 95% 96%  Weight:   124.3 kg (274 lb 0.5 oz) 125.3 kg (276 lb 3.8 oz)  Height:        Intake/Output Summary (Last 24 hours) at 06/30/16 0915 Last data filed at 06/30/16 [phone]  Gross per 24 hour  Intake             4390 ml  Output             3675 ml  Net              715 ml   Filed Weights   07/14/2016 1752 06/29/16 2137 06/30/16 0435  Weight: 123.7 kg (272 lb 11.3 oz) 124.3 kg (274 lb 0.5 oz) 125.3 kg (276 lb 3.8 oz)    Exam: General exam: In no acute distress. Respiratory system: Good air movement and clear to auscultation. Cardiovascular system: S1 & S2 heard, RRR. No JVD. Gastrointestinal system: Abdomen is nondistended, soft and nontender.  Extremities: No pedal edema. Skin: No rashes, lesions or ulcers Psychiatry: Judgement and insight appear  normal. Mood & affect appropriate.    Data Reviewed:    Labs: Basic Metabolic Panel:  Recent Labs Lab 06/27/16 0903 06/28/16 0544 06/28/16 1219 06/29/16 0537 06/30/16 0521  NA 137 140 142 143 143  K 5.3* 6.5* 5.4* 4.6 4.4  CL 106 113* 110 104 97*  CO2 21* 19* 23 29 33*  GLUCOSE 181* 181* 204* 127* 171*  BUN 46* 52* 48* 43* 42*  CREATININE 2.03* 2.13* 2.02* 1.61* 1.51*  CALCIUM 8.4* 8.1* 8.3* 8.7* 8.9   GFR Estimated Creatinine Clearance: 62.9 mL/min (by C-G formula based on SCr of 1.51 mg/dL (H)). Liver Function Tests:  Recent Labs Lab 06/28/16 0544 06/29/16 0537 06/30/16 0521  AST 1,015* 861* 900*  ALT 220* 230* 267*  ALKPHOS 46 44 48  BILITOT 0.7 1.0 0.8  PROT 6.8 6.6 6.8  ALBUMIN 3.2* 3.0* 3.1*   No results for input(s): LIPASE, AMYLASE in the last 168 hours. No results for input(s): AMMONIA in the last 168 hours. Coagulation profile  Recent Labs Lab 06/27/16 0903  INR 0.98    CBC:  Recent Labs Lab 06/27/16 0903 06/28/16 0544 06/29/16 0537  WBC 10.2 11.0* 10.4  NEUTROABS 7.0  --   --   HGB 13.0 13.0 12.8*  HCT 36.5* 36.8* 35.6*  MCV 78.8 79.8 78.1  PLT 228 262 240   Cardiac Enzymes:  Recent Labs Lab 06/27/16 0903 06/28/16 0544 06/29/16 0537 06/30/16 0521  CKTOTAL >50,000* >50,000* >50,000* >50,000*   BNP (last 3 results) No results for input(s): PROBNP in the last 8760 hours. CBG:  Recent Labs Lab 06/29/16 0800 06/29/16 1141 06/29/16 1711 06/29/16 2139 06/30/16 0754  GLUCAP 144* 148* 115* 181* 151*   D-Dimer: No results for input(s): DDIMER in the last 72 hours. Hgb A1c: No results for input(s): HGBA1C in the last 72 hours. Lipid Profile: No results for input(s): CHOL, HDL, LDLCALC, TRIG, CHOLHDL, LDLDIRECT in the last 72 hours. Thyroid function studies:  Recent Labs  06/28/16 1706  TSH 9.171*   Anemia work up: No results for input(s): VITAMINB12, FOLATE, FERRITIN, TIBC, IRON, RETICCTPCT in the last 72  hours. Sepsis Labs:  Recent Labs Lab 06/27/16 0903 06/28/16 0544 06/29/16 0537  WBC 10.2 11.0* 10.4   Microbiology Recent Results (from the past 240 hour(s))  Surgical PCR screen     Status: None   Collection Time: 07/11/2016  6:00 PM  Result Value Ref Range Status   MRSA, PCR NEGATIVE NEGATIVE Final   Staphylococcus aureus NEGATIVE NEGATIVE Final    Comment:        The Xpert SA Assay (FDA approved for NASAL specimens in patients over 88 years of age), is one component of a  comprehensive surveillance program.  Test performance has been validated by [facility] for patients greater than or equal to 79 year old. It is not intended to diagnose infection nor to guide or monitor treatment.   Culture, Urine     Status: None   Collection Time: 06/27/16  3:14 PM  Result Value Ref Range Status   Specimen Description URINE, RANDOM  Final   Special Requests NONE  Final   Culture NO GROWTH Performed at [facility]   Final   Report Status 06/29/2016 FINAL  Final     Medications:   . amLODipine  10 mg Oral Daily  . insulin aspart  0-20 Units Subcutaneous TID WC  . insulin aspart  0-5 Units Subcutaneous QHS  . insulin detemir  15 Units Subcutaneous BID  . levothyroxine  100 mcg Oral QAC breakfast  . metoprolol tartrate  12.5 mg Oral BID  . polyethylene glycol  17 g Oral BID  . senna-docusate  1 tablet Oral BID  . sodium chloride flush  3 mL Intravenous Q12H   Continuous Infusions: .  sodium bicarbonate  infusion 1000 mL 200 mL/hr at 06/30/16 0735    Time spent: 15 min   LOS: 4 days   Charlynne Cousins  [facility] Pager [phone]  *Please refer to [email or web address], password [email or web address] to get updated schedule on who will round on this patient, as hospitalists switch teams weekly. If 7PM-7AM, please contact night-coverage at [email or web address], password [email or web address] for any overnight needs.  06/30/2016, 9:15 AM

## 2016-07-01 LAB — COMPREHENSIVE METABOLIC PANEL
ALBUMIN: 3.4 g/dL — AB (ref 3.5–5.0)
ALK PHOS: 56 U/L (ref 38–126)
ALT: 314 U/L — AB (ref 17–63)
ANION GAP: 13 (ref 5–15)
AST: 1007 U/L — AB (ref 15–41)
BILIRUBIN TOTAL: 1 mg/dL (ref 0.3–1.2)
BUN: 54 mg/dL — AB (ref 6–20)
CALCIUM: 9.1 mg/dL (ref 8.9–10.3)
CO2: 26 mmol/L (ref 22–32)
Chloride: 104 mmol/L (ref 101–111)
Creatinine, Ser: 1.61 mg/dL — ABNORMAL HIGH (ref 0.61–1.24)
GFR calc Af Amer: 50 mL/min — ABNORMAL LOW (ref >60)
GFR calc non Af Amer: 43 mL/min — ABNORMAL LOW (ref >60)
GLUCOSE: 80 mg/dL (ref 65–99)
Potassium: 5.3 mmol/L — ABNORMAL HIGH (ref 3.5–5.1)
SODIUM: 143 mmol/L (ref 135–145)
TOTAL PROTEIN: 7.3 g/dL (ref 6.5–8.1)

## 2016-07-01 LAB — GLUCOSE, CAPILLARY
GLUCOSE-CAPILLARY: 227 mg/dL — AB (ref 65–99)
Glucose-Capillary: 87 mg/dL (ref 65–99)
Glucose-Capillary: 138 mg/dL — ABNORMAL HIGH (ref 65–99)
Glucose-Capillary: 108 mg/dL — ABNORMAL HIGH (ref 65–99)

## 2016-07-01 LAB — CK: Total CK: 40113 U/L — ABNORMAL HIGH (ref 49–397)

## 2016-07-01 LAB — POTASSIUM
Potassium: 5.9 mmol/L — ABNORMAL HIGH (ref 3.5–5.1)
Potassium: 5.5 mmol/L — ABNORMAL HIGH (ref 3.5–5.1)

## 2016-07-01 MED ORDER — SODIUM POLYSTYRENE SULFONATE 15 GM/60ML PO SUSP
60.0000 g | Freq: Once | ORAL | Status: AC
Start: 2016-07-01 — End: 2016-07-01
  Administered 2016-07-01: 60 g via ORAL
  Filled 2016-07-01: qty 240

## 2016-07-01 MED ORDER — SODIUM POLYSTYRENE SULFONATE 15 GM/60ML PO SUSP: 45.0000 g | Freq: Once | ORAL | Status: DC

## 2016-07-01 NOTE — Progress Notes (Signed)
Physical Therapy Treatment Patient Details Name: Derrick [name] MRN: [id number] DOB: Jun 02, 1951 Today's Date: 07/01/2016    History of Present Illness HPI:           Derrick [name] is an 65 y.o. male with  PMH significant for poorly controlled DM II , HTN, HLD, CAD, acute stroke in May 2017 on Plavix, OSA, and DDD . Presented to [facility],  There, CT of spine showed disc protrusion at L5-S1.  He was ultimately discharged to outpatient followup.  The night after ED discharge, he fell at home.    When he arrived at Dr. Charlestine Night office for outpatient followup, he was unable to walk.  Was admitted to [facility] and difficulty ambulating due to pain and weakness. patient was admitted to hospital and   found to have an acute kidney injury.   He also has significant spinal stenosis at T12 but unfortunately is unable to obtain MRI due to body habitus.  The reason for weakness is unknown at present. Initially suspected possibly due to statin/ myopathy. Further workup is pending.                                                                                                                            PT Comments    Nursing reported they assisted pt to recliner earlier today + 2 but pt was able to stand and pivot enough to support his own weight.  When arrived to room, pt was back in bed assisted by the family.  Family reports pt "can not stand and can not walk".  When I asked how they got him back to bed, the family stated they "picked him up and it took 4 of [place]".  Family increasingly upset of my presence and why Physical Therapy is treating him.  "He can't walk".  Family wanted to know who ordered this.  I really couldn't disclose too much information due to HIPPA and so many family members in the room, I was not sure who was who.  Family asking about his medical issues and stated "no one knows what's going on".  Family upset "Ya'll don't know what is going on".   I was only able to sit pt EOB then back to  supine with minimal effort from pt.    Follow Up Recommendations  SNF     Equipment Recommendations       Recommendations for Other Services       Precautions / Restrictions Precautions Precautions: Fall Restrictions Weight Bearing Restrictions: No    Mobility  Bed Mobility Overal bed mobility: Needs Assistance Bed Mobility: Rolling;Supine to Sit;Sidelying to Sit Rolling: Max assist Sidelying to sit: Max assist;Total assist Supine to sit: Max assist;Total assist     General bed mobility comments: pt required much assist to perform all bed mobility.  sitting EOB pt had difficulty self maintaining present with x 1 posterior LOB and heavy use rail.    Transfers  General transfer comment: attempted x 2 assist and 2 attempts however pt was unable to clear hips off bed.  Nursing report they assisted him to recliner earlier today + 2 and pt was able to stand and pivot.  Family reports pt is not able to stand and pt is not able to walk.    Ambulation/Gait                 Stairs            Wheelchair Mobility    Modified Rankin (Stroke Patients Only)       Balance                                    Cognition Arousal/Alertness: Awake/alert (slow) Behavior During Therapy: WFL for tasks assessed/performed Overall Cognitive Status: Within Functional Limits for tasks assessed                 General Comments: required repeat VC's and required increased time to process    Exercises      General Comments        Pertinent Vitals/Pain Pain Assessment: 0-10 Pain Score: 5  Pain Location: low back Pain Descriptors / Indicators: Grimacing;Discomfort Pain Intervention(s): Monitored during session;Repositioned;Premedicated before session    Home Living                      Prior Function            PT Goals (current goals can now be found in the care plan section) Progress towards PT goals: Progressing  toward goals    Frequency    Min 3X/week      PT Plan Current plan remains appropriate    Co-evaluation             End of Session   Activity Tolerance: Patient limited by fatigue Patient left: in bed     Time: [id number] PT Time Calculation (min) (ACUTE ONLY): 30 min  Charges:  $Therapeutic Activity: 23-37 mins                    G Codes:      Rica Koyanagi  PTA [facility]  Acute  Rehab Pager      [phone]

## 2016-07-01 NOTE — Progress Notes (Signed)
Have d/w Dr Grandville Silos, creat stable mid 1's still.  K+ up from rhabdo, getting kayexalate, no renal diet.  Would cont IVF's (as bicarb, or NS if serum CO2 goes too high) until CPK is coming down under 20K then can cut back on IVF's. Can dc fluids when CPK < 5- 10K.  Will sign off, please call w any questions.   Kelly Splinter MD [employer] pgr [phone]   07/01/2016, 12:34 PM

## 2016-07-01 NOTE — Progress Notes (Signed)
     Subjective: Pt sitting in the recliner in no acute distress Denies any new symptoms or issues Still c/o mild weakness in bilateral legs Patient reports pain as mild.   Objective:   VITALS:   Vitals:   06/30/16 2050 07/01/16 0413  BP: (!) 154/96 (!) 165/94  Pulse: 84 (!) 113  Resp: 18 20  Temp: 98.4 F (36.9 C) 97.8 F (36.6 C)    Bilateral lower extremities nv intact Limited strength trying to lift his legs Mild edema bilaterally distally  LABS  Recent Labs  06/29/16 0537  HGB 12.8*  HCT 35.6*  WBC 10.4  PLT 240     Recent Labs  06/29/16 0537 06/30/16 0521 07/01/16 0540  NA 143 143 143  K 4.6 4.4 5.3*  BUN 43* 42* 54*  CREATININE 1.61* 1.51* 1.61*  GLUCOSE 127* 171* 80     Assessment/Plan:    Continue current treatment Continue OT Plan for SNF once medically stable Pt states that the plan is for him to have surgery by Dr. Gladstone Lighter once his symptoms have cleared.     Derrick [name]   PAC  07/01/2016, 9:49 AM

## 2016-07-01 NOTE — Clinical Social Work Note (Signed)
Patient is not medically stable for discharge. Patient has bed at [facility] once stable.   MSW remains available as needed.   Glendon Axe, MSW [phone] 07/01/2016 2:36 PM

## 2016-07-01 NOTE — Progress Notes (Signed)
Consult /PROGRESS SKANDA DOUB Pennel  [id number] DOB: 11/26/1950 DOA: 07/12/2016 PCP: [name], DO    Brief Narrative:  Derrick [name] is an 65 y.o. 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 the day prior to admission, CT of the lumbar spine at that time for 5 disc herniation with compression of the S1 nerve was treated with IV medication and discharged home to follow-up with you as an outpatient. At that time he was started on Percocet and prednisone. She denies any lost control of his bowel regimen. He relates he continues to have pain. He saw Dr. Rachel Bo in the office decided to admit him and has consulted [place] for management of his medical problems. Patient was seen and admitted total CK obtained was greater 50,000 and patient was noted to be in acute renal failure. It was noted that patient likely had rhabdomyolysis and nephrology was consulted due to worsening renal function and rhabdomyolysis. Patient has been aggressively hydrated with good urine output and some improvement with total CK currently at 40,113. 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 Patient's Plavix has been held again in anticipation of muscle biopsy per general surgery early next week.  Total CK greater 50,000 on admission and now trending down and currently at 40,113. Statin has been discontinued. IV fluids. PT/OT. Per primary team.  #2 acute renal failure Basic metabolic profile obtained 06/28/2016 with a creatinine of 2.03. Creatinine was 1.12 04/12/2016. Creatinine worsening and was 2.13 06/28/2016. Likely secondary to rhabdomyolysis. Discontinued diuretics and ACE inhibitor. Renal ultrasound negative for hydronephrosis. Patient with good urine output of 4.500 L over the past 24 hours. Total CK level greater than 50,000 on admission and now at 40,113. Concern for rhabdomyolysis-induced acute renal failure. Patient was aggressively hydrated with IV fluids on a bicarbonate drip, renal function seems to plateaued with creatinine now at 1.61.[name] With goal urine output of 2.5 L per day. Check daily CKs. Nephrology following and appreciate input and recommendations.   #3 myalgias/rhabdomyolysis Questionable etiology. May be medication induced secondary to interaction between Norvasc and patient's statin vs secondary to fall versus myositis vs autoimmune. Total CK ordered by orthopedics 06/27/2016 greater than 50,000. Patient was given a 3 L bolus of IV fluids and fluid rate increased to 200 mL per hour with goal urine output of 2.5 L per day. Total CK today 07/01/2016 still greater than 40,113. Renal function seems to be plateauing. Patient currently on a bicarbonate drip at 200 mL per hour. Continue current fluid rate until total CK is less than 20,000 and a  could decrease rate on IV fluids. Likely discontinue IV fluids once total CK level is less than 5-10,000 as per recommendations from nephrology. Neurology has been consulted by primary team and concern for viral  myositis vs autoimmune etiology and recommended muscle biopsy versus EMG(outpatient procedure). Patient's Plavix on hold. Patient has been seen by general surgery and planning for muscle biopsy hopefully this coming week. Daily total CKs. Daily comprehensive metabolic profiles. Nephrology ff and appreciate input and recommendations.   #4 transaminitis Likely secondary to rhabdomyolysis. Patient's statin has been discontinued. Continue aggressive IV fluid hydration. Follow.  #5 hyperkalemia Likely secondary to rhabdomyolysis and possible acute renal failure. Patient currently on a renal diet. Patient given a dose of Kayexalate. Repeat potassium level is 5.5. Will give another dose of Kayexalate. Continue current IV fluids at current rate. Follow.  #6 hypertension Blood pressure stable. Continue Lopressor and Norvasc.  #7 diabetes mellitus Hemoglobin A1c 12.3 on 05/25/2016. CBGs have ranged from 138-227. Continue current dose of Levemir, meal coverage insulin, sliding scale insulin.  #8 coronary artery disease status post percutaneous coronary angioplasty Plavix on hold in anticipation of muscle biopsy early this coming week. Continue metoprolol.  #9 OSA CPAP QHS.  #10 hypothyroidism Patient noted to be diaphoretic. TSH elevated at 9.171. Synthroid dose has been increased to 100 MCG's daily. Patient will need repeat thyroid function studies done in about 4-6 weeks.     DVT prophylaxis: SCDs Code Status: Full Family Communication: Updated patient. No family present. Disposition Plan: Per primary team.   Consultants:   [facility] Dr. Oneida Arenas 06/22/2016  Nephrology: Dr. Hollie Salk 06/27/2016  Neurology: Dr Aram Beecham 06/28/2016  Gen. surgery: Dr. Zella Richer 06/28/2016  Procedures:   Renal ultrasound 06/27/2016  MRI of the C and T-spine pending 06/27/2016  Antimicrobials:   None   Subjective: Patient states no significant improvement with his lower extremity  weakness since admission. Patient also complaining of upper extremity pain.  Denies any chest pain. No shortness of breath. Patient states has good urine output. Patient with some myalgias upper extremities.  Objective: Vitals:   06/30/16 1223 06/30/16 1500 06/30/16 2050 07/01/16 0413  BP: 140/85 (!) 156/95 (!) 154/96 (!) 165/94  Pulse: 77 (!) 107 84 (!) 113  Resp: 20 20 18 20   Temp: 98.6 F (37 C) 98.1 F (36.7 C) 98.4 F (36.9 C) 97.8 F (36.6 C)  TempSrc: Oral Oral Oral Oral  SpO2: 96% 98% 93% 93%  Weight:    123.2 kg (271 lb 9.7 oz)  Height:        Intake/Output Summary (Last 24 hours) at 07/01/16 1351 Last data filed at 07/01/16 0851  Gross per 24 hour  Intake             1960 ml  Output             2400 ml  Net             -440 ml   Filed Weights   06/29/16 2137 06/30/16 0435 07/01/16 0413  Weight: 124.3 kg (274 lb 0.5 oz) 125.3 kg (276 lb 3.8 oz) 123.2 kg (271 lb 9.7 oz)    Examination:  General exam: Sitting up in bed. Respiratory system: Clear to auscultation. Respiratory effort normal. Cardiovascular system: S1 & S2 heard, RRR. No JVD, murmurs, rubs, gallops or clicks. No pedal edema. Gastrointestinal system: Abdomen is mildly distended, soft and nontender. No organomegaly or masses felt. Normal bowel sounds heard. Central nervous system: Alert and oriented. No focal  neurological deficits. Extremities: 2-3/5 BLE strength Skin: No rashes, lesions or ulcers Psychiatry: Judgement and insight appear normal. Mood & affect appropriate.     Data Reviewed: I have personally reviewed following labs and imaging studies  CBC:  Recent Labs Lab 06/27/16 0903 06/28/16 0544 06/29/16 0537  WBC 10.2 11.0* 10.4  NEUTROABS 7.0  --   --   HGB 13.0 13.0 12.8*  HCT 36.5* 36.8* 35.6*  MCV 78.8 79.8 78.1  PLT 228 262 [id number]   Basic Metabolic Panel:  Recent Labs Lab 06/28/16 0544 06/28/16 1219 06/29/16 0537 06/30/16 0521 07/01/16 0540 07/01/16 0848  NA 140 142 143  143 143  --   K 6.5* 5.4* 4.6 4.4 5.3* 5.9*  CL 113* 110 104 97* 104  --   CO2 19* 23 29 33* 26  --   GLUCOSE 181* 204* 127* 171* 80  --   BUN 52* 48* 43* 42* 54*  --   CREATININE 2.13* 2.02* 1.61* 1.51* 1.61*  --   CALCIUM 8.1* 8.3* 8.7* 8.9 9.1  --    GFR: Estimated Creatinine Clearance: 58.4 mL/min (by C-G formula based on SCr of 1.61 mg/dL (H)). Liver Function Tests:  Recent Labs Lab 06/28/16 0544 06/29/16 0537 06/30/16 0521 07/01/16 0540  AST 1,015* 861* 900* 1,007*  ALT 220* 230* 267* 314*  ALKPHOS 46 44 48 56  BILITOT 0.7 1.0 0.8 1.0  PROT 6.8 6.6 6.8 7.3  ALBUMIN 3.2* 3.0* 3.1* 3.4*   No results for input(s): LIPASE, AMYLASE in the last 168 hours. No results for input(s): AMMONIA in the last 168 hours. Coagulation Profile:  Recent Labs Lab 06/27/16 0903  INR 0.98   Cardiac Enzymes:  Recent Labs Lab 06/27/16 0903 06/28/16 0544 06/29/16 0537 06/30/16 0521 07/01/16 0540  CKTOTAL >50,000* >50,000* >50,000* >50,000* 40,113*   BNP (last 3 results) No results for input(s): PROBNP in the last 8760 hours. HbA1C: No results for input(s): HGBA1C in the last 72 hours. CBG:  Recent Labs Lab 06/30/16 1212 06/30/16 1634 06/30/16 2007 07/01/16 0730 07/01/16 1234  GLUCAP 277* 102* 92 87 227*   Lipid Profile: No results for input(s): CHOL, HDL, LDLCALC, TRIG, CHOLHDL, LDLDIRECT in the last 72 hours. Thyroid Function Tests:  Recent Labs  06/28/16 1706  TSH 9.171*   Anemia Panel: No results for input(s): VITAMINB12, FOLATE, FERRITIN, TIBC, IRON, RETICCTPCT in the last 72 hours. Sepsis Labs: No results for input(s): PROCALCITON, LATICACIDVEN in the last 168 hours.  Recent Results (from the past 240 hour(s))  Surgical PCR screen     Status: None   Collection Time: 07/12/2016  6:00 PM  Result Value Ref Range Status   MRSA, PCR NEGATIVE NEGATIVE Final   Staphylococcus aureus NEGATIVE NEGATIVE Final    Comment:        The Xpert SA Assay (FDA approved  for NASAL specimens in patients over 61 years of age), is one component of a comprehensive surveillance program.  Test performance has been validated by [facility] for patients greater than or equal to 38 year old. It is not intended to diagnose infection nor to guide or monitor treatment.   Culture, Urine     Status: None   Collection Time: 06/27/16  3:14 PM  Result Value Ref Range Status   Specimen Description URINE, RANDOM  Final   Special Requests NONE  Final   Culture NO GROWTH Performed at [facility]   Final   Report Status 06/29/2016 FINAL  Final  Radiology Studies: No results found.      Scheduled Meds: . amLODipine  10 mg Oral Daily  . insulin aspart  0-20 Units Subcutaneous TID WC  . insulin aspart  0-5 Units Subcutaneous QHS  . insulin detemir  15 Units Subcutaneous BID  . levothyroxine  100 mcg Oral QAC breakfast  . metoprolol tartrate  12.5 mg Oral BID  . polyethylene glycol  17 g Oral BID  . senna-docusate  1 tablet Oral BID   Continuous Infusions: .  sodium bicarbonate  infusion 1000 mL 200 mL/hr at 06/30/16 0735     LOS: 5 days    Time spent: 20 mins    Yesica Kemler, MD [facility] Pager [phone]  If 7PM-7AM, please contact night-coverage [email or web address] Password [email or web address] 07/01/2016, 1:51 PM

## 2016-07-02 ENCOUNTER — Inpatient Hospital Stay (HOSPITAL_COMMUNITY): Payer: Medicare Other

## 2016-07-02 DIAGNOSIS — I10 Essential (primary) hypertension: Secondary | ICD-10-CM

## 2016-07-02 DIAGNOSIS — M6282 Rhabdomyolysis: Principal | ICD-10-CM

## 2016-07-02 DIAGNOSIS — I2510 Atherosclerotic heart disease of native coronary artery without angina pectoris: Secondary | ICD-10-CM

## 2016-07-02 DIAGNOSIS — Z9861 Coronary angioplasty status: Secondary | ICD-10-CM

## 2016-07-02 LAB — BASIC METABOLIC PANEL
Anion gap: 17 — ABNORMAL HIGH (ref 5–15)
BUN: 92 mg/dL — AB (ref 6–20)
CHLORIDE: 96 mmol/L — AB (ref 101–111)
CO2: 28 mmol/L (ref 22–32)
CREATININE: 4.7 mg/dL — AB (ref 0.61–1.24)
Calcium: 8 mg/dL — ABNORMAL LOW (ref 8.9–10.3)
GFR, EST AFRICAN AMERICAN: 14 mL/min — AB (ref >60)
GFR, EST NON AFRICAN AMERICAN: 12 mL/min — AB (ref >60)
Glucose, Bld: 74 mg/dL (ref 65–99)
POTASSIUM: 5.1 mmol/L (ref 3.5–5.1)
SODIUM: 141 mmol/L (ref 135–145)

## 2016-07-02 LAB — COMPREHENSIVE METABOLIC PANEL
ALT: 323 U/L — AB (ref 17–63)
ANION GAP: 17 — AB (ref 5–15)
AST: 838 U/L — ABNORMAL HIGH (ref 15–41)
Albumin: 2.9 g/dL — ABNORMAL LOW (ref 3.5–5.0)
Alkaline Phosphatase: 51 U/L (ref 38–126)
BUN: 93 mg/dL — ABNORMAL HIGH (ref 6–20)
CHLORIDE: 101 mmol/L (ref 101–111)
CO2: 24 mmol/L (ref 22–32)
Calcium: 8.5 mg/dL — ABNORMAL LOW (ref 8.9–10.3)
Creatinine, Ser: 3.66 mg/dL — ABNORMAL HIGH (ref 0.61–1.24)
GFR, EST AFRICAN AMERICAN: 19 mL/min — AB (ref >60)
GFR, EST NON AFRICAN AMERICAN: 16 mL/min — AB (ref >60)
Glucose, Bld: 169 mg/dL — ABNORMAL HIGH (ref 65–99)
POTASSIUM: 6.1 mmol/L — AB (ref 3.5–5.1)
SODIUM: 142 mmol/L (ref 135–145)
Total Bilirubin: 1 mg/dL (ref 0.3–1.2)
Total Protein: 6.7 g/dL (ref 6.5–8.1)

## 2016-07-02 LAB — GLUCOSE, CAPILLARY
GLUCOSE-CAPILLARY: 72 mg/dL (ref 65–99)
GLUCOSE-CAPILLARY: 161 mg/dL — AB (ref 65–99)
Glucose-Capillary: 172 mg/dL — ABNORMAL HIGH (ref 65–99)
Glucose-Capillary: 183 mg/dL — ABNORMAL HIGH (ref 65–99)

## 2016-07-02 LAB — CBC
HEMATOCRIT: 37.8 % — AB (ref 39.0–52.0)
HEMOGLOBIN: 13.2 g/dL (ref 13.0–17.0)
MCH: 27.5 pg (ref 26.0–34.0)
MCHC: 34.9 g/dL (ref 30.0–36.0)
MCV: 78.8 fL (ref 78.0–100.0)
Platelets: 343 10*3/uL (ref 150–400)
RBC: 4.8 MIL/uL (ref 4.22–5.81)
RDW: 14.6 % (ref 11.5–15.5)
WBC: 13.1 10*3/uL — AB (ref 4.0–10.5)

## 2016-07-02 LAB — MAGNESIUM: MAGNESIUM: 2.6 mg/dL — AB (ref 1.7–2.4)

## 2016-07-02 LAB — CK: CK TOTAL: 47040 U/L — AB (ref 49–397)

## 2016-07-02 MED ORDER — LEVALBUTEROL HCL 1.25 MG/0.5ML IN NEBU
1.2500 mg | INHALATION_SOLUTION | Freq: Once | RESPIRATORY_TRACT | Status: AC
  Administered 2016-07-02: 1.25 mg via RESPIRATORY_TRACT
  Filled 2016-07-02 (×2): qty 0.5

## 2016-07-02 MED ORDER — DEXTROSE 50 % IV SOLN
1.0000 | Freq: Once | INTRAVENOUS | Status: AC
  Administered 2016-07-02: 50 mL via INTRAVENOUS
  Filled 2016-07-02: qty 50

## 2016-07-02 MED ORDER — SODIUM POLYSTYRENE SULFONATE 15 GM/60ML PO SUSP
60.0000 g | Freq: Once | ORAL | Status: AC
  Administered 2016-07-02: 60 g via ORAL
  Filled 2016-07-02: qty 240

## 2016-07-02 MED ORDER — SODIUM CHLORIDE 0.9 % IV SOLN: 250.0000 mg | Freq: Every day | INTRAVENOUS | Status: DC

## 2016-07-02 MED ORDER — SODIUM CHLORIDE 0.9 % IV SOLN
250.0000 mg | Freq: Every day | INTRAVENOUS | Status: DC
  Administered 2016-07-02 – 2016-07-03 (×2): 250 mg via INTRAVENOUS
  Filled 2016-07-02 (×4): qty 2

## 2016-07-02 MED ORDER — IPRATROPIUM-ALBUTEROL 0.5-2.5 (3) MG/3ML IN SOLN
3.0000 mL | Freq: Two times a day (BID) | RESPIRATORY_TRACT | Status: DC
  Administered 2016-07-03 (×2): 3 mL via RESPIRATORY_TRACT
  Filled 2016-07-02 (×2): qty 3

## 2016-07-02 MED ORDER — INSULIN ASPART 100 UNIT/ML IV SOLN
10.0000 [IU] | Freq: Once | INTRAVENOUS | Status: AC
  Administered 2016-07-02: 10 [IU] via INTRAVENOUS
  Filled 2016-07-02: qty 0.1

## 2016-07-02 NOTE — Progress Notes (Signed)
Subjective:    Patient reports pain as 1 on 0-10 scale. He has now developed upper extremity weakness,which was completely normal when admitted and uo to late last week. His daughter stated that his speech is also changed. He had a stroke earlier this week and was on Plavix. I notified his internest and recommended  that Neurology see him again.I will repeat his CPK. I would also recommend a CT of his Brain be done since he cannot fit in the MRI machine.   Objective: Vital signs in last 24 hours: Temp:  [98.1 F (36.7 C)-98.7 F (37.1 C)] 98.4 F (36.9 C) (12/18 0436) Pulse Rate:  [89-115] 115 (12/18 0436) Resp:  [20] 20 (12/17 2047) BP: (122-152)/(78-81) 152/78 (12/18 0436) SpO2:  [92 %-95 %] 92 % (12/18 0436) Weight:  [124.3 kg (274 lb 0.5 oz)] 124.3 kg (274 lb 0.5 oz) (12/17 1844)  Intake/Output from previous day: 12/17 0701 - 12/18 0700 In: 660 [P.O.:660] Out: 1352 [Urine:1350; Stool:2] Intake/Output this shift: No intake/output data recorded.   Recent Labs  07/02/16 0550  HGB 13.2    Recent Labs  07/02/16 0550  WBC 13.1*  RBC 4.80  HCT 37.8*  PLT 343    Recent Labs  06/30/16 0521 07/01/16 0540 07/01/16 0848 07/01/16 1614  NA 143 143  --   --   K 4.4 5.3* 5.9* 5.5*  CL 97* 104  --   --   CO2 33* 26  --   --   BUN 42* 54*  --   --   CREATININE 1.51* 1.61*  --   --   GLUCOSE 171* 80  --   --   CALCIUM 8.9 9.1  --   --    No results for input(s): LABPT, INR in the last 72 hours.  Diffuse weakness of his upper and lower extremities and some slyrring of his speech.  Assessment/Plan:    Up with therapy  Vallie Teters A 07/02/2016, 7:15 AM
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
## 2016-07-02 NOTE — Care Management Important Message (Signed)
Important Message  Patient Details  Name: INDRA OKELLEY MRN: [id number] Date of Birth: 03/28/1951   Medicare Important Message Given:  Yes    Kerin Salen 07/02/2016, 1:59 PMImportant Message  Patient Details  Name: ENZO SIMONIN MRN: [id number] Date of Birth: May 07, 1951   Medicare Important Message Given:  Yes    Kerin Salen 07/02/2016, 1:58 PM
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
## 2016-07-02 NOTE — Progress Notes (Signed)
NIF -56 VC 1.7L. Patient gave good effort, follows commands well and used proper technique.

## 2016-07-02 NOTE — Progress Notes (Signed)
Subjective: Patient continues to feel weaker and no this weakness has spread up to his UE.   Exam: Vitals:   07/01/16 2047 07/02/16 0436  BP: 122/80 (!) 152/78  Pulse: (!) 115 (!) 115  Resp: 20   Temp: 98.7 F (37.1 C) 98.4 F (36.9 C)       Mental Status: Alert, oriented, thought content appropriate.  Speech dysarthric without evidence of aphasia.  Able to follow 3 step commands without difficulty. Cranial Nerves: II: visual fields grossly normal, pupils equal, round, reactive to light and accommodation III,IV, VI: ptosis not present, extraocular muscles extra-ocular motions intact bilaterally V,VII: smile symmetric, facial light touch sensation normal bilaterally VIII: hearing normal bilaterally IX,X: gag reflex present XI: trapezius strength/neck flexion strength normal bilaterally XII: tongue strength normal  Motor: Right : Upper extremity    Left:     Upper extremity 1/5 deltoid       1/5 deltoid 4/5 tricep      4/5 tricep 4/5 biceps      4/5 biceps  5/5wrist flexion     5/5 wrist flexion 5/5 wrist extension     5/5 wrist extension 5/5 hand grip      5/5 hand grip  Lower extremity     Lower extremity 2/5 hip flexor      2/5 hip flexor 3/5 hip adductors     3/5 hip adductors 4/5 hip abductors     4/5 hip abductors 4/5 quadricep      4/5 quadriceps  4/5 hamstrings     4/5 hamstrings 5/5 plantar flexion       5/5 plantar flexion 5/5 plantar extension     5/5 plantar extension Tone and bulk:normal tone throughout; no atrophy noted Sensory: decreased sensation bilateral LE up to knees Deep Tendon Reflexes:  1+ bilateral UE with no LE reflexes Plantars: Mute bilaterally Cerebellar: Unable to take part in due to weaknessn  Pertinent Labs/Diagnostics: CK had improved yesterday to 40,113 but today 47,040 K+ 6.1 BUN 93 Cr 3.66 Ca+ 8.5 AST 838 ALT [id number] Etta Quill PA-C [facility] [phone]  Impression:  65 yo M with fulminant  rhabdomyolysis of unclear etiology. ESR and CRP are slightly elevated raising concern for inflammatory cause. Polymyositis and dermatomyositis are not typically this fulminant. Statin induced necrotizing myopathy can present like this.  Viral or post-viral myositis would be possible as well.   Given the rate of his decline, I Would favor starting steroids, frequent NIF.   Recommendations: 1) anti-hmgcoa reductase antibodies. 2) NIF and VC Q12H 3) Solumedrol, will do high dose(217m daily) for 5 days.  4) continue supporitve care 5) We will follow.   MRoland Rack MD [facility] [phone] If 7pm- 7am, please page neurology [place]   07/02/2016, 10:26 AM

## 2016-07-02 NOTE — Progress Notes (Signed)
NIF - 40; VC 1.3 L; patient appears weak, but demonstrated best effort.

## 2016-07-03 DIAGNOSIS — N179 Acute kidney failure, unspecified: Secondary | ICD-10-CM

## 2016-07-03 LAB — URINALYSIS, ROUTINE W REFLEX MICROSCOPIC
BILIRUBIN URINE: NEGATIVE
Glucose, UA: 50 mg/dL — AB
Ketones, ur: NEGATIVE mg/dL
NITRITE: NEGATIVE
PH: 6 (ref 5.0–8.0)
Protein, ur: 100 mg/dL — AB
SPECIFIC GRAVITY, URINE: 1.011 (ref 1.005–1.030)
Squamous Epithelial / LPF: NONE SEEN

## 2016-07-03 LAB — COMPREHENSIVE METABOLIC PANEL
ALK PHOS: 51 U/L (ref 38–126)
ALT: 315 U/L — AB (ref 17–63)
AST: 829 U/L — AB (ref 15–41)
Albumin: 2.9 g/dL — ABNORMAL LOW (ref 3.5–5.0)
Anion gap: 21 — ABNORMAL HIGH (ref 5–15)
BUN: 129 mg/dL — AB (ref 6–20)
CALCIUM: 7.6 mg/dL — AB (ref 8.9–10.3)
CO2: 28 mmol/L (ref 22–32)
CREATININE: 5.86 mg/dL — AB (ref 0.61–1.24)
Chloride: 90 mmol/L — ABNORMAL LOW (ref 101–111)
GFR calc non Af Amer: 9 mL/min — ABNORMAL LOW (ref >60)
GFR, EST AFRICAN AMERICAN: 11 mL/min — AB (ref >60)
GLUCOSE: 286 mg/dL — AB (ref 65–99)
Potassium: 5.7 mmol/L — ABNORMAL HIGH (ref 3.5–5.1)
SODIUM: 139 mmol/L (ref 135–145)
Total Bilirubin: 1 mg/dL (ref 0.3–1.2)
Total Protein: 6.6 g/dL (ref 6.5–8.1)

## 2016-07-03 LAB — BLOOD GAS, ARTERIAL
ACID-BASE EXCESS: 6.6 mmol/L — AB (ref 0.0–2.0)
BICARBONATE: 31.5 mmol/L — AB (ref 20.0–28.0)
DRAWN BY: 103701
FIO2: 21
O2 Saturation: 79.7 %
PCO2 ART: 49.3 mmHg — AB (ref 32.0–48.0)
PH ART: 7.427 (ref 7.350–7.450)
Patient temperature: 100.5
pO2, Arterial: 54.6 mmHg — ABNORMAL LOW (ref 83.0–108.0)

## 2016-07-03 LAB — CK: Total CK: >50000 U/L — ABNORMAL HIGH (ref 49–397)

## 2016-07-03 LAB — GLUCOSE, CAPILLARY
GLUCOSE-CAPILLARY: 142 mg/dL — AB (ref 65–99)
Glucose-Capillary: 250 mg/dL — ABNORMAL HIGH (ref 65–99)
Glucose-Capillary: 216 mg/dL — ABNORMAL HIGH (ref 65–99)
Glucose-Capillary: 122 mg/dL — ABNORMAL HIGH (ref 65–99)

## 2016-07-03 MED ORDER — BOOST / RESOURCE BREEZE PO LIQD
1.0000 | Freq: Three times a day (TID) | ORAL | Status: DC
  Administered 2016-07-03: 1 via ORAL

## 2016-07-03 MED ORDER — DEXTROSE 50 % IV SOLN
1.0000 | Freq: Once | INTRAVENOUS | Status: AC
  Administered 2016-07-03: 50 mL via INTRAVENOUS
  Filled 2016-07-03: qty 50

## 2016-07-03 MED ORDER — INSULIN ASPART 100 UNIT/ML IV SOLN
10.0000 [IU] | Freq: Once | INTRAVENOUS | Status: AC
  Administered 2016-07-03: 10 [IU] via INTRAVENOUS

## 2016-07-03 MED ORDER — DEXTROSE 5 % IV SOLN
1.0000 g | INTRAVENOUS | Status: DC
  Administered 2016-07-03: 1 g via INTRAVENOUS
  Filled 2016-07-03: qty 10

## 2016-07-03 MED ORDER — SODIUM POLYSTYRENE SULFONATE 15 GM/60ML PO SUSP
30.0000 g | Freq: Once | ORAL | Status: AC
  Administered 2016-07-03: 30 g via ORAL
  Filled 2016-07-03: qty 120

## 2016-07-03 NOTE — Progress Notes (Signed)
Subjective: Patient currently obtaining albuterol treatment. Patient appears more confused today he is able to follow all commands.  Exam: Vitals:   07/02/16 2057 07/03/16 0613  BP: 133/71 (!) 157/75  Pulse: 99 (!) 114  Resp: 18 18  Temp: 98.1 F (36.7 C) 98.2 F (36.8 C)        Gen: In bed, NAD MS: Patient is alert, but appears to be taking longer to answer questions. CN: Cranial nerves II through XII are grossly intact, no difficulty with swallowing, currently stating he has no difficulty with respirations. Motor: Right :  Upper extremity                                              Left:     Upper extremity 1/5 deltoid                                                                   1/5 deltoid 4/5 tricep                                                                     4/5 tricep 4/5 biceps                                                                    4/5 biceps  5/5wrist flexion                                                            5/5 wrist flexion 5/5 wrist extension                                                      5/5 wrist extension 5/5 hand grip                                                               5/5 hand grip             Lower extremity  Lower extremity 2/5 hip flexor                                                               2/5 hip flexor 3/5 hip adductors                                                        3/5 hip adductors 4/5 hip abductors                                                        4/5 hip abductors 4/5 quadricep                                                              4/5 quadriceps  4/5 hamstrings                                                            4/5 hamstrings 5/5 plantar flexion                                                       5/5 plantar flexion 5/5 plantar extension                                                  5/5 plantar extension Tone  and bulk:normal tone throughout; no atrophy noted Sensory: decreased sensation bilateral LE up to knees Deep Tendon Reflexes:  1+ bilateral UE with no LE reflexes Plantars: Mute bilaterally Cerebellar: Unable to take part in due to weaknessn  Pertinent Labs/Diagnostics: Potassium 5.7 Chloride 90 Glucose 286 B UN 129 Creatinine 5.86 Calcium 7.6 AST 829 ALT 315 CK >50,000   Etta Quill PA-C [facility] [phone]  Impression: 65 yo M with fulminant rhabdomyolysis of unclear etiology. Polymyositis and dermatomyositis are not typically this fulminant. Statin induced necrotizing myopathy can present like this.  Viral or post-viral myositis would be possible as well. Labs overnight changed significantly as far as renal function as above. CK also increased. Nephrology also is involved at this time recommending to reduce IVF and discussing potential need for dialysis. Currently on methylprednisolone 250 mg daily  Recommendations: 1) anti-hmgcoa reductase antibodies. 2) NIF and VC Q12H 3) Solumedrol, will do high dose(250mg  daily) for 5  days.  4) continue supporitve care 5) We will follow.   Dr. Leonel Ramsay to follow and right addendum     07/03/2016, 9:18 AM

## 2016-07-03 NOTE — Progress Notes (Signed)
Inpatient Diabetes Program Recommendations  AACE/ADA: New Consensus Statement on Inpatient Glycemic Control (2015)  Target Ranges:  Prepandial:   less than 140 mg/dL      Peak postprandial:   less than 180 mg/dL (1-2 hours)      Critically ill patients:  140 - 180 mg/dL   Lab Results  Component Value Date   GLUCAP 250 (H) 07/03/2016   HGBA1C 12.3 05/25/2016    Review of Glycemic Control  Blood sugars > 180 mg/dL. Needs insulin adjustment. Pt on Lantus 55 units QHS at home.  Inpatient Diabetes Program Recommendations: Increase Lantus  to 20 units bid. Add Novolog 3 units tidwc if pt eats > 50% meal.  Will continue to follow. Will need medication adjustment prior to d/c with extremely elevated HgbA1C.  Continue to follow.  Thank you. Lorenda Peck, RD, LDN, CDE Inpatient Diabetes Coordinator [phone]

## 2016-07-03 NOTE — Progress Notes (Signed)
Pharmacy Antibiotic Note  Derrick [name] is a 65 y.o. male admitted on 06/19/2016 with UTI.  Pharmacy has been consulted for Ceftriaxone dosing.  Plan:  Ceftriaxone 1g IV q24h  Dosage remains stable and need for further dosage adjustment appears unlikely at present.  Pharmacy will sign off at this time.  Please reconsult if a change in clinical status warrants re-evaluation of dosage.   Height: 5\' 8"  (172.7 cm) Weight: 275 lb 9.2 oz (125 kg) IBW/kg (Calculated) : 68.4  Temp (24hrs), Avg:98.9 F (37.2 C), Min:98.1 F (36.7 C), Max:100.5 F (38.1 C)   Recent Labs Lab 06/27/16 0903 06/28/16 0544  06/29/16 0537 06/30/16 0521 07/01/16 0540 07/02/16 0550 07/02/16 1705 07/03/16 0532  WBC 10.2 11.0*  --  10.4  --   --  13.1*  --   --   CREATININE 2.03* 2.13*  < > 1.61* 1.51* 1.61* 3.66* 4.70* 5.86*  < > = values in this interval not displayed.  Estimated Creatinine Clearance: 16.2 mL/min (by C-G formula based on SCr of 5.86 mg/dL (H)).    Allergies  Allergen Reactions  . Ivp Dye [Iodinated Diagnostic Agents] Nausea And Vomiting  . Lasix [Furosemide] Nausea And Vomiting    Thank you for allowing pharmacy to be a part of this patient's care.  Gretta Arab PharmD, BCPS Pager [phone] 07/03/2016 6:36 PM
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
## 2016-07-03 NOTE — Progress Notes (Signed)
Initial Nutrition Assessment  DOCUMENTATION CODES:   Morbid obesity  INTERVENTION:  - Will order Boost Breeze TID, each supplement provides 250 kcal and 9 grams of protein - Recommend renal-friendly multivitamin.  - Continue to encourage PO intakes of meals and supplements. - RD will continue to monitor for additional nutrition-related needs.  NUTRITION DIAGNOSIS:   Inadequate oral intake related to poor appetite, acute illness as evidenced by per patient/family report, meal completion < 50%.  GOAL:   Patient will meet greater than or equal to 90% of their needs  MONITOR:   PO intake, Supplement acceptance, Weight trends, Labs, I & O's  REASON FOR ASSESSMENT:   Low Braden  ASSESSMENT:   65 y.o. 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 Pt was admitted 1 week ago. Visualized lunch tray with 25-50% completion of chicken noodle soup and grilled cheese sandwich. Per chart review, pt ate 25% of breakfast and lunch yesterday and 35% of breakfast (fed by his daughter) this AM.   Spoke with family at bedside who report that pt's appetite has been greatly decreased since 1-2 days PTA. Pt denies abdominal pain or nausea but states that he does like current diet restriction as it limits his options. When RD asked pt which foods he would like to have he states "it does not matter, it won't happen anyway" and prefers not to talk further on this  topic.   Unable to obtain much information from pt or family at time of visit. Will continue to monitor for needs. Physical assessment shows no muscle or fat wasting at this time, mild edema to extremities. Per chart review, weight has been mainly stable since admission and over the past 1 month. Pt had gained 12 lbs from 10/10-11/10. Will continue to monitor weight trends closely.   Per Surgery PA note today at 1311, consideration for transfer to [facility] 12/20 if renal function does not improve. Nephrology MD note from today at 0912 stated no plans for HD at this time but will continue to monitor for this.   Medications reviewed; PRN Dulcolax, sliding scale Novolog, 10 units Novolog x1 dose today, 15 units Levemir BID, 100 mcg oral Synthroid/day, 250 mg IV Solu-medrol x5 doses starting yesterday at 1630, PRN Zofran, 17 g Miralax BID, 1 tablet Senokot BID, 30 mg Kayexalate x1 dose today.  Labs reviewed; CBGs: 216 and 250 mg/dL today, K: 5.7 mmol/L, Cl: 90 mmol/L, BUN: 129 mg/dL, creatinine: 5.86 mg/dL, Ca: 7.6 mg/dL, Mg: 2.6 mg/dL, AST/ALT elevated, GFR: 11 mL/min.   IVF: D5-150 mEq sodium bicarb @ 75 mL/hr (306 kcal, 90 grams carbohydrate).   Diet Order:  Diet renal/carb modified with fluid restriction Diet-HS Snack? Nothing; Room service appropriate? Yes; Fluid consistency: Thin  Skin:  Reviewed, no issues  Last BM:  12/19  Height:   Ht Readings from Last 1 Encounters:  07/14/2016 5\' 8"  (1.727 m)    Weight:   Wt Readings from Last 1 Encounters:  07/03/16 275  lb 9.2 oz (125 kg)    Ideal Body Weight:  70 kg  BMI:  Body mass index is 41.9 kg/m.  Estimated Nutritional Needs:   Kcal:  2125-2375 (17-19 kcal/kg)  Protein:  75-100 grams (06.-0.8 grams/kg)  Fluid:  Per Nephrology given renal function  EDUCATION NEEDS:   No education needs identified at this time    Jarome Matin, MS, RD, LDN, [place] Inpatient Clinical Dietitian Pager # [phone] After hours/weekend pager #  [phone]

## 2016-07-03 NOTE — Procedures (Signed)
NIF (-20) and VC (.85L) obtained from pt.  Pt having trouble forming a seal around filter.
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
## 2016-07-03 NOTE — Progress Notes (Signed)
PT Cancellation Note  Patient Details Name: KHALIB KERNS MRN: [id number] DOB: 01/15/1951   Cancelled Treatment:    Reason Eval/Treat Not Completed: Medical issues which prohibited therapy (multiple medical issues, increased weakness. will check back another time.)   Claretha Cooper 07/03/2016, 4:45 PM Tresa Endo PT [phone]
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
## 2016-07-03 NOTE — Progress Notes (Signed)
RT attempted to obtain NIF/VC, but patient not able to stay awake long enough to perform appropriately. Patient is more awake than yesterday, but unable to stay awake for more than a brief time. RT will attempt to obtain values at around 12p today. Dr. Wyline Copas aware.

## 2016-07-04 ENCOUNTER — Inpatient Hospital Stay (HOSPITAL_COMMUNITY): Payer: Medicare Other | Admitting: Certified Registered"

## 2016-07-04 DIAGNOSIS — M5126 Other intervertebral disc displacement, lumbar region: Secondary | ICD-10-CM

## 2016-07-04 LAB — BLOOD CULTURE ID PANEL (REFLEXED)
Acinetobacter baumannii: NOT DETECTED
CANDIDA ALBICANS: NOT DETECTED
CANDIDA PARAPSILOSIS: NOT DETECTED
Candida glabrata: NOT DETECTED
Candida krusei: NOT DETECTED
Candida tropicalis: NOT DETECTED
Enterobacter cloacae complex: NOT DETECTED
Enterobacteriaceae species: NOT DETECTED
Enterococcus species: NOT DETECTED
Escherichia coli: NOT DETECTED
HAEMOPHILUS INFLUENZAE: NOT DETECTED
KLEBSIELLA OXYTOCA: NOT DETECTED
KLEBSIELLA PNEUMONIAE: NOT DETECTED
Listeria monocytogenes: NOT DETECTED
METHICILLIN RESISTANCE: NOT DETECTED
NEISSERIA MENINGITIDIS: NOT DETECTED
PROTEUS SPECIES: NOT DETECTED
Pseudomonas aeruginosa: NOT DETECTED
SERRATIA MARCESCENS: NOT DETECTED
STAPHYLOCOCCUS AUREUS BCID: NOT DETECTED
STAPHYLOCOCCUS SPECIES: DETECTED — AB
STREPTOCOCCUS PYOGENES: NOT DETECTED
STREPTOCOCCUS SPECIES: NOT DETECTED
Streptococcus agalactiae: NOT DETECTED
Streptococcus pneumoniae: NOT DETECTED

## 2016-07-04 MED FILL — Medication: Qty: 1 | Status: AC

## 2016-07-05 LAB — URINE CULTURE: CULTURE: NO GROWTH

## 2016-07-06 LAB — CULTURE, BLOOD (ROUTINE X 2)

## 2016-07-08 LAB — CULTURE, BLOOD (ROUTINE X 2): CULTURE: NO GROWTH
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
## 2016-07-16 NOTE — Progress Notes (Signed)
Noticed pt pulse start to decrease on monitor. Went to check on pt. Pt. unreponsive . Central tele phoned to say pt. Was now asystole. Code blue called chest compression started epinephrine X 5 given, bicarb x 1, calcuim cholride x1 pt. entubated per respiratory. Code called [id number]. Family notified.

## 2016-07-16 NOTE — ED Provider Notes (Signed)
CPR Procedure Note I PERSONALLY DIRECTED ANCILLARY STAFF OR/PERFORMED CPR IN AN EFFORT TO REGAIN RETURN OF SPONTANEOUS CIRCULATION IN AN EFFORT TO MAINTAIN NEURO, CARDIAC AND SYSTEMIC PERFUSION   I was called to room due to CPR in progress/code blue Pt with h/o herniated disc, but also acute renal failure Patient was intubated by CRNA without difficulty - see her note Pt received 5 epinephrine He also received calcium/bicarb/dextrose CPR continued for up to 20 minutes he remained asystolic without return of spontaneous circulation.   No spontaneous cardiac activity Pt pronounced Time of death 55 D/w dr Hal Hope attending over this patient, he will speak to family    Ripley Fraise, MD Jul 08, 2016 [phone]

## 2016-07-16 NOTE — Progress Notes (Addendum)
Pt transferred to morgue. Bed control informed funeral home [facility] of pt. Family has been notified that pt has been transferred to [facility].  Brennon Otterness W Marlina Cataldi, RN

## 2016-07-16 NOTE — Anesthesia Procedure Notes (Signed)
Procedure Name: Intubation Date/Time: 07-06-2016 3:17 AM Performed by: Lajuana Carry E Pre-anesthesia Checklist: Patient identified, Emergency Drugs available, Suction available and Patient being monitored Patient Re-evaluated:Patient Re-evaluated prior to inductionOxygen Delivery Method: Ambu bag Preoxygenation: Pre-oxygenation with 100% oxygen Ventilation: Mask ventilation without difficulty Laryngoscope Size: Miller and 3 Grade View: Grade I Tube type: Oral Tube size: 7.5 mm Number of attempts: 1 Airway Equipment and Method: Stylet Placement Confirmation: ETT inserted through vocal cords under direct vision,  CO2 detector and breath sounds checked- equal and bilateral Secured at: 24 cm Tube secured with: Tape Dental Injury: Teeth and Oropharynx as per pre-operative assessment
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
## 2016-07-16 DEATH — deceased
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
